# Patient Record
Sex: Male | Born: 1955 | Race: White | Hispanic: No | Marital: Single | Smoking: Current every day smoker
Health system: Southern US, Community
[De-identification: ages and names within clinical notes are randomized; demographics above are authoritative.]

## PROBLEM LIST (undated history)

## (undated) DIAGNOSIS — K219 Gastro-esophageal reflux disease without esophagitis: Secondary | ICD-10-CM

## (undated) DIAGNOSIS — R51 Headache: Secondary | ICD-10-CM

## (undated) DIAGNOSIS — Z860100 Personal history of colon polyps, unspecified: Secondary | ICD-10-CM

## (undated) DIAGNOSIS — R0601 Orthopnea: Secondary | ICD-10-CM

## (undated) DIAGNOSIS — H9190 Unspecified hearing loss, unspecified ear: Secondary | ICD-10-CM

## (undated) DIAGNOSIS — R1310 Dysphagia, unspecified: Secondary | ICD-10-CM

## (undated) DIAGNOSIS — J42 Unspecified chronic bronchitis: Secondary | ICD-10-CM

## (undated) DIAGNOSIS — Z8659 Personal history of other mental and behavioral disorders: Secondary | ICD-10-CM

## (undated) DIAGNOSIS — J439 Emphysema, unspecified: Secondary | ICD-10-CM

## (undated) DIAGNOSIS — F329 Major depressive disorder, single episode, unspecified: Secondary | ICD-10-CM

## (undated) DIAGNOSIS — R519 Headache, unspecified: Secondary | ICD-10-CM

## (undated) DIAGNOSIS — M1990 Unspecified osteoarthritis, unspecified site: Secondary | ICD-10-CM

## (undated) DIAGNOSIS — S02401A Maxillary fracture, unspecified, initial encounter for closed fracture: Secondary | ICD-10-CM

## (undated) DIAGNOSIS — R918 Other nonspecific abnormal finding of lung field: Secondary | ICD-10-CM

## (undated) DIAGNOSIS — K2270 Barrett's esophagus without dysplasia: Secondary | ICD-10-CM

## (undated) DIAGNOSIS — R42 Dizziness and giddiness: Secondary | ICD-10-CM

## (undated) DIAGNOSIS — I10 Essential (primary) hypertension: Secondary | ICD-10-CM

## (undated) DIAGNOSIS — F419 Anxiety disorder, unspecified: Secondary | ICD-10-CM

## (undated) DIAGNOSIS — IMO0001 Reserved for inherently not codable concepts without codable children: Secondary | ICD-10-CM

## (undated) DIAGNOSIS — C801 Malignant (primary) neoplasm, unspecified: Secondary | ICD-10-CM

## (undated) DIAGNOSIS — N19 Unspecified kidney failure: Secondary | ICD-10-CM

## (undated) DIAGNOSIS — I739 Peripheral vascular disease, unspecified: Secondary | ICD-10-CM

## (undated) DIAGNOSIS — I82409 Acute embolism and thrombosis of unspecified deep veins of unspecified lower extremity: Secondary | ICD-10-CM

## (undated) DIAGNOSIS — F32A Depression, unspecified: Secondary | ICD-10-CM

## (undated) DIAGNOSIS — I83813 Varicose veins of bilateral lower extremities with pain: Secondary | ICD-10-CM

## (undated) DIAGNOSIS — R569 Unspecified convulsions: Secondary | ICD-10-CM

## (undated) DIAGNOSIS — R059 Cough, unspecified: Secondary | ICD-10-CM

## (undated) DIAGNOSIS — J449 Chronic obstructive pulmonary disease, unspecified: Secondary | ICD-10-CM

## (undated) DIAGNOSIS — R05 Cough: Secondary | ICD-10-CM

## (undated) DIAGNOSIS — T7840XA Allergy, unspecified, initial encounter: Secondary | ICD-10-CM

## (undated) DIAGNOSIS — R0902 Hypoxemia: Secondary | ICD-10-CM

## (undated) DIAGNOSIS — J189 Pneumonia, unspecified organism: Secondary | ICD-10-CM

## (undated) DIAGNOSIS — Z8719 Personal history of other diseases of the digestive system: Secondary | ICD-10-CM

## (undated) DIAGNOSIS — R490 Dysphonia: Secondary | ICD-10-CM

## (undated) DIAGNOSIS — R062 Wheezing: Secondary | ICD-10-CM

## (undated) DIAGNOSIS — Z8709 Personal history of other diseases of the respiratory system: Secondary | ICD-10-CM

## (undated) HISTORY — DX: Peripheral vascular disease, unspecified: I73.9

## (undated) HISTORY — DX: Anxiety disorder, unspecified: F41.9

## (undated) HISTORY — PX: EYE SURGERY: SHX253

## (undated) HISTORY — DX: Varicose veins of bilateral lower extremities with pain: I83.813

## (undated) HISTORY — DX: Personal history of other mental and behavioral disorders: Z86.59

## (undated) HISTORY — DX: Allergy, unspecified, initial encounter: T78.40XA

## (undated) HISTORY — DX: Essential (primary) hypertension: I10

---

## 1978-08-30 HISTORY — PX: FACIAL RECONSTRUCTION SURGERY: SHX631

## 2005-03-22 ENCOUNTER — Emergency Department: Payer: Self-pay | Admitting: Emergency Medicine

## 2005-03-25 ENCOUNTER — Ambulatory Visit: Payer: Self-pay | Admitting: Emergency Medicine

## 2005-04-08 ENCOUNTER — Ambulatory Visit: Payer: Self-pay | Admitting: Internal Medicine

## 2005-04-14 ENCOUNTER — Ambulatory Visit: Payer: Self-pay | Admitting: Internal Medicine

## 2005-06-21 ENCOUNTER — Other Ambulatory Visit: Payer: Self-pay

## 2005-06-21 ENCOUNTER — Inpatient Hospital Stay: Payer: Self-pay | Admitting: Internal Medicine

## 2005-08-25 ENCOUNTER — Ambulatory Visit (HOSPITAL_COMMUNITY): Admission: RE | Admit: 2005-08-25 | Discharge: 2005-08-25 | Payer: Self-pay | Admitting: Family Medicine

## 2008-08-31 ENCOUNTER — Inpatient Hospital Stay: Payer: Self-pay | Admitting: Internal Medicine

## 2008-11-06 ENCOUNTER — Ambulatory Visit: Payer: Self-pay | Admitting: Nurse Practitioner

## 2008-12-20 ENCOUNTER — Ambulatory Visit: Payer: Self-pay | Admitting: Gastroenterology

## 2011-10-25 ENCOUNTER — Ambulatory Visit: Payer: Self-pay | Admitting: Internal Medicine

## 2011-12-16 ENCOUNTER — Ambulatory Visit: Payer: Self-pay | Admitting: Gastroenterology

## 2012-01-05 ENCOUNTER — Ambulatory Visit: Payer: Self-pay | Admitting: Gastroenterology

## 2013-01-31 ENCOUNTER — Emergency Department: Payer: Self-pay | Admitting: Emergency Medicine

## 2013-01-31 LAB — COMPREHENSIVE METABOLIC PANEL
Albumin: 3.4 g/dL (ref 3.4–5.0)
Anion Gap: 7 (ref 7–16)
Bilirubin,Total: 0.5 mg/dL (ref 0.2–1.0)
Calcium, Total: 9 mg/dL (ref 8.5–10.1)
EGFR (African American): >60
EGFR (Non-African Amer.): >60
Potassium: 2.6 mmol/L — ABNORMAL LOW (ref 3.5–5.1)
Sodium: 137 mmol/L (ref 136–145)

## 2013-01-31 LAB — CBC
HCT: 45.6 % (ref 40.0–52.0)
MCH: 31.6 pg (ref 26.0–34.0)
MCV: 92 fL (ref 80–100)
RBC: 4.94 10*6/uL (ref 4.40–5.90)
RDW: 15.2 % — ABNORMAL HIGH (ref 11.5–14.5)
WBC: 15.4 10*3/uL — ABNORMAL HIGH (ref 3.8–10.6)

## 2013-01-31 LAB — URINALYSIS, COMPLETE
Bacteria: NONE SEEN
Glucose,UR: NEGATIVE mg/dL (ref 0–75)
Ketone: NEGATIVE
Protein: NEGATIVE

## 2013-01-31 LAB — LIPASE, BLOOD: Lipase: 232 U/L (ref 73–393)

## 2013-10-05 ENCOUNTER — Other Ambulatory Visit: Payer: Self-pay | Admitting: Gastroenterology

## 2013-10-05 LAB — CLOSTRIDIUM DIFFICILE(ARMC)

## 2013-10-09 LAB — STOOL CULTURE

## 2013-10-30 ENCOUNTER — Ambulatory Visit: Payer: Self-pay | Admitting: Gastroenterology

## 2013-11-01 ENCOUNTER — Ambulatory Visit: Payer: Self-pay | Admitting: Internal Medicine

## 2013-11-01 LAB — PATHOLOGY REPORT

## 2014-01-31 ENCOUNTER — Inpatient Hospital Stay: Payer: Self-pay

## 2014-01-31 LAB — COMPREHENSIVE METABOLIC PANEL
ALBUMIN: 2.1 g/dL — AB (ref 3.4–5.0)
ALK PHOS: 79 U/L
ANION GAP: 5 — AB (ref 7–16)
BILIRUBIN TOTAL: 0.8 mg/dL (ref 0.2–1.0)
BUN: 22 mg/dL — AB (ref 7–18)
CALCIUM: 8.3 mg/dL — AB (ref 8.5–10.1)
Chloride: 89 mmol/L — ABNORMAL LOW (ref 98–107)
Co2: 40 mmol/L (ref 21–32)
Creatinine: 1.01 mg/dL (ref 0.60–1.30)
EGFR (African American): >60
EGFR (Non-African Amer.): >60
GLUCOSE: 147 mg/dL — AB (ref 65–99)
Osmolality: 274 (ref 275–301)
POTASSIUM: 2.3 mmol/L — AB (ref 3.5–5.1)
SGOT(AST): 22 U/L (ref 15–37)
SGPT (ALT): 12 U/L (ref 12–78)
SODIUM: 134 mmol/L — AB (ref 136–145)
TOTAL PROTEIN: 7.2 g/dL (ref 6.4–8.2)

## 2014-01-31 LAB — CBC
HCT: 40.7 % (ref 40.0–52.0)
HGB: 13.5 g/dL (ref 13.0–18.0)
MCH: 30.5 pg (ref 26.0–34.0)
MCHC: 33.2 g/dL (ref 32.0–36.0)
MCV: 92 fL (ref 80–100)
Platelet: 258 10*3/uL (ref 150–440)
RBC: 4.43 10*6/uL (ref 4.40–5.90)
RDW: 14.9 % — AB (ref 11.5–14.5)
WBC: 27.8 10*3/uL — AB (ref 3.8–10.6)

## 2014-01-31 LAB — PROTIME-INR
INR: 1.2
Prothrombin Time: 14.9 secs — ABNORMAL HIGH (ref 11.5–14.7)

## 2014-01-31 LAB — MAGNESIUM: Magnesium: 2.9 mg/dL — ABNORMAL HIGH

## 2014-01-31 LAB — TROPONIN I: Troponin-I: 0.04 ng/mL

## 2014-01-31 LAB — PRO B NATRIURETIC PEPTIDE: B-TYPE NATIURETIC PEPTID: 6953 pg/mL — AB (ref 0–125)

## 2014-02-01 LAB — CBC WITH DIFFERENTIAL/PLATELET
BASOS ABS: 0 10*3/uL (ref 0.0–0.1)
BASOS PCT: 0.1 %
EOS ABS: 0 10*3/uL (ref 0.0–0.7)
EOS PCT: 0.1 %
HCT: 36.7 % — AB (ref 40.0–52.0)
HGB: 11.7 g/dL — ABNORMAL LOW (ref 13.0–18.0)
LYMPHS ABS: 1.2 10*3/uL (ref 1.0–3.6)
LYMPHS PCT: 5.6 %
MCH: 29.7 pg (ref 26.0–34.0)
MCHC: 32 g/dL (ref 32.0–36.0)
MCV: 93 fL (ref 80–100)
MONO ABS: 0.8 x10 3/mm (ref 0.2–1.0)
MONOS PCT: 3.7 %
NEUTROS ABS: 20.1 10*3/uL — AB (ref 1.4–6.5)
NEUTROS PCT: 90.5 %
Platelet: 121 10*3/uL — ABNORMAL LOW (ref 150–440)
RBC: 3.95 10*6/uL — AB (ref 4.40–5.90)
RDW: 14.7 % — AB (ref 11.5–14.5)
WBC: 22.2 10*3/uL — AB (ref 3.8–10.6)

## 2014-02-01 LAB — BASIC METABOLIC PANEL
ANION GAP: 5 — AB (ref 7–16)
BUN: 20 mg/dL — AB (ref 7–18)
Calcium, Total: 7.8 mg/dL — ABNORMAL LOW (ref 8.5–10.1)
Chloride: 94 mmol/L — ABNORMAL LOW (ref 98–107)
Co2: 38 mmol/L — ABNORMAL HIGH (ref 21–32)
Creatinine: 1.05 mg/dL (ref 0.60–1.30)
Glucose: 151 mg/dL — ABNORMAL HIGH (ref 65–99)
OSMOLALITY: 279 (ref 275–301)
POTASSIUM: 3.1 mmol/L — AB (ref 3.5–5.1)
SODIUM: 137 mmol/L (ref 136–145)

## 2014-02-01 LAB — RAPID HIV-1/2 QL/CONFIRM: HIV-1/2, RAPID QL: NEGATIVE

## 2014-02-01 LAB — MAGNESIUM: Magnesium: 3.3 mg/dL — ABNORMAL HIGH

## 2014-02-02 LAB — LACTATE DEHYDROGENASE: LDH: 223 U/L (ref 85–241)

## 2014-02-02 LAB — BASIC METABOLIC PANEL
Anion Gap: 3 — ABNORMAL LOW (ref 7–16)
BUN: 20 mg/dL — AB (ref 7–18)
CALCIUM: 8.1 mg/dL — AB (ref 8.5–10.1)
CO2: 37 mmol/L — AB (ref 21–32)
CREATININE: 0.97 mg/dL (ref 0.60–1.30)
Chloride: 99 mmol/L (ref 98–107)
EGFR (African American): >60
GLUCOSE: 158 mg/dL — AB (ref 65–99)
OSMOLALITY: 283 (ref 275–301)
POTASSIUM: 3.2 mmol/L — AB (ref 3.5–5.1)
Sodium: 139 mmol/L (ref 136–145)

## 2014-02-02 LAB — CBC WITH DIFFERENTIAL/PLATELET
Bands: 7 %
HCT: 36.9 % — AB (ref 40.0–52.0)
HGB: 11.7 g/dL — AB (ref 13.0–18.0)
Lymphocytes: 10 %
MCH: 29.4 pg (ref 26.0–34.0)
MCHC: 31.6 g/dL — AB (ref 32.0–36.0)
MCV: 93 fL (ref 80–100)
MYELOCYTE: 2 %
Metamyelocyte: 1 %
NRBC/100 WBC: 3 /
PLATELETS: 90 10*3/uL — AB (ref 150–440)
RBC: 3.97 10*6/uL — AB (ref 4.40–5.90)
RDW: 14.9 % — AB (ref 11.5–14.5)
SEGMENTED NEUTROPHILS: 80 %
WBC: 20.3 10*3/uL — AB (ref 3.8–10.6)

## 2014-02-03 LAB — CBC WITH DIFFERENTIAL/PLATELET
BASOS ABS: 0 10*3/uL (ref 0.0–0.1)
BASOS PCT: 0.1 %
EOS ABS: 0 10*3/uL (ref 0.0–0.7)
EOS PCT: 0 %
HCT: 35.1 % — AB (ref 40.0–52.0)
HGB: 11.2 g/dL — ABNORMAL LOW (ref 13.0–18.0)
LYMPHS PCT: 3.9 %
Lymphocyte #: 0.5 10*3/uL — ABNORMAL LOW (ref 1.0–3.6)
MCH: 29.7 pg (ref 26.0–34.0)
MCHC: 32 g/dL (ref 32.0–36.0)
MCV: 93 fL (ref 80–100)
MONO ABS: 0.5 x10 3/mm (ref 0.2–1.0)
MONOS PCT: 3.4 %
Neutrophil #: 12.7 10*3/uL — ABNORMAL HIGH (ref 1.4–6.5)
Neutrophil %: 92.6 %
PLATELETS: 84 10*3/uL — AB (ref 150–440)
RBC: 3.78 10*6/uL — AB (ref 4.40–5.90)
RDW: 14.7 % — AB (ref 11.5–14.5)
WBC: 13.7 10*3/uL — ABNORMAL HIGH (ref 3.8–10.6)

## 2014-02-03 LAB — COMPREHENSIVE METABOLIC PANEL
AST: 15 U/L (ref 15–37)
Albumin: 1.9 g/dL — ABNORMAL LOW (ref 3.4–5.0)
Alkaline Phosphatase: 49 U/L
Anion Gap: 5 — ABNORMAL LOW (ref 7–16)
BILIRUBIN TOTAL: 0.3 mg/dL (ref 0.2–1.0)
BUN: 18 mg/dL (ref 7–18)
CO2: 36 mmol/L — AB (ref 21–32)
Calcium, Total: 7.9 mg/dL — ABNORMAL LOW (ref 8.5–10.1)
Chloride: 101 mmol/L (ref 98–107)
Creatinine: 0.69 mg/dL (ref 0.60–1.30)
EGFR (African American): >60
EGFR (Non-African Amer.): >60
GLUCOSE: 168 mg/dL — AB (ref 65–99)
Osmolality: 289 (ref 275–301)
Potassium: 3.5 mmol/L (ref 3.5–5.1)
SGPT (ALT): 16 U/L (ref 12–78)
SODIUM: 142 mmol/L (ref 136–145)
TOTAL PROTEIN: 5.5 g/dL — AB (ref 6.4–8.2)

## 2014-02-04 LAB — BASIC METABOLIC PANEL
ANION GAP: 5 — AB (ref 7–16)
BUN: 17 mg/dL (ref 7–18)
CO2: 36 mmol/L — AB (ref 21–32)
Calcium, Total: 7.9 mg/dL — ABNORMAL LOW (ref 8.5–10.1)
Chloride: 101 mmol/L (ref 98–107)
Creatinine: 0.87 mg/dL (ref 0.60–1.30)
EGFR (Non-African Amer.): >60
Glucose: 194 mg/dL — ABNORMAL HIGH (ref 65–99)
Osmolality: 290 (ref 275–301)
Potassium: 3.8 mmol/L (ref 3.5–5.1)
SODIUM: 142 mmol/L (ref 136–145)

## 2014-02-04 LAB — CBC WITH DIFFERENTIAL/PLATELET
BASOS ABS: 0 10*3/uL (ref 0.0–0.1)
BASOS PCT: 0.1 %
EOS PCT: 0 %
Eosinophil #: 0 10*3/uL (ref 0.0–0.7)
HCT: 35.5 % — AB (ref 40.0–52.0)
HGB: 11.4 g/dL — ABNORMAL LOW (ref 13.0–18.0)
Lymphocyte #: 0.5 10*3/uL — ABNORMAL LOW (ref 1.0–3.6)
Lymphocyte %: 3.5 %
MCH: 29.6 pg (ref 26.0–34.0)
MCHC: 32.1 g/dL (ref 32.0–36.0)
MCV: 92 fL (ref 80–100)
MONO ABS: 0.4 x10 3/mm (ref 0.2–1.0)
Monocyte %: 3.1 %
NEUTROS PCT: 93.3 %
Neutrophil #: 13.1 10*3/uL — ABNORMAL HIGH (ref 1.4–6.5)
PLATELETS: 99 10*3/uL — AB (ref 150–440)
RBC: 3.85 10*6/uL — AB (ref 4.40–5.90)
RDW: 14.7 % — AB (ref 11.5–14.5)
WBC: 14.1 10*3/uL — AB (ref 3.8–10.6)

## 2014-02-05 LAB — CULTURE, BLOOD (SINGLE)

## 2014-02-05 LAB — THEOPHYLLINE LEVEL: Theophylline: 4.5 ug/mL — ABNORMAL LOW (ref 10.0–20.0)

## 2014-02-06 LAB — EXPECTORATED SPUTUM ASSESSMENT W REFEX TO RESP CULTURE

## 2014-02-07 LAB — BASIC METABOLIC PANEL
ANION GAP: 6 — AB (ref 7–16)
BUN: 21 mg/dL — ABNORMAL HIGH (ref 7–18)
Calcium, Total: 8.3 mg/dL — ABNORMAL LOW (ref 8.5–10.1)
Chloride: 100 mmol/L (ref 98–107)
Co2: 32 mmol/L (ref 21–32)
Creatinine: 0.75 mg/dL (ref 0.60–1.30)
EGFR (African American): >60
EGFR (Non-African Amer.): >60
Glucose: 272 mg/dL — ABNORMAL HIGH (ref 65–99)
Osmolality: 288 (ref 275–301)
POTASSIUM: 4 mmol/L (ref 3.5–5.1)
Sodium: 138 mmol/L (ref 136–145)

## 2014-02-07 LAB — CBC WITH DIFFERENTIAL/PLATELET
BASOS PCT: 0.1 %
Basophil #: 0 10*3/uL (ref 0.0–0.1)
EOS ABS: 0 10*3/uL (ref 0.0–0.7)
Eosinophil %: 0 %
HCT: 39.5 % — AB (ref 40.0–52.0)
HGB: 12.5 g/dL — ABNORMAL LOW (ref 13.0–18.0)
Lymphocyte #: 0.5 10*3/uL — ABNORMAL LOW (ref 1.0–3.6)
Lymphocyte %: 2 %
MCH: 29.2 pg (ref 26.0–34.0)
MCHC: 31.6 g/dL — ABNORMAL LOW (ref 32.0–36.0)
MCV: 93 fL (ref 80–100)
MONO ABS: 0.9 x10 3/mm (ref 0.2–1.0)
MONOS PCT: 3.4 %
NEUTROS ABS: 25.6 10*3/uL — AB (ref 1.4–6.5)
Neutrophil %: 94.5 %
PLATELETS: 134 10*3/uL — AB (ref 150–440)
RBC: 4.27 10*6/uL — AB (ref 4.40–5.90)
RDW: 14.9 % — AB (ref 11.5–14.5)
WBC: 27.1 10*3/uL — AB (ref 3.8–10.6)

## 2014-02-08 LAB — URINALYSIS, COMPLETE
BACTERIA: NONE SEEN
BILIRUBIN, UR: NEGATIVE
Blood: NEGATIVE
Glucose,UR: 150 mg/dL (ref 0–75)
Ketone: NEGATIVE
Leukocyte Esterase: NEGATIVE
Nitrite: NEGATIVE
PH: 6 (ref 4.5–8.0)
PROTEIN: NEGATIVE
RBC,UR: <1 /HPF (ref 0–5)
SPECIFIC GRAVITY: 1.016 (ref 1.003–1.030)
SQUAMOUS EPITHELIAL: NONE SEEN

## 2014-02-09 LAB — CBC WITH DIFFERENTIAL/PLATELET
BASOS ABS: 0 10*3/uL (ref 0.0–0.1)
BASOS PCT: 0.1 %
EOS PCT: 0 %
Eosinophil #: 0 10*3/uL (ref 0.0–0.7)
HCT: 39.3 % — AB (ref 40.0–52.0)
HGB: 12.8 g/dL — ABNORMAL LOW (ref 13.0–18.0)
LYMPHS ABS: 0.7 10*3/uL — AB (ref 1.0–3.6)
LYMPHS PCT: 2.6 %
MCH: 30.1 pg (ref 26.0–34.0)
MCHC: 32.6 g/dL (ref 32.0–36.0)
MCV: 92 fL (ref 80–100)
MONO ABS: 1 x10 3/mm (ref 0.2–1.0)
Monocyte %: 3.5 %
NEUTROS ABS: 26.1 10*3/uL — AB (ref 1.4–6.5)
NEUTROS PCT: 93.8 %
PLATELETS: 148 10*3/uL — AB (ref 150–440)
RBC: 4.26 10*6/uL — ABNORMAL LOW (ref 4.40–5.90)
RDW: 14.9 % — AB (ref 11.5–14.5)
WBC: 27.8 10*3/uL — ABNORMAL HIGH (ref 3.8–10.6)

## 2014-02-09 LAB — URINE CULTURE

## 2014-02-10 LAB — CBC WITH DIFFERENTIAL/PLATELET
BASOS PCT: 0.1 %
Basophil #: 0 10*3/uL (ref 0.0–0.1)
Eosinophil #: 0 10*3/uL (ref 0.0–0.7)
Eosinophil %: 0 %
HCT: 39.1 % — ABNORMAL LOW (ref 40.0–52.0)
HGB: 12.5 g/dL — AB (ref 13.0–18.0)
LYMPHS ABS: 0.7 10*3/uL — AB (ref 1.0–3.6)
Lymphocyte %: 3.3 %
MCH: 29.4 pg (ref 26.0–34.0)
MCHC: 31.8 g/dL — AB (ref 32.0–36.0)
MCV: 92 fL (ref 80–100)
MONO ABS: 0.7 x10 3/mm (ref 0.2–1.0)
Monocyte %: 3.4 %
Neutrophil #: 20.5 10*3/uL — ABNORMAL HIGH (ref 1.4–6.5)
Neutrophil %: 93.2 %
Platelet: 151 10*3/uL (ref 150–440)
RBC: 4.24 10*6/uL — ABNORMAL LOW (ref 4.40–5.90)
RDW: 14.6 % — AB (ref 11.5–14.5)
WBC: 22 10*3/uL — ABNORMAL HIGH (ref 3.8–10.6)

## 2014-02-10 LAB — BASIC METABOLIC PANEL
ANION GAP: 7 (ref 7–16)
BUN: 21 mg/dL — AB (ref 7–18)
CALCIUM: 8.2 mg/dL — AB (ref 8.5–10.1)
CO2: 31 mmol/L (ref 21–32)
CREATININE: 0.61 mg/dL (ref 0.60–1.30)
Chloride: 98 mmol/L (ref 98–107)
EGFR (African American): >60
GLUCOSE: 181 mg/dL — AB (ref 65–99)
OSMOLALITY: 280 (ref 275–301)
Potassium: 4.6 mmol/L (ref 3.5–5.1)
SODIUM: 136 mmol/L (ref 136–145)

## 2014-02-21 DIAGNOSIS — J439 Emphysema, unspecified: Secondary | ICD-10-CM | POA: Insufficient documentation

## 2014-02-22 DIAGNOSIS — I10 Essential (primary) hypertension: Secondary | ICD-10-CM | POA: Insufficient documentation

## 2014-02-22 DIAGNOSIS — K219 Gastro-esophageal reflux disease without esophagitis: Secondary | ICD-10-CM | POA: Insufficient documentation

## 2014-06-05 DIAGNOSIS — I739 Peripheral vascular disease, unspecified: Secondary | ICD-10-CM

## 2014-06-05 DIAGNOSIS — Z8659 Personal history of other mental and behavioral disorders: Secondary | ICD-10-CM

## 2014-06-05 HISTORY — DX: Peripheral vascular disease, unspecified: I73.9

## 2014-06-05 HISTORY — DX: Personal history of other mental and behavioral disorders: Z86.59

## 2014-12-21 NOTE — Discharge Summary (Signed)
PATIENT NAME:  Steve Gregory, Steve Gregory MR#:  [id number] DATE OF BIRTH:  [birth year]  DATE OF ADMISSION:  01/31/2014 DATE OF DISCHARGE:  02/10/2014  PRIMARY CARE PROVIDER: Ocie Cornfield. Ouida Sills, MD  CONSULTANTS:  Wallene Huh, MD. pulmonary.   DISCHARGE DIAGNOSES: 1.  Acute respiratory failure.  2.  Chronic obstructive pulmonary disease exacerbation.  3.  Pneumonia.  4.  Dehydration.  5.  Tobacco dependence.   DISCHARGE MEDICATIONS: 1.  DuoNebs inhalation 1 puff q.6 hours as needed.  2.  ProAir 90 mcg inhaler 2 puffs q.i.d. as needed.  3.  Singulair 10 mg daily.  4.  Pantoprazole extended-release 40 mg daily.  5.  Carvedilol 6.25 mg b.i.d.  6.  Losartan 25 mg daily.  7.  Theophylline extended-release 200 mg twice daily.  8.  Fluticasone 1 puff twice daily.  9.  Tiotropium 18 mcg inhaler 1 capsule daily.  10.  Levaquin 500 mg daily.  11.  Prednisone 10 mg taper.   HISTORY OF PRESENT ILLNESS: This is a 59 year old male with a history of Pseudomonas pneumonia, chronic obstructive pulmonary disease, tobacco dependence, who presented with shortness of breath, cough and wheeze. He had been on antibiotics, however, had not been improving. Symptoms had been ongoing for about 1 week. Please see the dictated history and physical for further details.   HOSPITAL COURSE: The patient was admitted for acute respiratory failure, pneumonia, and chronic obstructive pulmonary disease exacerbation. Notable labs on admission included an elevated white cell count of 27.8, low potassium of 2.3, elevated BNP of 6953 and ABG showed a pH of 7.51, pCO2 53, PaO2 52 and FI O2 of 36%. Chest x-ray was notable for diffuse interstitial alveolar opacity in the lungs. He was initiated on albuterol, IV steroids, antibiotics and oxygen. As he was felt to be dehydrated and was mildly hyponatremic with a serum sodium of 134, he was initiated on IV fluids. He was counseled to quit smoking and nicotine patch was placed. He was admitted  to the medicine floor and initiated on IV Solu-Medrol as well as DuoNebs around the clock. Antibiotics chosen included Levaquin and Zosyn due to a history of Pseudomonas pneumonia. Serial chest x-rays over the course of his hospitalization showed severe chronic emphysematous lung changes with superimposed right middle lobe, right upper lobe and left upper lobe opacities consistent with multilobar pneumonia. Pulmonary was consulted on February 03, 2014. The patient was continued on his same regimen, however, Theo-Dur 200 mg q.12 hours was added. He was noted to have mild thrombocytopenia. Initially, Lovenox was used; however, this was stopped when his platelet count was low. Platelet count remained stable throughout his course. The patient was slow to clinically improve. On the day of discharge his oxygen requirement was down to 4 liters per day. With continued use of steroids, antibiotics and further supportive care, he had gradual clinical recovery. He was discharged in stable condition on June 14th with home oxygen.  DISCHARGE INSTRUCTIONS:  1.  The patient should follow up with primary care provider in 1 to 2 weeks.  2.  Patient should follow up with pulmonary in 1 to 2 weeks.  3.  The patient should complete course of antibiotics for a total of 14 days.  4.  The patient should complete prednisone taper and then resume his outpatient prior dosing of steroids, as he is prednisone-dependent.   ____________________________ A. Lavone Orn, MD ams:cs D: 02/10/2014 09:05:00 ET T: 02/10/2014 14:46:23 ET JOB#: [id number]  cc: Ocie Cornfield. Ouida Sills, MD A. Melissa  Solum, MD, <Dictator> Herbon E. Raul Del, MD    Gracy Bruins SOLUM MD ELECTRONICALLY SIGNED 02/11/2014 17:13

## 2014-12-21 NOTE — H&P (Signed)
PATIENT NAME:  Steve Gregory, SOLEM MR#:  [id number] DATE OF BIRTH:  1956/02/05  DATE OF ADMISSION:  01/31/2014  PRIMARY CARE PHYSICIAN:  Dr. Frazier Richards.   Referring PHYSICIAN:  Dr. Jasmine December.  CHIEF COMPLAINT: Cough, shortness of breath, wheezing for 1 week.   HISTORY OF PRESENT ILLNESS: A 59 year old Caucasian male with a history of COPD, Pseudomonas pneumonia comes to the ED due to shortness of breath, cough and wheezing for 1 week. The patient is alert, awake, mild confusion, in no acute distress. The patient said that he has had a productive cough and wheezing for the past 1 week. Symptoms have been worsening. He used a nebulizer without improvement, so he came to the ED for further evaluation. The patient's O2 sat was 74% in the ED.  He is put on oxygen by nasal cannula 4 liters. The patient was treated with Decadron, albuterol, Levaquin and magnesium. In addition, the patient's potassium is low at 2.3, given potassium IV now.   PAST MEDICAL HISTORY: COPD, previous Pseudomonas pneumonia, Barrett esophagus, fracture of left maxilla.   SOCIAL HISTORY: The patient smokes 1 pack a day for many years. He said he could not smoke due to chest pain while coughing. Denies alcohol drinking. But according to previous document, the patient has alcohol abuse. The patient denies any drug abuse.   PAST SURGICAL HISTORY:  The patient denies.  FAMILY HISTORY: Mother has hypertension, high cholesterol and PUD. Father had hypertension, diabetes, CVA.   ALLERGIES: None.  HOME MEDICATIONS:  Spiriva 18 mcg 1 cap once a day, Singulair 10 mg p.o. daily, prednisone 5 mg p.o. daily, Nexium 40 mg p.o. daily, Mucinex 600 mg p.o. b.i.d. for 5 days,  K-Tab 20 mEq p.o. tablets once a day, DuoNeb 1 puff inhaled every 6 hours, Augmentin 875 mg p.o. b.i.d., albuterol CFC free 90 mcg inhalation 2 puffs every 4 hours p.r.n., Advair Diskus 250 mcg/50 mcg 1 puff once a day.  REVIEW OF SYSTEMS:   CONSTITUTIONAL: The patient  denies any fever or chills. No headache or dizziness, but has weakness.  EYES: No double vision or blurry vision.  ENT: No postnasal drip, slurred speech or dysphagia.  CARDIOVASCULAR: Positive for chest pain, little coughing. No palpitations, orthopnea, nocturnal dyspnea. No leg edema.  PULMONARY: Positive for cough, sputum, shortness of breath, wheezing. No hemoptysis.  GASTROINTESTINAL: No abdominal pain, nausea, vomiting, diarrhea. No melena.  No bloody stool.  GENITOURINARY: No dysuria, hematuria or incontinence.  SKIN: No rash or jaundice.  NEUROLOGIC: No syncope, loss of consciousness or seizure.  HEMATOLOGY: No easy bruising or bleeding.  ENDOCRINE: No polyuria, polydipsia, heat or cold intolerance.   PHYSICAL EXAMINATION: VITAL SIGNS: Temperature 98.1, blood pressure 134/73, pulse 90, O2 saturation 80% on room air, and now 4 liters by nasal cannula.  GENERAL:  The patient is alert, awake AO x 3 in no acute distress with mild use of accessory muscle to breathe. HEENT: Pupils round, equal and reactive to light and accommodation. NECK:  Supple. No JVD or carotid bruit. No lymphadenopathy. No thyromegaly.  Moist oral mucosa. Clear oropharynx.  CARDIOVASCULAR: S1, S2 regular rate, rhythm. No murmurs, rubs, gallops.  PULMONARY: Bilateral air entry with expiratory wheezing, crackles, mild use of accessory muscle to breathe.  ABDOMEN: Soft. No distention or tenderness. No organomegaly. Bowel sounds present.  EXTREMITIES: No edema, clubbing or cyanosis. No calf tenderness. Bilateral pedal pulses present.  SKIN: No rash or jaundice.  NEUROLOGY: AO x 3, but a little bit confused. Follow  commands. No focal deficit. Power 5/5. Sensation intact.   DIAGNOSTIC DATA:  Chest x-ray showed diffuse interstitial alveolar opacity in the lungs,  differential consideration of pulmonary edema or multifocal pneumonia. ABG showed pH of 7.51, pCO2 of 53 with pO2 of 52, FiO2 of 36%. INR 1.2. BNP 6953. WBC 27.8,  hemoglobin 13.5, platelets 258, glucose 147, BUN 22, creatinine 1.01. Sodium 134, potassium 2.3, chloride 89, bicarb 40, calcium 8.3. Troponin 0.04, magnesium 2.9.   IMPRESSIONS: 1.  Acute respiratory failure, hypoxia.  2.  Chronic obstructive pulmonary disease exacerbation.  3.  Pneumonia.  4.  Leukocytosis.  5.  Hypokalemia.  6.  Dehydration.  7.  Hyponatremia.  8.  Tobacco abuse.   PLAN OF TREATMENT:  1.  The patient will be admitted to medical floor. We will start Solu-Medrol, give DuoNeb around the clock. Start Levaquin and Zosyn due to history of Pseudomonas pneumonia. We will continue Advair, Spiriva and cough medication, Mucinex. 2.  Follow up blood culture, CBC, sputum culture.  3.  For mild dehydration and hyponatremia, we will give normal saline with potassium and follow up BNP.  4.  Smoking cessation. The patient was counseled for 3 to 4 minutes. Nicotine patch will be given.   Discussed the patient's condition and plan of treatment with the patient. The patient wants FULL CODE.   TIME SPENT: About 56 minutes.    ____________________________ Demetrios Loll, MD qc:ce D: 01/31/2014 14:54:20 ET T: 01/31/2014 16:20:54 ET JOB#: [id number]  cc: Demetrios Loll, MD, <Dictator> Demetrios Loll MD ELECTRONICALLY SIGNED 02/02/2014 16:06

## 2015-10-21 ENCOUNTER — Encounter: Payer: Self-pay | Admitting: *Deleted

## 2015-10-24 MED ORDER — ACETAMINOPHEN 60 MG HALF SUPP: 10.0000 mg/kg | Freq: Once | RECTAL | Status: DC

## 2015-10-24 MED ORDER — ACETAMINOPHEN 160 MG/5ML PO SOLN: 10.0000 mg/kg | Freq: Once | ORAL | Status: DC

## 2015-10-26 NOTE — H&P (Signed)
  See scanned notes

## 2015-10-27 ENCOUNTER — Ambulatory Visit
Admission: RE | Admit: 2015-10-27 | Discharge: 2015-10-27 | Disposition: A | Discharge status: Home / Self Care | Payer: Medicare Other | Source: Ambulatory Visit | Attending: Ophthalmology | Admitting: Ophthalmology

## 2015-10-27 ENCOUNTER — Ambulatory Visit: Payer: Medicare Other | Admitting: Anesthesiology

## 2015-10-27 ENCOUNTER — Encounter
Admission: RE | Disposition: A | Discharge status: Home / Self Care | Payer: Self-pay | Source: Ambulatory Visit | Attending: Ophthalmology

## 2015-10-27 ENCOUNTER — Encounter: Payer: Self-pay | Admitting: *Deleted

## 2015-10-27 DIAGNOSIS — K219 Gastro-esophageal reflux disease without esophagitis: Secondary | ICD-10-CM | POA: Diagnosis not present

## 2015-10-27 DIAGNOSIS — J449 Chronic obstructive pulmonary disease, unspecified: Secondary | ICD-10-CM | POA: Insufficient documentation

## 2015-10-27 DIAGNOSIS — H2511 Age-related nuclear cataract, right eye: Secondary | ICD-10-CM | POA: Diagnosis not present

## 2015-10-27 DIAGNOSIS — H9190 Unspecified hearing loss, unspecified ear: Secondary | ICD-10-CM | POA: Diagnosis not present

## 2015-10-27 DIAGNOSIS — I10 Essential (primary) hypertension: Secondary | ICD-10-CM | POA: Diagnosis not present

## 2015-10-27 DIAGNOSIS — M1990 Unspecified osteoarthritis, unspecified site: Secondary | ICD-10-CM | POA: Diagnosis not present

## 2015-10-27 DIAGNOSIS — F329 Major depressive disorder, single episode, unspecified: Secondary | ICD-10-CM | POA: Insufficient documentation

## 2015-10-27 DIAGNOSIS — H268 Other specified cataract: Secondary | ICD-10-CM | POA: Diagnosis not present

## 2015-10-27 HISTORY — DX: Gastro-esophageal reflux disease without esophagitis: K21.9

## 2015-10-27 HISTORY — DX: Essential (primary) hypertension: I10

## 2015-10-27 HISTORY — DX: Depression, unspecified: F32.A

## 2015-10-27 HISTORY — DX: Unspecified osteoarthritis, unspecified site: M19.90

## 2015-10-27 HISTORY — DX: Hypoxemia: R09.02

## 2015-10-27 HISTORY — DX: Major depressive disorder, single episode, unspecified: F32.9

## 2015-10-27 HISTORY — PX: CATARACT EXTRACTION W/PHACO: SHX586

## 2015-10-27 HISTORY — DX: Reserved for inherently not codable concepts without codable children: IMO0001

## 2015-10-27 HISTORY — DX: Wheezing: R06.2

## 2015-10-27 HISTORY — DX: Orthopnea: R06.01

## 2015-10-27 HISTORY — DX: Headache: R51

## 2015-10-27 HISTORY — DX: Personal history of other diseases of the digestive system: Z87.19

## 2015-10-27 HISTORY — DX: Headache, unspecified: R51.9

## 2015-10-27 HISTORY — DX: Unspecified convulsions: R56.9

## 2015-10-27 HISTORY — DX: Dizziness and giddiness: R42

## 2015-10-27 HISTORY — DX: Unspecified hearing loss, unspecified ear: H91.90

## 2015-10-27 HISTORY — DX: Chronic obstructive pulmonary disease, unspecified: J44.9

## 2015-10-27 HISTORY — DX: Cough, unspecified: R05.9

## 2015-10-27 HISTORY — DX: Cough: R05

## 2015-10-27 SURGERY — PHACOEMULSIFICATION, CATARACT, WITH IOL INSERTION
Anesthesia: Monitor Anesthesia Care | Site: Eye | Laterality: Right | Wound class: Clean

## 2015-10-27 MED ORDER — NA CHONDROIT SULF-NA HYALURON 40-17 MG/ML IO SOLN
INTRAOCULAR | Status: DC | PRN
  Administered 2015-10-27: 1 mL via INTRAOCULAR

## 2015-10-27 MED ORDER — CYCLOPENTOLATE HCL 2 % OP SOLN
OPHTHALMIC | Status: AC
  Administered 2015-10-27: 1 [drp] via OPHTHALMIC
  Filled 2015-10-27: qty 2

## 2015-10-27 MED ORDER — CARBACHOL 0.01 % IO SOLN
INTRAOCULAR | Status: DC | PRN
  Administered 2015-10-27: .5 mL via INTRAOCULAR

## 2015-10-27 MED ORDER — LIDOCAINE HCL (PF) 4 % IJ SOLN
INTRAMUSCULAR | Status: AC
  Filled 2015-10-27: qty 5

## 2015-10-27 MED ORDER — CYCLOPENTOLATE HCL 2 % OP SOLN
1.0000 [drp] | OPHTHALMIC | Status: AC | PRN
  Administered 2015-10-27 (×4): 1 [drp] via OPHTHALMIC

## 2015-10-27 MED ORDER — EPINEPHRINE HCL 1 MG/ML IJ SOLN
INTRAMUSCULAR | Status: AC
  Filled 2015-10-27: qty 2

## 2015-10-27 MED ORDER — BSS IO SOLN
INTRAOCULAR | Status: DC | PRN
  Administered 2015-10-27: .1 mL via OPHTHALMIC

## 2015-10-27 MED ORDER — LIDOCAINE HCL (PF) 4 % IJ SOLN
INTRAMUSCULAR | Status: DC | PRN
  Administered 2015-10-27: 4 mL via OPHTHALMIC

## 2015-10-27 MED ORDER — PHENYLEPHRINE HCL 10 % OP SOLN
1.0000 [drp] | OPHTHALMIC | Status: AC | PRN
  Administered 2015-10-27 (×4): 1 [drp] via OPHTHALMIC

## 2015-10-27 MED ORDER — MOXIFLOXACIN HCL 0.5 % OP SOLN
OPHTHALMIC | Status: AC
  Administered 2015-10-27: 1 [drp] via OPHTHALMIC
  Filled 2015-10-27: qty 3

## 2015-10-27 MED ORDER — CEFUROXIME OPHTHALMIC INJECTION 1 MG/0.1 ML
INJECTION | OPHTHALMIC | Status: AC
  Filled 2015-10-27: qty 0.1

## 2015-10-27 MED ORDER — MOXIFLOXACIN HCL 0.5 % OP SOLN
1.0000 [drp] | OPHTHALMIC | Status: AC | PRN
  Administered 2015-10-27 (×3): 1 [drp] via OPHTHALMIC

## 2015-10-27 MED ORDER — NA CHONDROIT SULF-NA HYALURON 40-17 MG/ML IO SOLN
INTRAOCULAR | Status: AC
  Filled 2015-10-27: qty 1

## 2015-10-27 MED ORDER — HYALURONIDASE HUMAN 150 UNIT/ML IJ SOLN
INTRAMUSCULAR | Status: AC
  Filled 2015-10-27: qty 1

## 2015-10-27 MED ORDER — EPINEPHRINE HCL 1 MG/ML IJ SOLN
INTRAOCULAR | Status: DC | PRN
  Administered 2015-10-27: 1 mL via OPHTHALMIC

## 2015-10-27 MED ORDER — CEFUROXIME OPHTHALMIC INJECTION 1 MG/0.1 ML
INJECTION | OPHTHALMIC | Status: DC | PRN
  Administered 2015-10-27: .1 mL via INTRACAMERAL

## 2015-10-27 MED ORDER — TETRACAINE HCL 0.5 % OP SOLN
OPHTHALMIC | Status: DC | PRN
  Administered 2015-10-27: 1 [drp] via OPHTHALMIC

## 2015-10-27 MED ORDER — BUPIVACAINE HCL (PF) 0.75 % IJ SOLN
INTRAMUSCULAR | Status: AC
  Filled 2015-10-27: qty 10

## 2015-10-27 MED ORDER — ALFENTANIL 500 MCG/ML IJ INJ
INJECTION | INTRAMUSCULAR | Status: DC | PRN
  Administered 2015-10-27: 500 ug via INTRAVENOUS

## 2015-10-27 MED ORDER — MIDAZOLAM HCL 2 MG/2ML IJ SOLN
INTRAMUSCULAR | Status: DC | PRN
  Administered 2015-10-27: 1 mg via INTRAVENOUS

## 2015-10-27 MED ORDER — PHENYLEPHRINE HCL 10 % OP SOLN
OPHTHALMIC | Status: AC
  Administered 2015-10-27: 1 [drp] via OPHTHALMIC
  Filled 2015-10-27: qty 5

## 2015-10-27 MED ORDER — TETRACAINE HCL 0.5 % OP SOLN
OPHTHALMIC | Status: AC
  Filled 2015-10-27: qty 2

## 2015-10-27 MED ORDER — SODIUM CHLORIDE 0.9 % IV SOLN
INTRAVENOUS | Status: DC
  Administered 2015-10-27: 50 mL/h via INTRAVENOUS

## 2015-10-27 MED ORDER — MOXIFLOXACIN HCL 0.5 % OP SOLN
OPHTHALMIC | Status: DC | PRN
  Administered 2015-10-27: 1 [drp] via OPHTHALMIC

## 2015-10-27 SURGICAL SUPPLY — 31 items
CANNULA ANT/CHMB 27G (MISCELLANEOUS) ×1 IMPLANT
CANNULA ANT/CHMB 27GA (MISCELLANEOUS) ×3 IMPLANT
CORD BIP STRL DISP 12FT (MISCELLANEOUS) ×3 IMPLANT
CUP MEDICINE 2OZ PLAST GRAD ST (MISCELLANEOUS) ×3 IMPLANT
DRAPE XRAY CASSETTE 23X24 (DRAPES) ×3 IMPLANT
ERASER HMR WETFIELD 18G (MISCELLANEOUS) ×3 IMPLANT
GLOVE BIO SURGEON STRL SZ8 (GLOVE) ×3 IMPLANT
GLOVE SURG LX 6.5 MICRO (GLOVE) ×2
GLOVE SURG LX 8.0 MICRO (GLOVE) ×2
GLOVE SURG LX STRL 6.5 MICRO (GLOVE) ×1 IMPLANT
GLOVE SURG LX STRL 8.0 MICRO (GLOVE) ×1 IMPLANT
GOWN STRL REUS W/ TWL LRG LVL3 (GOWN DISPOSABLE) ×1 IMPLANT
GOWN STRL REUS W/ TWL XL LVL3 (GOWN DISPOSABLE) ×1 IMPLANT
GOWN STRL REUS W/TWL LRG LVL3 (GOWN DISPOSABLE) ×3
GOWN STRL REUS W/TWL XL LVL3 (GOWN DISPOSABLE) ×3
LENS IOL ACRYSOF IQ 20.5 (Intraocular Lens) ×2 IMPLANT
PACK CATARACT (MISCELLANEOUS) ×3 IMPLANT
PACK CATARACT DINGLEDEIN LX (MISCELLANEOUS) ×3 IMPLANT
PACK EYE AFTER SURG (MISCELLANEOUS) ×3 IMPLANT
SHLD EYE VISITEC  UNIV (MISCELLANEOUS) ×3 IMPLANT
SOL BSS BAG (MISCELLANEOUS) ×3
SOL PREP PVP 2OZ (MISCELLANEOUS) ×3
SOLUTION BSS BAG (MISCELLANEOUS) ×1 IMPLANT
SOLUTION PREP PVP 2OZ (MISCELLANEOUS) ×1 IMPLANT
SUT ETHILON 10 0 CS140 6 (SUTURE) ×2 IMPLANT
SUT SILK 5-0 (SUTURE) ×3 IMPLANT
SYR 3ML LL SCALE MARK (SYRINGE) ×3 IMPLANT
SYR 5ML LL (SYRINGE) ×3 IMPLANT
SYR TB 1ML 27GX1/2 LL (SYRINGE) ×3 IMPLANT
WATER STERILE IRR 1000ML POUR (IV SOLUTION) ×3 IMPLANT
WIPE NON LINTING 3.25X3.25 (MISCELLANEOUS) ×3 IMPLANT

## 2015-10-27 NOTE — Discharge Instructions (Signed)
AMBULATORY SURGERY  DISCHARGE INSTRUCTIONS   1) The drugs that you were given will stay in your system until tomorrow so for the next 24 hours you should not:  A) Drive an automobile B) Make any legal decisions C) Drink any alcoholic beverage   2) You may resume regular meals tomorrow.  Today it is better to start with liquids and gradually work up to solid foods.  You may eat anything you prefer, but it is better to start with liquids, then soup and crackers, and gradually work up to solid foods.   3) Please notify your doctor immediately if you have any unusual bleeding, trouble breathing, redness and pain at the surgery site, drainage, fever, or pain not relieved by medication.    4) Additional Instructions:   Eye Surgery Discharge Instructions  Expect mild scratchy sensation or mild soreness. DO NOT RUB YOUR EYE!  The day of surgery:  Minimal physical activity, but bed rest is not required  No reading, computer work, or close hand work  No bending, lifting, or straining.  May watch TV  For 24 hours:  No driving, legal decisions, or alcoholic beverages  Safety precautions  Eat anything you prefer: It is better to start with liquids, then soup then solid foods.  _____ Eye patch should be worn until postoperative exam tomorrow.  ____ Solar shield eyeglasses should be worn for comfort in the sunlight/patch while sleeping  Resume all regular medications including aspirin or Coumadin if these were discontinued prior to surgery. You may shower, bathe, shave, or wash your hair. Tylenol may be taken for mild discomfort.  Call your doctor if you experience significant pain, nausea, or vomiting, fever > 101 or other signs of infection. [phone] or [phone] Specific instructions:  Follow-up Information    Follow up with Miraya Cudney, MD In 1 day.   Specialty:  Ophthalmology   Why:  february 28 at 9:45am   Contact information:   [address]   [place] [address] [phone]          Please contact your physician with any problems or Same Day Surgery at [phone], Monday through Friday 6 am to 4 pm, or [place] at [facility] number at [phone].

## 2015-10-27 NOTE — Transfer of Care (Signed)
Immediate Anesthesia Transfer of Care Note  Patient: Steve Gregory  Procedure(s) Performed: Procedure(s) with comments: CATARACT EXTRACTION PHACO AND INTRAOCULAR LENS PLACEMENT ([place]) suture placed in right eye at end of procedure (Right) - [place] 01:02 AP% 24.4 CDE 28.12 fluid pack lot # [id number]  Patient Location: PACU  Anesthesia Type:MAC  Level of Consciousness: awake, alert , oriented and patient cooperative  Airway & Oxygen Therapy: Patient Spontanous Breathing  Post-op Assessment: Report given to RN and Post -op Vital signs reviewed and stable  Post vital signs: Reviewed and stable  Last Vitals:  Filed Vitals:   10/27/15 0736 10/27/15 0951  BP: 142/73 121/61  Pulse: 83 84  Temp: 36.6 C 36.6 C  Resp: 16 20    Complications: No apparent anesthesia complications

## 2015-10-27 NOTE — Op Note (Signed)
Date of Surgery: 10/27/2015 Date of Dictation: 10/27/2015 9:48 AM Pre-operative Diagnosis:  Nuclear Sclerotic Cataract and Posterior Subcapsular Cataract right Eye Post-operative Diagnosis: same Procedure performed: Extra-capsular Cataract Extraction (ECCE) with placement of a posterior chamber intraocular lens (IOL) right Eye IOL:  Implant Name Type Inv. Item Serial No. Manufacturer Lot No. LRB No. Used  LENS IOL ACRYSOF IQ 20.5 - [id number] Intraocular Lens LENS IOL ACRYSOF IQ 20.5 [id number] ALCON   Right 1   Anesthesia: 2% Lidocaine and 4% Marcaine in a 50/50 mixture with 10 unites/ml of Hylenex given as a peribulbar Anesthesiologist: Anesthesiologist: Martha Clan, MD CRNA: Bernardo Heater, CRNA Complications: none Estimated Blood Loss: less than 1 ml  Description of procedure:  The patient was given anesthesia and sedation via intravenous access. The patient was then prepped and draped in the usual fashion. A 25-gauge needle was bent for initiating the capsulorhexis. A 5-0 silk suture was placed through the conjunctiva superior and inferiorly to serve as bridle sutures. Hemostasis was obtained at the superior limbus using an eraser cautery. A partial thickness groove was made at the anterior surgical limbus with a 64 Beaver blade and this was dissected anteriorly with an Avaya. The anterior chamber was entered at 10 o'clock with a 1.0 mm paracentesis knife and through the lamellar dissection with a 2.6 mm Alcon keratome. Epi-Shugarcaine 0.5 CC [9 cc BSS Plus (Alcon), 3 cc 4% preservative-free lidocaine (Hospira) and 4 cc 1:1000 preservative-free, bisulfite-free epinephrine] was injected into the anterior chamber via the paracentesis tract. Epi-Shugarcaine 0.5 CC [9 cc BSS Plus (Alcon), 3 cc 4% preservative-free lidocaine (Hospira) and 4 cc 1:1000 preservative-free, bisulfite-free epinephrine] was injected into the anterior chamber via the paracentesis tract. DiscoVisc was  injected to replace the aqueous and and break the inferior synechia. A continuous tear curvilinear capsulorhexis was performed using a bent 25-gauge needle.  Balance salt on a syringe was used to perform hydro-dissection and phacoemulsification was carried out using a divide and conquer technique. Procedure(s) with comments: CATARACT EXTRACTION PHACO AND INTRAOCULAR LENS PLACEMENT ([facility]) suture placed in right eye at end of procedure (Right) - [place] 01:02 AP% 24.4 CDE 28.12 fluid pack lot # [id number]. Irrigation/aspiration was used to remove the residual cortex and the capsular bag was inflated with DiscoVisc. The intraocular lens was inserted into the capsular bag using a pre-loaded UltraSert Delivery System. Irrigation/aspiration was used to remove the residual DiscoVisc. The wound was inflated with balanced salt and checked for leaks. None were found. Miostat was injected via the paracentesis track and 0.1 ml of cefuroxime containing 1 mg of drug  was injected via the paracentesis track. The wound was checked for leaks again and none were found.   The bridal sutures were removed and two drops of Vigamox were placed on the eye. An eye shield was placed to protect the eye and the patient was discharged to the recovery area in good condition.   Alexanderjames Berg MD

## 2015-10-27 NOTE — Anesthesia Preprocedure Evaluation (Signed)
Anesthesia Evaluation  Patient identified by MRN, date of birth, ID band Patient awake    Reviewed: Allergy & Precautions, H&P , NPO status , Patient's Chart, lab work & pertinent test results, reviewed documented beta blocker date and time   History of Anesthesia Complications Negative for: history of anesthetic complications  Airway Mallampati: II  TM Distance: >3 FB Neck ROM: full    Dental no notable dental hx. (+) Upper Dentures, Missing, Poor Dentition   Pulmonary shortness of breath, with exertion and at rest, neg sleep apnea, COPD,  COPD inhaler, neg recent URI, Current Smoker,    Pulmonary exam normal breath sounds clear to auscultation       Cardiovascular Exercise Tolerance: Good hypertension, On Medications (-) angina(-) CAD, (-) Past MI, (-) Cardiac Stents and (-) CABG Normal cardiovascular exam(-) dysrhythmias (-) Valvular Problems/Murmurs Rhythm:regular Rate:Normal     Neuro/Psych Seizures -,  PSYCHIATRIC DISORDERS (Depression)    GI/Hepatic Neg liver ROS, hiatal hernia, GERD  Medicated,  Endo/Other  negative endocrine ROS  Renal/GU negative Renal ROS  negative genitourinary   Musculoskeletal   Abdominal   Peds  Hematology negative hematology ROS (+)   Anesthesia Other Findings Past Medical History:   COPD (chronic obstructive pulmonary disease) (*              Oxygen decrease                                                Comment:H/O HOME USE, NOT NOW   Hypertension                                                 GERD (gastroesophageal reflux disease)                       History of hiatal hernia                                     Arthritis                                                    Depression                                                   Cough                                                          Comment:CHRONIC   Wheezing  Headache                                                     HOH (hard of hearing)                                        Dizziness                                                    Shortness of breath dyspnea                                    Comment:uses many different inhalers   Orthopnea                                                    Seizures (HCC)                                                 Comment:sounds like it could be vagal at times,               coughing can cause him to fade out and see               white lights. had last sz 3 months ago when not              taking meds   Reproductive/Obstetrics negative OB ROS                             Anesthesia Physical Anesthesia Plan  ASA: III  Anesthesia Plan: MAC   Post-op Pain Management:    Induction:   Airway Management Planned:   Additional Equipment:   Intra-op Plan:   Post-operative Plan:   Informed Consent: I have reviewed the patients History and Physical, chart, labs and discussed the procedure including the risks, benefits and alternatives for the proposed anesthesia with the patient or authorized representative who has indicated his/her understanding and acceptance.   Dental Advisory Given  Plan Discussed with: Anesthesiologist, CRNA and Surgeon  Anesthesia Plan Comments:         Anesthesia Quick Evaluation

## 2015-10-27 NOTE — Interval H&P Note (Signed)
History and Physical Interval Note:  10/27/2015 9:01 AM  Steve Gregory  has presented today for surgery, with the diagnosis of cataract  The various methods of treatment have been discussed with the patient and family. After consideration of risks, benefits and other options for treatment, the patient has consented to  Procedure(s): CATARACT EXTRACTION PHACO AND INTRAOCULAR LENS PLACEMENT ([place]) (Right) as a surgical intervention .  The patient's history has been reviewed, patient examined, no change in status, stable for surgery.  I have reviewed the patient's chart and labs.  Questions were answered to the patient's satisfaction.     Elliyah Liszewski

## 2015-10-28 NOTE — Anesthesia Postprocedure Evaluation (Signed)
Anesthesia Post Note  Patient: TYRICE HEWITT  Procedure(s) Performed: Procedure(s) (LRB): CATARACT EXTRACTION PHACO AND INTRAOCULAR LENS PLACEMENT ([facility]) suture placed in right eye at end of procedure (Right)  Patient location during evaluation: PACU Anesthesia Type: General Level of consciousness: awake and alert Pain management: pain level controlled Vital Signs Assessment: post-procedure vital signs reviewed and stable Respiratory status: spontaneous breathing, nonlabored ventilation, respiratory function stable and patient connected to nasal cannula oxygen Cardiovascular status: blood pressure returned to baseline and stable Postop Assessment: no signs of nausea or vomiting Anesthetic complications: no    Last Vitals:  Filed Vitals:   10/27/15 0951 10/27/15 0954  BP: 121/61 121/61  Pulse: 84 83  Temp: 36.6 C 36.6 C  Resp: 20 20    Last Pain: There were no vitals filed for this visit.               Martha Clan

## 2015-11-14 ENCOUNTER — Encounter: Payer: Self-pay | Admitting: *Deleted

## 2015-11-16 NOTE — H&P (Signed)
See scanned note.

## 2015-11-17 ENCOUNTER — Encounter: Payer: Self-pay | Admitting: *Deleted

## 2015-11-17 ENCOUNTER — Ambulatory Visit
Admission: RE | Admit: 2015-11-17 | Discharge: 2015-11-17 | Disposition: A | Discharge status: Home / Self Care | Payer: Medicare Other | Source: Ambulatory Visit | Attending: Ophthalmology | Admitting: Ophthalmology

## 2015-11-17 ENCOUNTER — Ambulatory Visit: Payer: Medicare Other | Admitting: Anesthesiology

## 2015-11-17 ENCOUNTER — Encounter
Admission: RE | Disposition: A | Discharge status: Home / Self Care | Payer: Self-pay | Source: Ambulatory Visit | Attending: Ophthalmology

## 2015-11-17 DIAGNOSIS — H2512 Age-related nuclear cataract, left eye: Secondary | ICD-10-CM | POA: Diagnosis not present

## 2015-11-17 DIAGNOSIS — H268 Other specified cataract: Secondary | ICD-10-CM | POA: Diagnosis not present

## 2015-11-17 DIAGNOSIS — M1612 Unilateral primary osteoarthritis, left hip: Secondary | ICD-10-CM | POA: Insufficient documentation

## 2015-11-17 DIAGNOSIS — F329 Major depressive disorder, single episode, unspecified: Secondary | ICD-10-CM | POA: Diagnosis not present

## 2015-11-17 DIAGNOSIS — Z888 Allergy status to other drugs, medicaments and biological substances status: Secondary | ICD-10-CM | POA: Diagnosis not present

## 2015-11-17 DIAGNOSIS — K219 Gastro-esophageal reflux disease without esophagitis: Secondary | ICD-10-CM | POA: Insufficient documentation

## 2015-11-17 DIAGNOSIS — J449 Chronic obstructive pulmonary disease, unspecified: Secondary | ICD-10-CM | POA: Diagnosis not present

## 2015-11-17 DIAGNOSIS — M1990 Unspecified osteoarthritis, unspecified site: Secondary | ICD-10-CM | POA: Insufficient documentation

## 2015-11-17 DIAGNOSIS — I10 Essential (primary) hypertension: Secondary | ICD-10-CM | POA: Insufficient documentation

## 2015-11-17 DIAGNOSIS — F17200 Nicotine dependence, unspecified, uncomplicated: Secondary | ICD-10-CM | POA: Insufficient documentation

## 2015-11-17 DIAGNOSIS — H9192 Unspecified hearing loss, left ear: Secondary | ICD-10-CM | POA: Diagnosis not present

## 2015-11-17 HISTORY — DX: Personal history of other diseases of the respiratory system: Z87.09

## 2015-11-17 HISTORY — DX: Pneumonia, unspecified organism: J18.9

## 2015-11-17 HISTORY — PX: CATARACT EXTRACTION W/PHACO: SHX586

## 2015-11-17 HISTORY — DX: Emphysema, unspecified: J43.9

## 2015-11-17 HISTORY — DX: Unspecified chronic bronchitis: J42

## 2015-11-17 SURGERY — PHACOEMULSIFICATION, CATARACT, WITH IOL INSERTION
Anesthesia: Monitor Anesthesia Care | Site: Eye | Laterality: Left | Wound class: Clean

## 2015-11-17 MED ORDER — PHENYLEPHRINE HCL 10 % OP SOLN
OPHTHALMIC | Status: AC
  Administered 2015-11-17: 1 [drp] via OPHTHALMIC
  Filled 2015-11-17: qty 5

## 2015-11-17 MED ORDER — PHENYLEPHRINE HCL 10 % OP SOLN
1.0000 [drp] | Freq: Once | OPHTHALMIC | Status: AC
  Administered 2015-11-17: 1 [drp] via OPHTHALMIC

## 2015-11-17 MED ORDER — LIDOCAINE HCL (PF) 4 % IJ SOLN
INTRAMUSCULAR | Status: DC | PRN
  Administered 2015-11-17: 4 mL via OPHTHALMIC

## 2015-11-17 MED ORDER — CEFUROXIME OPHTHALMIC INJECTION 1 MG/0.1 ML
INJECTION | OPHTHALMIC | Status: DC | PRN
  Administered 2015-11-17: .1 mL via INTRACAMERAL

## 2015-11-17 MED ORDER — ALFENTANIL 500 MCG/ML IJ INJ
INJECTION | INTRAMUSCULAR | Status: DC | PRN
  Administered 2015-11-17: 500 ug via INTRAVENOUS

## 2015-11-17 MED ORDER — BUPIVACAINE HCL (PF) 0.75 % IJ SOLN
INTRAMUSCULAR | Status: AC
  Filled 2015-11-17: qty 10

## 2015-11-17 MED ORDER — MOXIFLOXACIN HCL 0.5 % OP SOLN
OPHTHALMIC | Status: AC
  Administered 2015-11-17: 1 [drp] via OPHTHALMIC
  Filled 2015-11-17: qty 3

## 2015-11-17 MED ORDER — MIDAZOLAM HCL 2 MG/2ML IJ SOLN
INTRAMUSCULAR | Status: DC | PRN
  Administered 2015-11-17: 1 mg via INTRAVENOUS

## 2015-11-17 MED ORDER — NA CHONDROIT SULF-NA HYALURON 40-17 MG/ML IO SOLN
INTRAOCULAR | Status: DC | PRN
  Administered 2015-11-17: 1 mL via INTRAOCULAR

## 2015-11-17 MED ORDER — ONDANSETRON HCL 4 MG/2ML IJ SOLN
INTRAMUSCULAR | Status: DC | PRN
  Administered 2015-11-17: 4 mg via INTRAVENOUS

## 2015-11-17 MED ORDER — SODIUM CHLORIDE 0.9 % IV SOLN
INTRAVENOUS | Status: DC
  Administered 2015-11-17: 08:00:00 via INTRAVENOUS

## 2015-11-17 MED ORDER — LIDOCAINE HCL (PF) 4 % IJ SOLN
INTRAMUSCULAR | Status: AC
  Filled 2015-11-17: qty 5

## 2015-11-17 MED ORDER — POVIDONE-IODINE 5 % OP SOLN
OPHTHALMIC | Status: AC
  Filled 2015-11-17: qty 30

## 2015-11-17 MED ORDER — CARBACHOL 0.01 % IO SOLN
INTRAOCULAR | Status: DC | PRN
  Administered 2015-11-17: .5 mL via INTRAOCULAR

## 2015-11-17 MED ORDER — MOXIFLOXACIN HCL 0.5 % OP SOLN
OPHTHALMIC | Status: DC | PRN
  Administered 2015-11-17: 1 [drp] via OPHTHALMIC

## 2015-11-17 MED ORDER — HYALURONIDASE HUMAN 150 UNIT/ML IJ SOLN
INTRAMUSCULAR | Status: AC
Start: 2015-11-17 — End: 2015-11-17
  Filled 2015-11-17: qty 1

## 2015-11-17 MED ORDER — BSS IO SOLN
INTRAOCULAR | Status: DC | PRN
  Administered 2015-11-17: .5 mL via OPHTHALMIC

## 2015-11-17 MED ORDER — EPINEPHRINE HCL 1 MG/ML IJ SOLN
INTRAMUSCULAR | Status: AC
  Filled 2015-11-17: qty 2

## 2015-11-17 MED ORDER — EPINEPHRINE HCL 1 MG/ML IJ SOLN
INTRAOCULAR | Status: DC | PRN
  Administered 2015-11-17: 1 mL via OPHTHALMIC

## 2015-11-17 MED ORDER — NA CHONDROIT SULF-NA HYALURON 40-17 MG/ML IO SOLN
INTRAOCULAR | Status: AC
  Filled 2015-11-17: qty 1

## 2015-11-17 MED ORDER — CYCLOPENTOLATE HCL 2 % OP SOLN
OPHTHALMIC | Status: AC
  Administered 2015-11-17: 1 [drp] via OPHTHALMIC
  Filled 2015-11-17: qty 2

## 2015-11-17 MED ORDER — CYCLOPENTOLATE HCL 2 % OP SOLN
1.0000 [drp] | Freq: Once | OPHTHALMIC | Status: AC
  Administered 2015-11-17: 1 [drp] via OPHTHALMIC

## 2015-11-17 MED ORDER — CEFUROXIME OPHTHALMIC INJECTION 1 MG/0.1 ML
INJECTION | OPHTHALMIC | Status: AC
  Filled 2015-11-17: qty 0.1

## 2015-11-17 MED ORDER — TETRACAINE HCL 0.5 % OP SOLN
OPHTHALMIC | Status: DC | PRN
  Administered 2015-11-17: 1 [drp] via OPHTHALMIC

## 2015-11-17 MED ORDER — MOXIFLOXACIN HCL 0.5 % OP SOLN
1.0000 [drp] | Freq: Once | OPHTHALMIC | Status: AC
  Administered 2015-11-17: 1 [drp] via OPHTHALMIC

## 2015-11-17 MED ORDER — POVIDONE-IODINE 5 % OP SOLN
OPHTHALMIC | Status: DC | PRN
  Administered 2015-11-17: 1 via OPHTHALMIC

## 2015-11-17 MED ORDER — TETRACAINE HCL 0.5 % OP SOLN
OPHTHALMIC | Status: AC
Start: 2015-11-17 — End: 2015-11-17
  Filled 2015-11-17: qty 2

## 2015-11-17 SURGICAL SUPPLY — 30 items
CANNULA ANT/CHMB 27G (MISCELLANEOUS) ×1 IMPLANT
CANNULA ANT/CHMB 27GA (MISCELLANEOUS) ×3 IMPLANT
CORD BIP STRL DISP 12FT (MISCELLANEOUS) ×3 IMPLANT
CUP MEDICINE 2OZ PLAST GRAD ST (MISCELLANEOUS) ×3 IMPLANT
DRAPE XRAY CASSETTE 23X24 (DRAPES) ×3 IMPLANT
ERASER HMR WETFIELD 18G (MISCELLANEOUS) ×3 IMPLANT
GLOVE BIO SURGEON STRL SZ8 (GLOVE) ×3 IMPLANT
GLOVE SURG LX 6.5 MICRO (GLOVE) ×2
GLOVE SURG LX 8.0 MICRO (GLOVE) ×2
GLOVE SURG LX STRL 6.5 MICRO (GLOVE) ×1 IMPLANT
GLOVE SURG LX STRL 8.0 MICRO (GLOVE) ×1 IMPLANT
GOWN STRL REUS W/ TWL LRG LVL3 (GOWN DISPOSABLE) ×1 IMPLANT
GOWN STRL REUS W/ TWL XL LVL3 (GOWN DISPOSABLE) ×1 IMPLANT
GOWN STRL REUS W/TWL LRG LVL3 (GOWN DISPOSABLE) ×3
GOWN STRL REUS W/TWL XL LVL3 (GOWN DISPOSABLE) ×3
LENS IOL ACRYSOF IQ 21.0 (Intraocular Lens) ×2 IMPLANT
PACK CATARACT (MISCELLANEOUS) ×3 IMPLANT
PACK CATARACT DINGLEDEIN LX (MISCELLANEOUS) ×3 IMPLANT
PACK EYE AFTER SURG (MISCELLANEOUS) ×3 IMPLANT
SHLD EYE VISITEC  UNIV (MISCELLANEOUS) ×3 IMPLANT
SOL BSS BAG (MISCELLANEOUS) ×3
SOL PREP PVP 2OZ (MISCELLANEOUS) ×3
SOLUTION BSS BAG (MISCELLANEOUS) ×1 IMPLANT
SOLUTION PREP PVP 2OZ (MISCELLANEOUS) ×1 IMPLANT
SUT SILK 5-0 (SUTURE) ×3 IMPLANT
SYR 3ML LL SCALE MARK (SYRINGE) ×3 IMPLANT
SYR 5ML LL (SYRINGE) ×3 IMPLANT
SYR TB 1ML 27GX1/2 LL (SYRINGE) ×3 IMPLANT
WATER STERILE IRR 1000ML POUR (IV SOLUTION) ×3 IMPLANT
WIPE NON LINTING 3.25X3.25 (MISCELLANEOUS) ×3 IMPLANT

## 2015-11-17 NOTE — Op Note (Signed)
Date of Surgery: 11/17/2015 Date of Dictation: 11/17/2015 10:33 AM Pre-operative Diagnosis:  Nuclear Sclerotic Cataract and Posterior Subcapsular Cataract left Eye Post-operative Diagnosis: same Procedure performed: Extra-capsular Cataract Extraction (ECCE) with placement of a posterior chamber intraocular lens (IOL) left Eye IOL:  Implant Name Type Inv. Item Serial No. Manufacturer Lot No. LRB No. Used  LENS IOL ACRYSOF IQ 21.0 - [id number] Intraocular Lens LENS IOL ACRYSOF IQ 21.0 [id number] ALCON   Left 1   Anesthesia: 2% Lidocaine and 4% Marcaine in a 50/50 mixture with 10 unites/ml of Hylenex given as a peribulbar Anesthesiologist: Anesthesiologist: Lyn Hollingshead, MD CRNA: Courtney Paris, CRNA Complications: none Estimated Blood Loss: less than 1 ml  Description of procedure:  The patient was given anesthesia and sedation via intravenous access. The patient was then prepped and draped in the usual fashion. A 25-gauge needle was bent for initiating the capsulorhexis. A 5-0 silk suture was placed through the conjunctiva superior and inferiorly to serve as bridle sutures. Hemostasis was obtained at the superior limbus using an eraser cautery. A partial thickness groove was made at the anterior surgical limbus with a 64 Beaver blade and this was dissected anteriorly with an Avaya. The anterior chamber was entered at 10 o'clock with a 1.0 mm paracentesis knife and through the lamellar dissection with a 2.6 mm Alcon keratome. Epi-Shugarcaine 0.5 CC [9 cc BSS Plus (Alcon), 3 cc 4% preservative-free lidocaine (Hospira) and 4 cc 1:1000 preservative-free, bisulfite-free epinephrine] was injected into the anterior chamber via the paracentesis tract. Epi-Shugarcaine 0.5 CC [9 cc BSS Plus (Alcon), 3 cc 4% preservative-free lidocaine (Hospira) and 4 cc 1:1000 preservative-free, bisulfite-free epinephrine] was injected into the anterior chamber via the paracentesis tract. DiscoVisc was  injected to replace the aqueous. DiscoVisc and a Kugland hook was used to break iris attachments to the lens. The pupil enlarged sufficiently, A continuous tear curvilinear capsulorhexis was performed using a bent 25-gauge needle.  Balance salt on a syringe was used to perform hydro-dissection and phacoemulsification was carried out using a divide and conquer technique. Procedure(s) with comments: CATARACT EXTRACTION PHACO AND INTRAOCULAR LENS PLACEMENT ([facility]) (Left) - [place] 01:29 AP% 24.3 CDE 40.05 fluid pack lot # [id number]. Irrigation/aspiration was used to remove the residual cortex and the capsular bag was inflated with DiscoVisc. The intraocular lens was inserted into the capsular bag using a pre-loaded UltraSert Delivery System. Irrigation/aspiration was used to remove the residual DiscoVisc. The wound was inflated with balanced salt and checked for leaks. None were found. Miostat was injected via the paracentesis track and 0.1 ml of cefuroxime containing 1 mg of drug  was injected via the paracentesis track. The wound was checked for leaks again and none were found.   The bridal sutures were removed and two drops of Vigamox were placed on the eye. An eye shield was placed to protect the eye and the patient was discharged to the recovery area in good condition.   Stefanie Hodgens MD

## 2015-11-17 NOTE — Anesthesia Postprocedure Evaluation (Signed)
Anesthesia Post Note  Patient: Steve Gregory  Procedure(s) Performed: Procedure(s) (LRB): CATARACT EXTRACTION PHACO AND INTRAOCULAR LENS PLACEMENT (IOC) (Left)  Patient location during evaluation: PACU Anesthesia Type: MAC Level of consciousness: awake Pain management: pain level controlled Vital Signs Assessment: post-procedure vital signs reviewed and stable Respiratory status: spontaneous breathing Cardiovascular status: stable Postop Assessment: no signs of nausea or vomiting and adequate PO intake Anesthetic complications: no    Last Vitals:  Filed Vitals:   11/17/15 0812 11/17/15 1035  BP: 143/75 115/59  Pulse: 105 85  Temp: 36.7 C 36.3 C  Resp: 18 20    Last Pain: There were no vitals filed for this visit.               [place]

## 2015-11-17 NOTE — Transfer of Care (Signed)
Immediate Anesthesia Transfer of Care Note  Patient: Steve Gregory  Procedure(s) Performed: Procedure(s) with comments: CATARACT EXTRACTION PHACO AND INTRAOCULAR LENS PLACEMENT (IOC) (Left) - [place] 01:29 AP% 24.3 CDE 40.05 fluid pack lot # [id number]  Patient Location: PACU and Short Stay  Anesthesia Type:MAC  Level of Consciousness: awake, oriented and patient cooperative  Airway & Oxygen Therapy: Patient Spontanous Breathing  Post-op Assessment: Report given to RN and Post -op Vital signs reviewed and stable  Post vital signs: Reviewed and stable  Last Vitals:  Filed Vitals:   11/17/15 0812  BP: 143/75  Pulse: 105  Temp: 36.7 C  Resp: 18    Complications: No apparent anesthesia complications

## 2015-11-17 NOTE — Anesthesia Preprocedure Evaluation (Signed)
Anesthesia Evaluation  Patient identified by MRN, date of birth, ID band Patient awake    Reviewed: Allergy & Precautions, H&P , NPO status , Patient's Chart, lab work & pertinent test results  History of Anesthesia Complications Negative for: history of anesthetic complications  Airway Mallampati: II  TM Distance: >3 FB Neck ROM: full    Dental no notable dental hx. (+) Upper Dentures, Missing, Poor Dentition   Pulmonary shortness of breath, with exertion and at rest, neg sleep apnea, COPD,  COPD inhaler, neg recent URI, Current Smoker,    Pulmonary exam normal breath sounds clear to auscultation       Cardiovascular Exercise Tolerance: Good hypertension, On Medications (-) angina(-) CAD, (-) Past MI, (-) Cardiac Stents and (-) CABG Normal cardiovascular exam(-) dysrhythmias (-) Valvular Problems/Murmurs Rhythm:regular Rate:Normal     Neuro/Psych Seizures -,  PSYCHIATRIC DISORDERS (Depression)    GI/Hepatic Neg liver ROS, hiatal hernia, GERD  Medicated,  Endo/Other  negative endocrine ROS  Renal/GU negative Renal ROS  negative genitourinary   Musculoskeletal   Abdominal   Peds  Hematology negative hematology ROS (+)   Anesthesia Other Findings Past Medical History:   COPD (chronic obstructive pulmonary disease) (*              Oxygen decrease                                                Comment:H/O HOME USE, NOT NOW   Hypertension                                                 GERD (gastroesophageal reflux disease)                       History of hiatal hernia                                     Arthritis                                                    Depression                                                   Cough                                                          Comment:CHRONIC   Wheezing  Headache                                                      HOH (hard of hearing)                                        Dizziness                                                    Shortness of breath dyspnea                                    Comment:uses many different inhalers   Orthopnea                                                    Seizures (HCC)                                                 Comment:sounds like it could be vagal at times,               coughing can cause him to fade out and see               white lights. had last sz 3 months ago when not              taking meds   Reproductive/Obstetrics negative OB ROS                             Anesthesia Physical  Anesthesia Plan  ASA: III  Anesthesia Plan: MAC   Post-op Pain Management:    Induction:   Airway Management Planned:   Additional Equipment:   Intra-op Plan:   Post-operative Plan:   Informed Consent: I have reviewed the patients History and Physical, chart, labs and discussed the procedure including the risks, benefits and alternatives for the proposed anesthesia with the patient or authorized representative who has indicated his/her understanding and acceptance.     Plan Discussed with: CRNA and Surgeon  Anesthesia Plan Comments:         Anesthesia Quick Evaluation

## 2015-11-17 NOTE — Interval H&P Note (Signed)
History and Physical Interval Note:  11/17/2015 9:52 AM  Steve Gregory  has presented today for surgery, with the diagnosis of CATARACT  The various methods of treatment have been discussed with the patient and family. After consideration of risks, benefits and other options for treatment, the patient has consented to  Procedure(s): CATARACT EXTRACTION PHACO AND INTRAOCULAR LENS PLACEMENT ([place]) (Left) as a surgical intervention .  The patient's history has been reviewed, patient examined, no change in status, stable for surgery.  I have reviewed the patient's chart and labs.  Questions were answered to the patient's satisfaction.     [name]

## 2015-11-17 NOTE — Discharge Instructions (Signed)
Eye Surgery Discharge Instructions  Expect mild scratchy sensation or mild soreness. DO NOT RUB YOUR EYE!  The day of surgery:  Minimal physical activity, but bed rest is not required  No reading, computer work, or close hand work  No bending, lifting, or straining.  May watch TV  For 24 hours:  No driving, legal decisions, or alcoholic beverages  Safety precautions  Eat anything you prefer: It is better to start with liquids, then soup then solid foods.  _____ Eye patch should be worn until postoperative exam tomorrow.  ____ Solar shield eyeglasses should be worn for comfort in the sunlight/patch while sleeping  Resume all regular medications including aspirin or Coumadin if these were discontinued prior to surgery. You may shower, bathe, shave, or wash your hair. Tylenol may be taken for mild discomfort.  Call your doctor if you experience significant pain, nausea, or vomiting, fever > 101 or other signs of infection. [phone] or [phone] Specific instructions:  Follow-up Information    Follow up with Estill Cotta, MD.   Specialty:  Ophthalmology   Why:  follow up 3/21 at [place] information:   [place] [address] [phone]     AMBULATORY SURGERY  DISCHARGE INSTRUCTIONS   1) The drugs that you were given will stay in your system until tomorrow so for the next 24 hours you should not:  A) Drive an automobile B) Make any legal decisions C) Drink any alcoholic beverage   2) You may resume regular meals tomorrow.  Today it is better to start with liquids and gradually work up to solid foods.  You may eat anything you prefer, but it is better to start with liquids, then soup and crackers, and gradually work up to solid foods.   3) Please notify your doctor immediately if you have any unusual bleeding, trouble breathing, redness and pain at the surgery site, drainage, fever, or pain not relieved by  medication.    4) Additional Instructions:        Please contact your physician with any problems or Same Day Surgery at [phone], Monday through Friday 6 am to 4 pm, or [place] at [facility] number at [phone].

## 2016-09-17 ENCOUNTER — Other Ambulatory Visit: Payer: Self-pay | Admitting: Specialist

## 2016-09-17 DIAGNOSIS — R059 Cough, unspecified: Secondary | ICD-10-CM

## 2016-09-17 DIAGNOSIS — J42 Unspecified chronic bronchitis: Secondary | ICD-10-CM

## 2016-09-17 DIAGNOSIS — R05 Cough: Secondary | ICD-10-CM

## 2016-09-28 ENCOUNTER — Ambulatory Visit
Admission: RE | Admit: 2016-09-28 | Discharge: 2016-09-28 | Disposition: A | Discharge status: Home / Self Care | Payer: Medicare Other | Source: Ambulatory Visit | Attending: Specialist | Admitting: Specialist

## 2016-09-28 DIAGNOSIS — J42 Unspecified chronic bronchitis: Secondary | ICD-10-CM | POA: Diagnosis not present

## 2016-09-28 DIAGNOSIS — K7689 Other specified diseases of liver: Secondary | ICD-10-CM | POA: Insufficient documentation

## 2016-09-28 DIAGNOSIS — I2510 Atherosclerotic heart disease of native coronary artery without angina pectoris: Secondary | ICD-10-CM | POA: Diagnosis not present

## 2016-09-28 DIAGNOSIS — R918 Other nonspecific abnormal finding of lung field: Secondary | ICD-10-CM | POA: Diagnosis not present

## 2016-09-28 DIAGNOSIS — R059 Cough, unspecified: Secondary | ICD-10-CM

## 2016-09-28 DIAGNOSIS — J439 Emphysema, unspecified: Secondary | ICD-10-CM | POA: Insufficient documentation

## 2016-09-28 DIAGNOSIS — R05 Cough: Secondary | ICD-10-CM | POA: Diagnosis present

## 2016-10-11 ENCOUNTER — Encounter (INDEPENDENT_AMBULATORY_CARE_PROVIDER_SITE_OTHER): Payer: Medicare Other | Admitting: Vascular Surgery

## 2016-11-04 DIAGNOSIS — Z0000 Encounter for general adult medical examination without abnormal findings: Secondary | ICD-10-CM | POA: Insufficient documentation

## 2016-12-01 ENCOUNTER — Other Ambulatory Visit: Payer: Self-pay | Admitting: Specialist

## 2016-12-01 DIAGNOSIS — R911 Solitary pulmonary nodule: Secondary | ICD-10-CM

## 2017-03-07 DIAGNOSIS — R739 Hyperglycemia, unspecified: Secondary | ICD-10-CM | POA: Insufficient documentation

## 2017-03-07 DIAGNOSIS — R7303 Prediabetes: Secondary | ICD-10-CM | POA: Insufficient documentation

## 2017-03-29 ENCOUNTER — Ambulatory Visit: Payer: Medicare Other | Attending: Specialist

## 2017-04-03 ENCOUNTER — Emergency Department
Admission: EM | Admit: 2017-04-03 | Discharge: 2017-04-03 | Disposition: A | Discharge status: Home / Self Care | Payer: Medicare Other | Attending: Emergency Medicine | Admitting: Emergency Medicine

## 2017-04-03 ENCOUNTER — Emergency Department: Payer: Medicare Other

## 2017-04-03 ENCOUNTER — Encounter: Payer: Self-pay | Admitting: Emergency Medicine

## 2017-04-03 DIAGNOSIS — F17210 Nicotine dependence, cigarettes, uncomplicated: Secondary | ICD-10-CM | POA: Insufficient documentation

## 2017-04-03 DIAGNOSIS — M5442 Lumbago with sciatica, left side: Secondary | ICD-10-CM | POA: Diagnosis not present

## 2017-04-03 DIAGNOSIS — J449 Chronic obstructive pulmonary disease, unspecified: Secondary | ICD-10-CM | POA: Diagnosis not present

## 2017-04-03 DIAGNOSIS — G8929 Other chronic pain: Secondary | ICD-10-CM

## 2017-04-03 DIAGNOSIS — M545 Low back pain: Secondary | ICD-10-CM

## 2017-04-03 DIAGNOSIS — M479 Spondylosis, unspecified: Secondary | ICD-10-CM | POA: Insufficient documentation

## 2017-04-03 DIAGNOSIS — Z79899 Other long term (current) drug therapy: Secondary | ICD-10-CM | POA: Insufficient documentation

## 2017-04-03 DIAGNOSIS — I10 Essential (primary) hypertension: Secondary | ICD-10-CM | POA: Insufficient documentation

## 2017-04-03 DIAGNOSIS — M8580 Other specified disorders of bone density and structure, unspecified site: Secondary | ICD-10-CM | POA: Insufficient documentation

## 2017-04-03 DIAGNOSIS — M8588 Other specified disorders of bone density and structure, other site: Secondary | ICD-10-CM

## 2017-04-03 DIAGNOSIS — M47816 Spondylosis without myelopathy or radiculopathy, lumbar region: Secondary | ICD-10-CM

## 2017-04-03 LAB — CBC
HCT: 44.1 % (ref 40.0–52.0)
HEMOGLOBIN: 14.7 g/dL (ref 13.0–18.0)
MCH: 29.8 pg (ref 26.0–34.0)
MCHC: 33.4 g/dL (ref 32.0–36.0)
MCV: 89.4 fL (ref 80.0–100.0)
PLATELETS: 261 10*3/uL (ref 150–440)
RBC: 4.93 MIL/uL (ref 4.40–5.90)
RDW: 14.3 % (ref 11.5–14.5)
WBC: 12 10*3/uL — ABNORMAL HIGH (ref 3.8–10.6)

## 2017-04-03 LAB — COMPREHENSIVE METABOLIC PANEL
ALK PHOS: 77 U/L (ref 38–126)
ALT: 8 U/L — AB (ref 17–63)
ANION GAP: 8 (ref 5–15)
AST: 15 U/L (ref 15–41)
Albumin: 3.8 g/dL (ref 3.5–5.0)
BUN: 12 mg/dL (ref 6–20)
CALCIUM: 9.3 mg/dL (ref 8.9–10.3)
CO2: 26 mmol/L (ref 22–32)
CREATININE: 0.89 mg/dL (ref 0.61–1.24)
Chloride: 106 mmol/L (ref 101–111)
Glucose, Bld: 109 mg/dL — ABNORMAL HIGH (ref 65–99)
Potassium: 4 mmol/L (ref 3.5–5.1)
SODIUM: 140 mmol/L (ref 135–145)
TOTAL PROTEIN: 7 g/dL (ref 6.5–8.1)
Total Bilirubin: 1 mg/dL (ref 0.3–1.2)

## 2017-04-03 LAB — URINALYSIS, COMPLETE (UACMP) WITH MICROSCOPIC
Bacteria, UA: NONE SEEN
Bilirubin Urine: NEGATIVE
GLUCOSE, UA: NEGATIVE mg/dL
Hgb urine dipstick: NEGATIVE
KETONES UR: NEGATIVE mg/dL
Leukocytes, UA: NEGATIVE
NITRITE: NEGATIVE
PH: 5 (ref 5.0–8.0)
Protein, ur: NEGATIVE mg/dL
Specific Gravity, Urine: 1.025 (ref 1.005–1.030)
Squamous Epithelial / LPF: NONE SEEN

## 2017-04-03 MED ORDER — LIDOCAINE 5 % EX PTCH
1.0000 | MEDICATED_PATCH | CUTANEOUS | Status: DC
  Administered 2017-04-03: 1 via TRANSDERMAL
  Filled 2017-04-03: qty 1

## 2017-04-03 MED ORDER — LIDOCAINE 5 % EX PTCH: 1.0000 | MEDICATED_PATCH | CUTANEOUS | 0 refills | Status: DC

## 2017-04-03 MED ORDER — CYCLOBENZAPRINE HCL 5 MG PO TABS: 5.0000 mg | ORAL_TABLET | Freq: Every day | ORAL | 0 refills | Status: AC

## 2017-04-03 NOTE — ED Provider Notes (Signed)
[facility] Emergency Department Provider Note  ____________________________________________  Time seen: Approximately 4:29 PM  I have reviewed the triage vital signs and the nursing notes.   HISTORY  Chief Complaint Back Pain    HPI Steve Gregory is a 61 y.o. male that presents to the emergency department with low left back pain for one month. Pain is throbbing in character and does not radiate. When patient is sitting his back starts to hurt. He gets up and move around which helps, but if he moves around too much it gets worse. He denies any trauma or lifting of heavy items but members in the room states that he lifts heavy itemsfrequently. They would like an x-ray completed. No bowel or bladder dysfunction or saddle paresthesias. He was given meloxicam but his doctor but does not like taking it. He denies shortness breath, chest pain, nausea, vomiting, abdominal pain, numbness, tingling.   Past Medical History:  Diagnosis Date  . Arthritis   . Bronchitis, chronic ([place])   . COPD (chronic obstructive pulmonary disease) ([place])   . Cough    CHRONIC  . Depression   . Dizziness   . GERD (gastroesophageal reflux disease)   . H/O emphysema   . Headache   . History of hiatal hernia   . HOH (hard of hearing)   . Hypertension   . Orthopnea   . Oxygen decrease    H/O HOME USE, NOT NOW  . Pneumonia    in the past  . Seizures ([place])    sounds like it could be vagal at times, coughing can cause him to fade out and see white lights. had last sz 3 months ago when not taking meds  . Shortness of breath dyspnea    uses many different inhalers  . Wheezing     There are no active problems to display for this patient.   Past Surgical History:  Procedure Laterality Date  . CATARACT EXTRACTION W/PHACO Right 10/27/2015   Procedure: CATARACT EXTRACTION PHACO AND INTRAOCULAR LENS PLACEMENT ([place]) suture placed in right eye at end of procedure;  Surgeon: Estill Cotta, MD;  Location: [facility] ORS;  Service: Ophthalmology;  Laterality: Right;  [place] 01:02 AP% 24.4 CDE 28.12 fluid pack lot # [id number]  . CATARACT EXTRACTION W/PHACO Left 11/17/2015   Procedure: CATARACT EXTRACTION PHACO AND INTRAOCULAR LENS PLACEMENT ([place]);  Surgeon: Estill Cotta, MD;  Location: [facility] ORS;  Service: Ophthalmology;  Laterality: Left;  [place] 01:29 AP% 24.3 CDE 40.05 fluid pack lot # [id number]  . EYE SURGERY    . [place]   LEFT CHEEK BONE INJURY 1980    Prior to Admission medications   Medication Sig Start Date End Date Taking? Authorizing Provider  albuterol (PROVENTIL HFA;VENTOLIN HFA) 108 (90 Base) MCG/ACT inhaler Inhale 2 puffs into the lungs every 6 (six) hours as needed for wheezing or shortness of breath.    [provider]  albuterol (PROVENTIL) (2.5 MG/3ML) 0.083% nebulizer solution Take 2.5 mg by nebulization every 6 (six) hours as needed for wheezing or shortness of breath.    [provider]  cyclobenzaprine (FLEXERIL) 5 MG tablet Take 1 tablet (5 mg total) by mouth at bedtime. 04/03/17 04/10/17  Laban Emperor, PA-C  fluticasone (FLONASE) 50 MCG/ACT nasal spray Place 2 sprays into both nostrils daily.    [provider]  Fluticasone-Salmeterol (ADVAIR) 250-50 MCG/DOSE AEPB Inhale 1 puff into the lungs 2 (two) times daily.    [provider]  ipratropium (ATROVENT HFA) 17 MCG/ACT inhaler Inhale into the lungs 3 (three) times daily.    [provider]  lidocaine (LIDODERM) 5 % Place 1 patch onto the skin daily. Remove & Discard patch within 12 hours or as directed by MD 04/03/17   Laban Emperor, PA-C  losartan (COZAAR) 25 MG tablet Take 25 mg by mouth daily.    [provider]  montelukast (SINGULAIR) 10 MG tablet Take 10 mg by mouth every morning.    [provider]  pantoprazole (PROTONIX) 40 MG tablet Take 40 mg by mouth daily.    [provider]  potassium chloride  (K-DUR) 10 MEQ tablet Take 10 mEq by mouth daily.    [provider]  predniSONE (DELTASONE) 10 MG tablet Take 15 mg by mouth daily with breakfast.    [provider]  theophylline (UNIPHYL) 400 MG 24 hr tablet Take 400 mg by mouth daily.    [provider]    Allergies Chantix [varenicline] and Lipitor [atorvastatin]  No family history on file.  Social History Social History  Substance Use Topics  . Smoking status: Current Every Day Smoker    Packs/day: 0.50    Years: 43.00  . Smokeless tobacco: Not on file  . Alcohol use No     Review of Systems  Constitutional: No fever/chills Cardiovascular: No chest pain. Respiratory:  No SOB. Gastrointestinal: No abdominal pain.  No nausea, no vomiting.  Musculoskeletal: Positive for back pain. Skin: Negative for rash, abrasions, lacerations, ecchymosis. Neurological: Negative for headaches, numbness or tingling   ____________________________________________   PHYSICAL EXAM:  VITAL SIGNS: ED Triage Vitals  Enc Vitals Group     BP 04/03/17 1302 136/74     Pulse Rate 04/03/17 1302 88     Resp 04/03/17 1302 (!) 26     Temp 04/03/17 1302 98.1 F (36.7 C)     Temp Source 04/03/17 1302 Oral     SpO2 04/03/17 1302 94 %     Weight 04/03/17 1303 170 lb (77.1 kg)     Height 04/03/17 1303 5\' 9"  (1.753 m)     Head Circumference --      Peak Flow --      Pain Score 04/03/17 1306 10     Pain Loc --      Pain Edu? --      Excl. in [place]? --      Constitutional: Alert and oriented. Well appearing and in no acute distress. Eyes: Conjunctivae are normal. PERRL. EOMI. Head: Atraumatic. ENT:      Ears:      Nose: No congestion/rhinnorhea.      Mouth/Throat: Mucous membranes are moist.  Neck: No stridor. Cardiovascular: Normal rate, regular rhythm.  Good peripheral circulation. Respiratory: Normal respiratory effort without tachypnea or retractions. Lungs CTAB. Good air entry to the bases with no decreased or  absent breath sounds. Gastrointestinal: Bowel sounds 4 quadrants. Soft and nontender to palpation. No guarding or rigidity. No palpable masses. No distention. No CVA tenderness. Musculoskeletal: Full range of motion to all extremities. No gross deformities appreciated. Pain in lower left back with all movements. Tenderness to palpation over left SI joint. Strength 5/5 in lower extremities. Neurologic:  Normal speech and language. No gross focal neurologic deficits are appreciated.  Skin:  Skin is warm, dry and intact. No rash noted.   ____________________________________________   LABS (all labs ordered are listed, but only abnormal results are displayed)  Labs Reviewed  URINALYSIS, COMPLETE (UACMP) WITH  MICROSCOPIC - Abnormal; Notable for the following:       Result Value   Color, Urine YELLOW (*)    APPearance TURBID (*)    All other components within normal limits  CBC - Abnormal; Notable for the following:    WBC 12.0 (*)    All other components within normal limits  COMPREHENSIVE METABOLIC PANEL - Abnormal; Notable for the following:    Glucose, Bld 109 (*)    ALT 8 (*)    All other components within normal limits   ____________________________________________  EKG   ____________________________________________  RADIOLOGY Robinette Haines, personally viewed and evaluated these images (plain radiographs) as part of my medical decision making, as well as reviewing the written report by the radiologist  Dg Lumbar Spine Complete  Result Date: 04/03/2017 CLINICAL DATA:  Low back pain for several weeks. EXAM: LUMBAR SPINE - COMPLETE 4+ VIEW COMPARISON:  None. FINDINGS: There is no evidence of lumbar spine fracture. Alignment is normal. Mild degenerative disc disease at L1-2. Generalized osteopenia. No other bone lesions identified. Aortic atherosclerosis. IMPRESSION: No acute findings. Electronically Signed   By: Earle Gell M.D.   On: 04/03/2017 15:34     ____________________________________________    PROCEDURES  Procedure(s) performed:    Procedures    Medications - No data to display   ____________________________________________   INITIAL IMPRESSION / ASSESSMENT AND PLAN / ED COURSE  Pertinent labs & imaging results that were available during my care of the patient were reviewed by me and considered in my medical decision making (see chart for details).  Review of the [place] CSRS was performed in accordance of the [place] prior to dispensing any controlled drugs.     Patient presented to emergency for low back pain for one month. Vital signs, exam, and labwork are reassuring. Lumbar xray indicates osteoarthritis and osteopenia. He recalls being told that he has had osteoarthritis in the past. No infection on urinalysis. Patient will be discharged home with prescriptions for Lidoderm and Flexeril. Patient is to follow up with PCP as directed. Patient is given ED precautions to return to the ED for any worsening or new symptoms.     ____________________________________________  FINAL CLINICAL IMPRESSION(S) / ED DIAGNOSES  Final diagnoses:  Chronic left-sided low back pain, with sciatica presence unspecified  Osteopenia of lumbar spine  Osteoarthritis of lumbar spine, unspecified spinal osteoarthritis complication status      NEW MEDICATIONS STARTED DURING THIS VISIT:  Discharge Medication List as of 04/03/2017  4:07 PM    START taking these medications   Details  cyclobenzaprine (FLEXERIL) 5 MG tablet Take 1 tablet (5 mg total) by mouth at bedtime., Starting Sun 04/03/2017, Until Sun 04/10/2017, Print    lidocaine (LIDODERM) 5 % Place 1 patch onto the skin daily. Remove & Discard patch within 12 hours or as directed by MD, Starting Sun 04/03/2017, Print            This chart was dictated using voice recognition software/Dragon. Despite best efforts to proofread, errors can occur which can change the meaning. Any  change was purely unintentional.    Laban Emperor, PA-C 04/04/17 1520    Schuyler Amor, MD 04/04/17 [phone]

## 2017-04-03 NOTE — ED Triage Notes (Signed)
Pt states that he has been having pain in the lower left back for several weeks. Pt denies radiation of pain.

## 2017-04-07 ENCOUNTER — Other Ambulatory Visit: Payer: Self-pay | Admitting: Specialist

## 2017-04-07 DIAGNOSIS — R911 Solitary pulmonary nodule: Secondary | ICD-10-CM

## 2017-04-21 ENCOUNTER — Ambulatory Visit
Admission: RE | Admit: 2017-04-21 | Discharge: 2017-04-21 | Disposition: A | Discharge status: Home / Self Care | Payer: Medicare Other | Source: Ambulatory Visit | Attending: Specialist | Admitting: Specialist

## 2017-04-21 DIAGNOSIS — J439 Emphysema, unspecified: Secondary | ICD-10-CM | POA: Diagnosis not present

## 2017-04-21 DIAGNOSIS — R911 Solitary pulmonary nodule: Secondary | ICD-10-CM | POA: Diagnosis not present

## 2017-04-21 DIAGNOSIS — R918 Other nonspecific abnormal finding of lung field: Secondary | ICD-10-CM | POA: Diagnosis present

## 2017-04-21 DIAGNOSIS — I700 Atherosclerosis of aorta: Secondary | ICD-10-CM | POA: Insufficient documentation

## 2017-04-25 ENCOUNTER — Other Ambulatory Visit: Payer: Self-pay | Admitting: Specialist

## 2017-04-25 DIAGNOSIS — R911 Solitary pulmonary nodule: Secondary | ICD-10-CM

## 2017-05-03 ENCOUNTER — Ambulatory Visit
Admission: RE | Admit: 2017-05-03 | Discharge: 2017-05-03 | Disposition: A | Discharge status: Home / Self Care | Payer: Medicare Other | Source: Ambulatory Visit | Attending: Specialist | Admitting: Specialist

## 2017-05-03 DIAGNOSIS — I2510 Atherosclerotic heart disease of native coronary artery without angina pectoris: Secondary | ICD-10-CM | POA: Insufficient documentation

## 2017-05-03 DIAGNOSIS — J439 Emphysema, unspecified: Secondary | ICD-10-CM | POA: Insufficient documentation

## 2017-05-03 DIAGNOSIS — I358 Other nonrheumatic aortic valve disorders: Secondary | ICD-10-CM | POA: Insufficient documentation

## 2017-05-03 DIAGNOSIS — R911 Solitary pulmonary nodule: Secondary | ICD-10-CM | POA: Insufficient documentation

## 2017-05-03 DIAGNOSIS — I700 Atherosclerosis of aorta: Secondary | ICD-10-CM | POA: Diagnosis not present

## 2017-05-03 LAB — GLUCOSE, CAPILLARY: Glucose-Capillary: 99 mg/dL (ref 65–99)

## 2017-05-03 MED ORDER — FLUDEOXYGLUCOSE F - 18 (FDG) INJECTION
12.8600 | Freq: Once | INTRAVENOUS | Status: AC | PRN
  Administered 2017-05-03: 12.86 via INTRAVENOUS

## 2017-06-03 ENCOUNTER — Encounter: Payer: Self-pay | Admitting: *Deleted

## 2017-06-06 ENCOUNTER — Ambulatory Visit: Payer: Medicare Other | Admitting: Anesthesiology

## 2017-06-06 ENCOUNTER — Encounter: Payer: Self-pay | Admitting: *Deleted

## 2017-06-06 ENCOUNTER — Encounter
Admission: RE | Disposition: A | Discharge status: Home / Self Care | Payer: Self-pay | Source: Ambulatory Visit | Attending: Gastroenterology

## 2017-06-06 ENCOUNTER — Ambulatory Visit
Admission: RE | Admit: 2017-06-06 | Discharge: 2017-06-06 | Disposition: A | Discharge status: Home / Self Care | Payer: Medicare Other | Source: Ambulatory Visit | Attending: Gastroenterology | Admitting: Gastroenterology

## 2017-06-06 DIAGNOSIS — K317 Polyp of stomach and duodenum: Secondary | ICD-10-CM | POA: Insufficient documentation

## 2017-06-06 DIAGNOSIS — I10 Essential (primary) hypertension: Secondary | ICD-10-CM | POA: Diagnosis not present

## 2017-06-06 DIAGNOSIS — B3781 Candidal esophagitis: Secondary | ICD-10-CM | POA: Diagnosis not present

## 2017-06-06 DIAGNOSIS — M1990 Unspecified osteoarthritis, unspecified site: Secondary | ICD-10-CM | POA: Diagnosis not present

## 2017-06-06 DIAGNOSIS — R569 Unspecified convulsions: Secondary | ICD-10-CM | POA: Diagnosis not present

## 2017-06-06 DIAGNOSIS — Z888 Allergy status to other drugs, medicaments and biological substances status: Secondary | ICD-10-CM | POA: Insufficient documentation

## 2017-06-06 DIAGNOSIS — F329 Major depressive disorder, single episode, unspecified: Secondary | ICD-10-CM | POA: Insufficient documentation

## 2017-06-06 DIAGNOSIS — K2950 Unspecified chronic gastritis without bleeding: Secondary | ICD-10-CM | POA: Insufficient documentation

## 2017-06-06 DIAGNOSIS — K2270 Barrett's esophagus without dysplasia: Secondary | ICD-10-CM | POA: Diagnosis not present

## 2017-06-06 DIAGNOSIS — J449 Chronic obstructive pulmonary disease, unspecified: Secondary | ICD-10-CM | POA: Insufficient documentation

## 2017-06-06 DIAGNOSIS — K219 Gastro-esophageal reflux disease without esophagitis: Secondary | ICD-10-CM | POA: Diagnosis present

## 2017-06-06 DIAGNOSIS — K210 Gastro-esophageal reflux disease with esophagitis: Secondary | ICD-10-CM | POA: Diagnosis not present

## 2017-06-06 DIAGNOSIS — Z79899 Other long term (current) drug therapy: Secondary | ICD-10-CM | POA: Insufficient documentation

## 2017-06-06 DIAGNOSIS — K2980 Duodenitis without bleeding: Secondary | ICD-10-CM | POA: Insufficient documentation

## 2017-06-06 HISTORY — PX: ESOPHAGOGASTRODUODENOSCOPY (EGD) WITH PROPOFOL: SHX5813

## 2017-06-06 SURGERY — ESOPHAGOGASTRODUODENOSCOPY (EGD) WITH PROPOFOL
Anesthesia: General

## 2017-06-06 MED ORDER — PROPOFOL 500 MG/50ML IV EMUL
INTRAVENOUS | Status: DC | PRN
  Administered 2017-06-06: 75 ug/kg/min via INTRAVENOUS

## 2017-06-06 MED ORDER — IPRATROPIUM-ALBUTEROL 0.5-2.5 (3) MG/3ML IN SOLN: 3.0000 mL | Freq: Four times a day (QID) | RESPIRATORY_TRACT | Status: DC

## 2017-06-06 MED ORDER — SODIUM CHLORIDE 0.9 % IV SOLN
INTRAVENOUS | Status: DC
  Administered 2017-06-06: 11:00:00 via INTRAVENOUS

## 2017-06-06 MED ORDER — GLYCOPYRROLATE 0.2 MG/ML IJ SOLN
INTRAMUSCULAR | Status: DC | PRN
  Administered 2017-06-06: 0.2 mg via INTRAVENOUS

## 2017-06-06 MED ORDER — PROPOFOL 10 MG/ML IV BOLUS
INTRAVENOUS | Status: DC | PRN
  Administered 2017-06-06: 10 mg via INTRAVENOUS
  Administered 2017-06-06: 20 mg via INTRAVENOUS
  Administered 2017-06-06: 30 mg via INTRAVENOUS
  Administered 2017-06-06: 20 mg via INTRAVENOUS

## 2017-06-06 MED ORDER — SODIUM CHLORIDE 0.9 % IV SOLN
INTRAVENOUS | Status: DC
  Administered 2017-06-06: 1000 mL via INTRAVENOUS

## 2017-06-06 MED ORDER — DEXMEDETOMIDINE HCL 200 MCG/2ML IV SOLN
INTRAVENOUS | Status: DC | PRN
  Administered 2017-06-06: 4 ug via INTRAVENOUS

## 2017-06-06 MED ORDER — PROPOFOL 500 MG/50ML IV EMUL
INTRAVENOUS | Status: AC
  Filled 2017-06-06: qty 50

## 2017-06-06 MED ORDER — IPRATROPIUM-ALBUTEROL 0.5-2.5 (3) MG/3ML IN SOLN
RESPIRATORY_TRACT | Status: AC
  Administered 2017-06-06: 3 mL
  Filled 2017-06-06: qty 3

## 2017-06-06 NOTE — Transfer of Care (Signed)
Immediate Anesthesia Transfer of Care Note  Patient: FARAH BENISH  Procedure(s) Performed: ESOPHAGOGASTRODUODENOSCOPY (EGD) WITH PROPOFOL (N/A )  Patient Location: PACU and Endoscopy Unit  Anesthesia Type:General  Level of Consciousness: awake, alert  and oriented  Airway & Oxygen Therapy: Patient Spontanous Breathing and Patient connected to nasal cannula oxygen  Post-op Assessment: Report given to RN and Post -op Vital signs reviewed and stable  Post vital signs: Reviewed and stable  Last Vitals:  Vitals:   06/06/17 1057 06/06/17 1150  BP: 112/79 95/68  Pulse: 94 94  Resp: 16 (!) 24  Temp: 36.6 C (!) 35.8 C  SpO2: 98% 93%    Last Pain:  Vitals:   06/06/17 1150  TempSrc: Tympanic  PainSc:          Complications: No apparent anesthesia complications

## 2017-06-06 NOTE — Anesthesia Postprocedure Evaluation (Signed)
Anesthesia Post Note  Patient: GERHARD RAPPAPORT  Procedure(s) Performed: ESOPHAGOGASTRODUODENOSCOPY (EGD) WITH PROPOFOL (N/A )  Patient location during evaluation: Endoscopy Anesthesia Type: General Level of consciousness: awake and alert Pain management: pain level controlled Vital Signs Assessment: post-procedure vital signs reviewed and stable Respiratory status: spontaneous breathing, nonlabored ventilation, respiratory function stable and patient connected to nasal cannula oxygen Cardiovascular status: blood pressure returned to baseline and stable Postop Assessment: no apparent nausea or vomiting Anesthetic complications: no     Last Vitals:  Vitals:   06/06/17 1200 06/06/17 1220  BP: 110/72 (!) 131/58  Pulse: 87 80  Resp: (!) 25 (!) 22  Temp:    SpO2: 94% 99%    Last Pain:  Vitals:   06/06/17 1150  TempSrc: Tympanic  PainSc:                  Glennys Schorsch S

## 2017-06-06 NOTE — Anesthesia Post-op Follow-up Note (Signed)
Anesthesia QCDR form completed.        

## 2017-06-06 NOTE — Op Note (Signed)
[facility] Patient Name: Steve Gregory Procedure Date: 06/06/2017 11:16 AM MRN: [id number] Account #: [id number] Date of Birth: 30-Dec-1955 Admit Type: Outpatient Age: 61 Room: [facility] ENDO ROOM 1 Gender: Male Note Status: Finalized Procedure:            Upper GI endoscopy Indications:          Gastro-esophageal reflux disease, Follow-up of                        Barrett's esophagus Providers:            [name], MD Referring MD:         Ocie Cornfield. Ouida Sills MD, MD (Referring MD) Medicines:            Monitored Anesthesia Care Complications:        No immediate complications. Procedure:            Pre-Anesthesia Assessment:                       - ASA Grade Assessment: III - A patient with severe                        systemic disease.                       After obtaining informed consent, the endoscope was                        passed under direct vision. Throughout the procedure,                        the patient's blood pressure, pulse, and oxygen                        saturations were monitored continuously. The Endoscope                        was introduced through the mouth, and advanced to the                        third part of duodenum. The upper GI endoscopy was                        accomplished without difficulty. The patient tolerated                        the procedure well. Findings:      Patchy candidiasis was found in the middle third of the esophagus.      There were esophageal mucosal changes consistent with short-segment       Barrett's esophagus present in the distal esophagus. The maximum       longitudinal extent of these mucosal changes was 1 cm in length. Mucosa       was biopsied with a cold forceps for histology in 4 quadrants. One       specimen bottle was sent to pathology.      patulous [place]      The exam of the esophagus was otherwise normal.      Minimal inflammation characterized by  congestion (edema) and erythema       was found in the gastric body.  Biopsies were taken with a cold forceps       for histology.      Patchy mild inflammation characterized by congestion (edema) and       granularity was found in the duodenal bulb.      Two 2 to 3 mm sessile polyps were found in the duodenal bulb. The polyp       was removed with a cold biopsy forceps. Resection and retrieval were       complete.      The exam of the duodenum was otherwise normal. Impression:           - Monilial esophagitis.                       - Esophageal mucosal changes consistent with                        short-segment Barrett's esophagus. Biopsied.                       - Gastritis. Biopsied.                       - Duodenitis.                       - Two duodenal polyps. Resected and retrieved. Recommendation:       - Await pathology results.                       - Mycelex (clotrimazole) 10 mg lozenge 5x/day for 5                        days.                       - Return to GI clinic in 1 month. Procedure Code(s):    --- Professional ---                       [phone], Esophagogastroduodenoscopy, flexible, transoral;                        with biopsy, single or multiple Diagnosis Code(s):    --- Professional ---                       B37.81, Candidal esophagitis                       K22.70, Barrett's esophagus without dysplasia                       K29.70, Gastritis, unspecified, without bleeding                       K29.80, Duodenitis without bleeding                       K31.7, Polyp of stomach and duodenum                       K21.9, Gastro-esophageal reflux disease without                        esophagitis CPT copyright 2016 American Medical  Association. All rights reserved. The codes documented in this report are preliminary and upon coder review may  be revised to meet current compliance requirements. [name], MD 06/06/2017 11:56:16 AM This report has been signed  electronically. Number of Addenda: 0 Note Initiated On: 06/06/2017 11:16 AM      [facility]

## 2017-06-06 NOTE — Anesthesia Preprocedure Evaluation (Addendum)
Anesthesia Evaluation  Patient identified by MRN, date of birth, ID band Patient awake    Reviewed: Allergy & Precautions, NPO status , Patient's Chart, lab work & pertinent test results, reviewed documented beta blocker date and time   Airway Mallampati: II  TM Distance: >3 FB     Dental  (+) Chipped, Dental Advisory Given, Poor Dentition, Partial Lower, Partial Upper   Pulmonary shortness of breath, pneumonia, resolved, COPD, Current Smoker,           Cardiovascular hypertension, Pt. on medications + Orthopnea       Neuro/Psych  Headaches, Seizures -,  PSYCHIATRIC DISORDERS Depression    GI/Hepatic hiatal hernia, GERD  ,  Endo/Other    Renal/GU      Musculoskeletal  (+) Arthritis ,   Abdominal   Peds  Hematology   Anesthesia Other Findings Pt denies seizures. Will give duoneb nebulizer.  Reproductive/Obstetrics                            Anesthesia Physical Anesthesia Plan  ASA: III  Anesthesia Plan: General   Post-op Pain Management:    Induction: Intravenous  PONV Risk Score and Plan:   Airway Management Planned:   Additional Equipment:   Intra-op Plan:   Post-operative Plan:   Informed Consent: I have reviewed the patients History and Physical, chart, labs and discussed the procedure including the risks, benefits and alternatives for the proposed anesthesia with the patient or authorized representative who has indicated his/her understanding and acceptance.     Plan Discussed with: CRNA  Anesthesia Plan Comments:         Anesthesia Quick Evaluation

## 2017-06-06 NOTE — H&P (Signed)
Outpatient short stay form Pre-procedure 06/06/2017 11:02 AM [name] MD  Primary Physician: Dr. Frazier Richards  Reason for visit:  EGD  History of present illness:  Patient is a 61 year old male presenting today as above. He is presenting today with complaint of increased symptomatic GERD and epigastric pain. This may awaken him at night at times. He does take meloxicam regularly. He takes no other NSAID, aspirin or blood thinning agents. It is of note that he has a poor respiratory status with COPD and was seen by pulmonology prior to this procedure.    Current Facility-Administered Medications:  .  0.9 %  sodium chloride infusion, , Intravenous, Continuous, [name], MD .  0.9 %  sodium chloride infusion, , Intravenous, Continuous, [name], MD .  ipratropium-albuterol (DUONEB) 0.5-2.5 (3) MG/3ML nebulizer solution 3 mL, 3 mL, Nebulization, Q6H, [name], MD  Prescriptions Prior to Admission  Medication Sig Dispense Refill Last Dose  . albuterol (PROVENTIL HFA;VENTOLIN HFA) 108 (90 Base) MCG/ACT inhaler Inhale 2 puffs into the lungs every 6 (six) hours as needed for wheezing or shortness of breath.   06/05/2017 at Unknown time  . ipratropium-albuterol (DUONEB) 0.5-2.5 (3) MG/3ML SOLN Take 3 mLs by nebulization.     Marland Kitchen losartan (COZAAR) 25 MG tablet Take 25 mg by mouth daily.   06/05/2017 at Unknown time  . montelukast (SINGULAIR) 10 MG tablet Take 10 mg by mouth every morning.   06/05/2017 at Unknown time  . pantoprazole (PROTONIX) 40 MG tablet Take 40 mg by mouth daily.   06/05/2017 at Unknown time  . predniSONE (DELTASONE) 10 MG tablet Take 15 mg by mouth daily with breakfast.   06/05/2017 at Unknown time  . theophylline (UNIPHYL) 400 MG 24 hr tablet Take 400 mg by mouth daily.   06/05/2017 at Unknown time  . [DISCONTINUED] celecoxib (CELEBREX) 100 MG capsule Take 100 mg by mouth 2 (two) times daily.     . [DISCONTINUED] cholecalciferol (VITAMIN D)  400 units TABS tablet Take 2,000 Units by mouth.     . [DISCONTINUED] ranitidine (ZANTAC) 300 MG tablet Take 300 mg by mouth at bedtime.     Marland Kitchen albuterol (PROVENTIL) (2.5 MG/3ML) 0.083% nebulizer solution Take 2.5 mg by nebulization every 6 (six) hours as needed for wheezing or shortness of breath.   11/16/2015 at Unknown time  . fluticasone (FLONASE) 50 MCG/ACT nasal spray Place 2 sprays into both nostrils daily.   11/17/2015 at Unknown time  . Fluticasone-Salmeterol (ADVAIR) 250-50 MCG/DOSE AEPB Inhale 1 puff into the lungs 2 (two) times daily.   11/17/2015 at Unknown time  . ipratropium (ATROVENT HFA) 17 MCG/ACT inhaler Inhale into the lungs 3 (three) times daily.   11/17/2015 at Unknown time  . [DISCONTINUED] lidocaine (LIDODERM) 5 % Place 1 patch onto the skin daily. Remove & Discard patch within 12 hours or as directed by MD 30 patch 0   . [DISCONTINUED] potassium chloride (K-DUR) 10 MEQ tablet Take 10 mEq by mouth daily.   11/16/2015 at Unknown time     Allergies  Allergen Reactions  . Chantix [Varenicline] Swelling    Caused tongue swelling.   . Lipitor [Atorvastatin] Swelling    Swelling of tongue  . Mobic [Meloxicam]   . Statins      Past Medical History:  Diagnosis Date  . Arthritis   . Bronchitis, chronic ([place])   . COPD (chronic obstructive pulmonary disease) ([place])   . Cough    CHRONIC  .  Depression   . Dizziness   . GERD (gastroesophageal reflux disease)   . H/O emphysema   . Headache   . History of hiatal hernia   . HOH (hard of hearing)   . Hypertension   . Orthopnea   . Oxygen decrease    H/O HOME USE, NOT NOW  . Pneumonia    in the past  . Seizures ([place])    sounds like it could be vagal at times, coughing can cause him to fade out and see white lights. had last sz 3 months ago when not taking meds  . Shortness of breath dyspnea    uses many different inhalers  . Wheezing     Review of systems:      Physical Exam    Heart and lungs: Regular rate and  rhythm without rub or gallop, lungs are bilaterally wheezy with some occasional coarse rhonchi    HEENT: Normocephalic atraumatic eyes are anicteric    Other:     Pertinant exam for procedure: Soft nontender nondistended bowel sounds positive normoactive    Planned proceedures: EGD and indicated procedures. I have discussed the risks benefits and complications of procedures to include not limited to bleeding, infection, perforation and the risk of sedation and the patient wishes to proceed.    [name], MD Gastroenterology 06/06/2017  11:02 AM

## 2017-06-07 ENCOUNTER — Encounter: Payer: Self-pay | Admitting: Gastroenterology

## 2017-06-07 LAB — SURGICAL PATHOLOGY

## 2017-06-28 ENCOUNTER — Encounter (INDEPENDENT_AMBULATORY_CARE_PROVIDER_SITE_OTHER): Payer: Self-pay | Admitting: Vascular Surgery

## 2017-06-28 ENCOUNTER — Ambulatory Visit (INDEPENDENT_AMBULATORY_CARE_PROVIDER_SITE_OTHER): Payer: Medicare Other | Admitting: Vascular Surgery

## 2017-06-28 VITALS — BP 135/74 | HR 90 | Resp 16 | Ht 69.5 in | Wt 164.0 lb

## 2017-06-28 DIAGNOSIS — I83813 Varicose veins of bilateral lower extremities with pain: Secondary | ICD-10-CM | POA: Diagnosis not present

## 2017-06-28 DIAGNOSIS — I10 Essential (primary) hypertension: Secondary | ICD-10-CM

## 2017-06-28 DIAGNOSIS — M79604 Pain in right leg: Secondary | ICD-10-CM | POA: Diagnosis not present

## 2017-06-28 DIAGNOSIS — M79605 Pain in left leg: Secondary | ICD-10-CM

## 2017-06-28 DIAGNOSIS — F17200 Nicotine dependence, unspecified, uncomplicated: Secondary | ICD-10-CM | POA: Insufficient documentation

## 2017-06-28 HISTORY — DX: Varicose veins of bilateral lower extremities with pain: I83.813

## 2017-06-28 HISTORY — DX: Essential (primary) hypertension: I10

## 2017-06-28 NOTE — Progress Notes (Signed)
Subjective:    Patient ID: Steve Gregory, male    DOB: 1956-08-20, 61 y.o.   MRN: [id number] Chief Complaint  Patient presents with  . New Patient (Initial Visit)    ref Ouida Sills left ankle broken vessel   Presents as a new patient referred by Dr. Ouida Sills for "broken blood vessels on the left ankle". The patient endorses a history of broken "blood vessels" located on his left ankle which "burst" approximately one month ago. After the vessels "bust" a "bruise formed". This was not associated with any discomfort. The patient denies any bilateral lower extremity edema. At this time, the patient does not engage in conservative therapy. Patient does note he experiences bilateral foot cramping with activity. This cramping is lifestyle limiting. The patient states she is unable to mow his lawn without stopping multiple times due to the discomfort in severity of the cramping. Patient states his left lower extremity is more affected when compared to his right. The patient denies any rest pain or ulceration to the lower extremity. Patient denies any fever, nausea or vomiting.   Review of Systems  Constitutional: Negative.   HENT: Negative.   Eyes: Negative.   Respiratory: Negative.   Cardiovascular:       Bilateral lower extremity pain  Gastrointestinal: Negative.   Endocrine: Negative.   Genitourinary: Negative.   Musculoskeletal: Negative.   Skin: Negative.   Allergic/Immunologic: Negative.   Neurological: Negative.   Hematological: Negative.   Psychiatric/Behavioral: Negative.       Objective:   Physical Exam  Constitutional: He is oriented to person, place, and time. He appears well-developed and well-nourished. No distress.  HENT:  Head: Normocephalic and atraumatic.  Eyes: Pupils are equal, round, and reactive to light. Conjunctivae are normal.  Neck: Normal range of motion.  Cardiovascular: Normal rate, regular rhythm, normal heart sounds and intact distal pulses.   Pulses:  Radial pulses are 2+ on the right side, and 2+ on the left side.       Dorsalis pedis pulses are 1+ on the right side, and 1+ on the left side.       Posterior tibial pulses are 1+ on the right side, and 1+ on the left side.  Pulmonary/Chest: Effort normal.  Musculoskeletal: Normal range of motion. He exhibits no edema.  Neurological: He is alert and oriented to person, place, and time.  Skin: Skin is warm and dry. He is not diaphoretic.  Patient with less than 1cm scattered varicosities to the bilateral lower extremity. There is no stasis dermatitis. There is no skin changes. There is no cellulitis.  Psychiatric: He has a normal mood and affect. His behavior is normal. Judgment and thought content normal.   BP 135/74 (BP Location: Right Arm)   Pulse 90   Resp 16   Ht 5' 9.5" (1.765 m)   Wt 164 lb (74.4 kg)   BMI 23.87 kg/m   Past Medical History:  Diagnosis Date  . Arthritis   . Bronchitis, chronic ([place])   . COPD (chronic obstructive pulmonary disease) ([place])   . Cough    CHRONIC  . Depression   . Dizziness   . GERD (gastroesophageal reflux disease)   . H/O emphysema   . Headache   . History of hiatal hernia   . HOH (hard of hearing)   . Hypertension   . Orthopnea   . Oxygen decrease    H/O HOME USE, NOT NOW  . Pneumonia    in the past  .  Seizures ([facility])    sounds like it could be vagal at times, coughing can cause him to fade out and see white lights. had last sz 3 months ago when not taking meds  . Shortness of breath dyspnea    uses many different inhalers  . Wheezing    Social History   Social History  . Marital status: Single    Spouse name: N/A  . Number of children: N/A  . Years of education: N/A   Occupational History  . Not on file.   Social History Main Topics  . Smoking status: Current Every Day Smoker    Packs/day: 1.00    Years: 43.00  . Smokeless tobacco: Never Used  . Alcohol use No  . Drug use: No  . Sexual activity: Not on file   Other  Topics Concern  . Not on file   Social History Narrative  . No narrative on file   Past Surgical History:  Procedure Laterality Date  . CATARACT EXTRACTION W/PHACO Right 10/27/2015   Procedure: CATARACT EXTRACTION PHACO AND INTRAOCULAR LENS PLACEMENT ([place]) suture placed in right eye at end of procedure;  Surgeon: Estill Cotta, MD;  Location: [facility] ORS;  Service: Ophthalmology;  Laterality: Right;  [place] 01:02 AP% 24.4 CDE 28.12 fluid pack lot # [id number]  . CATARACT EXTRACTION W/PHACO Left 11/17/2015   Procedure: CATARACT EXTRACTION PHACO AND INTRAOCULAR LENS PLACEMENT ([place]);  Surgeon: Estill Cotta, MD;  Location: [facility] ORS;  Service: Ophthalmology;  Laterality: Left;  [place] 01:29 AP% 24.3 CDE 40.05 fluid pack lot # [id number]  . ESOPHAGOGASTRODUODENOSCOPY (EGD) WITH PROPOFOL N/A 06/06/2017   Procedure: ESOPHAGOGASTRODUODENOSCOPY (EGD) WITH PROPOFOL;  Surgeon: [name], MD;  Location: [facility] END[facility]COPY;  Service: Endoscopy;  Laterality: N/A;  . EYE SURGERY    . FACIAL RECONSTRUCTION SURGERY  1980   LEFT CHEEK BONE INJURY 1980   Family History  Problem Relation Age of Onset  . Hyperlipidemia Mother   . Stroke Father   . Aneurysm Father   . Heart disease Brother   . Hypertension Brother    Allergies  Allergen Reactions  . Chantix [Varenicline] Swelling    Caused tongue swelling.   . Lipitor [Atorvastatin] Swelling    Swelling of tongue  . Mobic [Meloxicam]   . Statins       Assessment & Plan:  Presents as a new patient referred by Dr. Ouida Sills for "broken blood vessels on the left ankle". The patient endorses a history of broken "blood vessels" located on his left ankle which "burst" approximately one month ago. After the vessels "bust" a "bruise formed". This was not associated with any discomfort. The patient denies any bilateral lower extremity edema. At this time, the patient does not engage in conservative therapy. Patient does note he experiences bilateral foot  cramping with activity. This cramping is lifestyle limiting. The patient states she is unable to mow his lawn without stopping multiple times due to the discomfort in severity of the cramping. Patient states his left lower extremity is more affected when compared to his right. The patient denies any rest pain or ulceration to the lower extremity. Patient denies any fever, nausea or vomiting.  1. Lower extremity pain, bilateral - New Patient  with what sounds like claudication-like symptoms to the bilateral lower extremity. This discomfort is lifestyle limiting Faint pedal pulses on exam The patient has multiple risk factors for peripheral artery disease I will order an ABI to assess for any contributing peripheral artery disease  -  VAS [place] ABI WITH/WO TBI; Future  2. Varicose veins of both lower extremities with pain - New The patient was encouraged to wear graduated compression stockings (20-30 mmHg) on a daily basis. The patient was instructed to begin wearing the stockings first thing in the morning and removing them in the evening. The patient was instructed specifically not to sleep in the stockings. Prescription given In addition, behavioral modification including elevation during the day will be initiated. Anti-inflammatories for pain.  - VAS [place] LOWER EXTREMITY VENOUS REFLUX; Future  3. Essential hypertension - Stable Encouraged good control as its slows the progression of atherosclerotic disease  4. Tobacco dependence - Stable We had a discussion for approximately 10 minutes regarding the absolute need for smoking cessation due to the deleterious nature of tobacco on the vascular system. We discussed the tobacco use would diminish patency of any intervention, and likely significantly worsen progressio of disease. We discussed multiple agents for quitting including replacement therapy or medications to reduce cravings such as Chantix. The patient voices their understanding of the  importance of smoking cessation.  Current Outpatient Prescriptions on File Prior to Visit  Medication Sig Dispense Refill  . albuterol (PROVENTIL HFA;VENTOLIN HFA) 108 (90 Base) MCG/ACT inhaler Inhale 2 puffs into the lungs every 6 (six) hours as needed for wheezing or shortness of breath.    Marland Kitchen albuterol (PROVENTIL) (2.5 MG/3ML) 0.083% nebulizer solution Take 2.5 mg by nebulization every 6 (six) hours as needed for wheezing or shortness of breath.    . fluticasone (FLONASE) 50 MCG/ACT nasal spray Place 2 sprays into both nostrils daily.    . Fluticasone-Salmeterol (ADVAIR) 250-50 MCG/DOSE AEPB Inhale 1 puff into the lungs 2 (two) times daily.    Marland Kitchen ipratropium (ATROVENT HFA) 17 MCG/ACT inhaler Inhale into the lungs 3 (three) times daily.    Marland Kitchen ipratropium-albuterol (DUONEB) 0.5-2.5 (3) MG/3ML SOLN Take 3 mLs by nebulization.    Marland Kitchen losartan (COZAAR) 25 MG tablet Take 25 mg by mouth daily.    . montelukast (SINGULAIR) 10 MG tablet Take 10 mg by mouth every morning.    . pantoprazole (PROTONIX) 40 MG tablet Take 40 mg by mouth daily.    . predniSONE (DELTASONE) 10 MG tablet Take 15 mg by mouth daily with breakfast.    . theophylline (UNIPHYL) 400 MG 24 hr tablet Take 400 mg by mouth daily.     No current facility-administered medications on file prior to visit.    There are no Patient Instructions on file for this visit. No Follow-up on file.  Ashlynd Michna A Malahki Gasaway, PA-C

## 2017-07-11 ENCOUNTER — Ambulatory Visit: Payer: Self-pay | Admitting: Cardiothoracic Surgery

## 2017-07-11 DIAGNOSIS — C3402 Malignant neoplasm of left main bronchus: Secondary | ICD-10-CM | POA: Insufficient documentation

## 2017-07-11 DIAGNOSIS — R918 Other nonspecific abnormal finding of lung field: Secondary | ICD-10-CM | POA: Insufficient documentation

## 2017-07-15 ENCOUNTER — Ambulatory Visit (INDEPENDENT_AMBULATORY_CARE_PROVIDER_SITE_OTHER): Payer: Medicare Other | Admitting: Cardiothoracic Surgery

## 2017-07-15 ENCOUNTER — Encounter: Payer: Self-pay | Admitting: Cardiothoracic Surgery

## 2017-07-15 VITALS — BP 136/75 | HR 104 | Temp 97.4°F | Resp 14 | Ht 69.5 in | Wt 167.0 lb

## 2017-07-15 DIAGNOSIS — R918 Other nonspecific abnormal finding of lung field: Secondary | ICD-10-CM

## 2017-07-15 NOTE — Patient Instructions (Addendum)
We have sent in a referral to Dr. Donella Stade office for you to speak with them about possible Radiation Therapy.   We have also sent over a referral for you to see Dr. Gustavo Lah office for a colonoscopy as well.   If you do not hear anything from either office with in the next 48-72 business hours please give our office a call.  If you have any questions or concerns please give our office a call.

## 2017-07-15 NOTE — Progress Notes (Signed)
Patient ID: Steve Gregory, male   DOB: [birth year], 61 y.o.   MRN: [id number]  Chief Complaint  Patient presents with  . New Patient (Initial Visit)     new patient lung nodule referred by Dr. Raul Del    Referred By Dr. Raul Del Reason for Referral right upper lobe mass  HPI Location, Quality, Duration, Severity, Timing, Context, Modifying Factors, Associated Signs and Symptoms.  Steve Gregory is a 61 y.o. male.  This patient had a CT scan done back in January of this year for follow-up of his chronic COPD and bronchitis.  The CT scan showed a 5 mm right upper lobe nodule and a repeat CT scan performed later this year showed that it had increased in size to approximately 9-10 mm.  He then had a PET scan done which showed that the lesion was hypermetabolic consistent with a bronchogenic carcinoma.  The patient is a lifelong smoker.  He is currently on disability.  He was told that he needed oxygen therapy but could not afford that so he has stopped.  He does smoke about a pack cigarettes a day.  He had some pulmonary function studies done several months ago which revealed an FEV1 of approximately 60-65% of predicted.  He states that he gets short of breath with minimal activities.  He is unable to hold a job because of his shortness of breath.  He cannot walk up a flight of stairs.  Overall his appearance is such that he appears more short of breath and his pulmonary functions would suggest.  He comes in today to discuss treatment options for his enlarging right upper lobe nodule.   Past Medical History:  Diagnosis Date  . Arthritis   . Bronchitis, chronic ([place])   . COPD (chronic obstructive pulmonary disease) ([place])   . Cough    CHRONIC  . Depression   . Dizziness   . Essential hypertension 06/28/2017  . GERD (gastroesophageal reflux disease)   . H/O emphysema   . Headache   . History of depression 06/05/2014   Last Assessment & Plan:  Mood is doing well on medications.   . History of  hiatal hernia   . HOH (hard of hearing)   . Hypertension   . Orthopnea   . Oxygen decrease    H/O HOME USE, NOT NOW  . Pneumonia    in the past  . PVD (peripheral vascular disease) ([place]) 06/05/2014   Overview:  Smoker with poor pulses  Last Assessment & Plan:  No leg pain is noted of late.   . Seizures (HCC)    sounds like it could be vagal at times, coughing can cause him to fade out and see white lights. had last sz 3 months ago when not taking meds  . Shortness of breath dyspnea    uses many different inhalers  . Varicose veins of both lower extremities with pain 06/28/2017  . Wheezing     Past Surgical History:  Procedure Laterality Date  . CATARACT EXTRACTION PHACO AND INTRAOCULAR LENS PLACEMENT ([place]) Left 11/17/2015   Performed by Estill Cotta, MD at [facility] ORS  . CATARACT EXTRACTION PHACO AND INTRAOCULAR LENS PLACEMENT ([place]) suture placed in right eye at end of procedure Right 10/27/2015   Performed by Estill Cotta, MD at [facility] ORS  . ESOPHAGOGASTRODUODENOSCOPY (EGD) WITH PROPOFOL N/A 06/06/2017   Performed by [name], MD at [place]  . EYE SURGERY    . [place]  LEFT CHEEK BONE INJURY 1980    Family History  Problem Relation Age of Onset  . Hyperlipidemia Mother   . Stroke Father   . Aneurysm Father   . Heart disease Brother   . Hypertension Brother     Social History Social History   Tobacco Use  . Smoking status: Current Every Day Smoker    Packs/day: 1.00    Years: 43.00    Pack years: 43.00  . Smokeless tobacco: Never Used  Substance Use Topics  . Alcohol use: No  . Drug use: No    Allergies  Allergen Reactions  . Chantix [Varenicline] Swelling    Caused tongue swelling.   . Lipitor [Atorvastatin] Swelling    Swelling of tongue  . Mobic [Meloxicam]   . Statins     Current Outpatient Medications  Medication Sig Dispense Refill  . albuterol (PROVENTIL) (2.5 MG/3ML) 0.083% nebulizer solution  Take 2.5 mg by nebulization every 6 (six) hours as needed for wheezing or shortness of breath.    . celecoxib (CELEBREX) 100 MG capsule TAKE 1 CAPSULE BY MOUTH 2 TIMES DAILY AS NEEDED FOR PAIN.    Marland Kitchen Cholecalciferol (VITAMIN D) 2000 units tablet Take by mouth.    . clotrimazole (MYCELEX) 10 MG troche TAKE 1 TABLET BY MOUTH 5 TIMES DAILY FOR 10 DAYS. DISSOLVE THE TABLET SLOWLY AND COMPLETELY IN MOUTH  0  . fluticasone (FLONASE) 50 MCG/ACT nasal spray Place 2 sprays into both nostrils daily.    . Fluticasone-Salmeterol (ADVAIR) 250-50 MCG/DOSE AEPB Inhale 1 puff into the lungs 2 (two) times daily.    Marland Kitchen ipratropium (ATROVENT HFA) 17 MCG/ACT inhaler Inhale into the lungs 3 (three) times daily.    Marland Kitchen ipratropium-albuterol (DUONEB) 0.5-2.5 (3) MG/3ML SOLN Take 3 mLs by nebulization.    Marland Kitchen losartan (COZAAR) 25 MG tablet Take 25 mg by mouth daily.    . meloxicam (MOBIC) 7.5 MG tablet Take by mouth.    . montelukast (SINGULAIR) 10 MG tablet Take 10 mg by mouth every morning.    . pantoprazole (PROTONIX) 40 MG tablet Take 40 mg by mouth daily.    . predniSONE (DELTASONE) 10 MG tablet Take 15 mg by mouth daily with breakfast.    . theophylline (UNIPHYL) 400 MG 24 hr tablet Take 400 mg by mouth daily.     No current facility-administered medications for this visit.       Review of Systems A complete review of systems was asked and was negative except for the following positive findings chest pain, cough, shortness of breath, wheezing, heartburn, joint pain, skin spots, depression, excessive thirst, easy bruising  Blood pressure 136/75, pulse (!) 104, temperature (!) 97.4 F (36.3 C), temperature source Oral, resp. rate 14, height 5' 9.5" (1.765 m), weight 167 lb (75.8 kg), SpO2 94 %.  Physical Exam CONSTITUTIONAL:  Pleasant, well-developed, well-nourished, and in no minimal to moderate respiratory distress.  He does appear older than his stated age. EYES: Pupils equal and reactive to light, Sclera  non-icteric EARS, NOSE, MOUTH AND THROAT:  The oropharynx was clear.  Dentition is in poor repair.  Oral mucosa pink and moist. LYMPH NODES:  Lymph nodes in the neck and axillae were normal RESPIRATORY:  Lungs demonstrated bilateral inspiratory and expiratory wheezes.  Normal respiratory effort without pathologic use of accessory muscles of respiration CARDIOVASCULAR: Heart was regular without murmurs.  There were no carotid bruits. GI: The abdomen was soft, nontender, and nondistended. There were no palpable masses. There was no  hepatosplenomegaly. There were normal bowel sounds in all quadrants. GU:  Rectal deferred.   MUSCULOSKELETAL:  Normal muscle strength and tone.  No clubbing or cyanosis.   SKIN:  There were no pathologic skin lesions.  There were no nodules on palpation. NEUROLOGIC:  Sensation is normal.  Cranial nerves are grossly intact. PSYCH:  Oriented to person, place and time.  Mood and affect are normal.  Data Reviewed CT scans and PET scan  I have personally reviewed the patient's imaging, laboratory findings and medical records.    Assessment    I have independently reviewed the patient's CT scan and PET scan.  There has been slight enlargement in the right upper lobe nodule consistent with bronchogenic carcinoma.    Plan    I explained to the patient in detail the indications and risks of thoracoscopy possible thoracotomy with lung resection.  Given his advanced age of COPD he is certainly at high risk for surgery.  He continues to smoke which also has a negative predictor for successful outcome.  I did review with him the option of radiation therapy.  I told the patient that he would need to meet with Dr. Donella Stade to discuss whether or not he would be a candidate for radiation therapy.  He also scheduled to see Dr. Gustavo Lah for his PET positive anal lesion.  We will facilitate these referrals.  He will come back to see me once he makes up his mind as to how he would like to  proceed for management of his right upper lobe nodule.  All of his questions were answered today.       Nestor Lewandowsky, MD 07/15/2017, 11:42 AM

## 2017-07-27 ENCOUNTER — Encounter: Payer: Self-pay | Admitting: *Deleted

## 2017-07-28 ENCOUNTER — Ambulatory Visit: Payer: Medicare Other | Admitting: Anesthesiology

## 2017-07-28 ENCOUNTER — Encounter
Admission: RE | Disposition: A | Discharge status: Home / Self Care | Payer: Self-pay | Source: Ambulatory Visit | Attending: Gastroenterology

## 2017-07-28 ENCOUNTER — Ambulatory Visit
Admission: RE | Admit: 2017-07-28 | Discharge: 2017-07-28 | Disposition: A | Discharge status: Home / Self Care | Payer: Medicare Other | Source: Ambulatory Visit | Attending: Gastroenterology | Admitting: Gastroenterology

## 2017-07-28 ENCOUNTER — Encounter: Payer: Self-pay | Admitting: *Deleted

## 2017-07-28 DIAGNOSIS — F329 Major depressive disorder, single episode, unspecified: Secondary | ICD-10-CM | POA: Diagnosis not present

## 2017-07-28 DIAGNOSIS — F17200 Nicotine dependence, unspecified, uncomplicated: Secondary | ICD-10-CM | POA: Insufficient documentation

## 2017-07-28 DIAGNOSIS — Z888 Allergy status to other drugs, medicaments and biological substances status: Secondary | ICD-10-CM | POA: Insufficient documentation

## 2017-07-28 DIAGNOSIS — I739 Peripheral vascular disease, unspecified: Secondary | ICD-10-CM | POA: Insufficient documentation

## 2017-07-28 DIAGNOSIS — M1990 Unspecified osteoarthritis, unspecified site: Secondary | ICD-10-CM | POA: Insufficient documentation

## 2017-07-28 DIAGNOSIS — Z79899 Other long term (current) drug therapy: Secondary | ICD-10-CM | POA: Diagnosis not present

## 2017-07-28 DIAGNOSIS — K621 Rectal polyp: Secondary | ICD-10-CM | POA: Insufficient documentation

## 2017-07-28 DIAGNOSIS — K635 Polyp of colon: Secondary | ICD-10-CM | POA: Insufficient documentation

## 2017-07-28 DIAGNOSIS — K219 Gastro-esophageal reflux disease without esophagitis: Secondary | ICD-10-CM | POA: Diagnosis not present

## 2017-07-28 DIAGNOSIS — I10 Essential (primary) hypertension: Secondary | ICD-10-CM | POA: Diagnosis not present

## 2017-07-28 DIAGNOSIS — Z8719 Personal history of other diseases of the digestive system: Secondary | ICD-10-CM | POA: Insufficient documentation

## 2017-07-28 DIAGNOSIS — J449 Chronic obstructive pulmonary disease, unspecified: Secondary | ICD-10-CM | POA: Diagnosis not present

## 2017-07-28 DIAGNOSIS — R933 Abnormal findings on diagnostic imaging of other parts of digestive tract: Secondary | ICD-10-CM | POA: Diagnosis present

## 2017-07-28 DIAGNOSIS — K5730 Diverticulosis of large intestine without perforation or abscess without bleeding: Secondary | ICD-10-CM | POA: Insufficient documentation

## 2017-07-28 DIAGNOSIS — D125 Benign neoplasm of sigmoid colon: Secondary | ICD-10-CM | POA: Diagnosis not present

## 2017-07-28 HISTORY — DX: Other nonspecific abnormal finding of lung field: R91.8

## 2017-07-28 HISTORY — PX: COLONOSCOPY WITH PROPOFOL: SHX5780

## 2017-07-28 SURGERY — COLONOSCOPY WITH PROPOFOL
Anesthesia: General

## 2017-07-28 MED ORDER — PROPOFOL 500 MG/50ML IV EMUL
INTRAVENOUS | Status: DC | PRN
  Administered 2017-07-28: 160 ug/kg/min via INTRAVENOUS

## 2017-07-28 MED ORDER — SODIUM CHLORIDE 0.9 % IV SOLN
INTRAVENOUS | Status: DC
  Administered 2017-07-28: 1000 mL via INTRAVENOUS

## 2017-07-28 MED ORDER — FENTANYL CITRATE (PF) 100 MCG/2ML IJ SOLN
INTRAMUSCULAR | Status: DC | PRN
  Administered 2017-07-28: 50 ug via INTRAVENOUS

## 2017-07-28 MED ORDER — PROPOFOL 10 MG/ML IV BOLUS
INTRAVENOUS | Status: DC | PRN
  Administered 2017-07-28: 40 mg via INTRAVENOUS

## 2017-07-28 MED ORDER — EPHEDRINE SULFATE 50 MG/ML IJ SOLN
INTRAMUSCULAR | Status: DC | PRN
  Administered 2017-07-28 (×2): 10 mg via INTRAVENOUS

## 2017-07-28 MED ORDER — EPHEDRINE SULFATE 50 MG/ML IJ SOLN
INTRAMUSCULAR | Status: AC
  Filled 2017-07-28: qty 1

## 2017-07-28 MED ORDER — MIDAZOLAM HCL 2 MG/2ML IJ SOLN
INTRAMUSCULAR | Status: AC
  Filled 2017-07-28: qty 2

## 2017-07-28 MED ORDER — MIDAZOLAM HCL 2 MG/2ML IJ SOLN
INTRAMUSCULAR | Status: DC | PRN
  Administered 2017-07-28: 2 mg via INTRAVENOUS

## 2017-07-28 MED ORDER — LACTATED RINGERS IV SOLN
INTRAVENOUS | Status: DC | PRN
  Administered 2017-07-28: 15:00:00 via INTRAVENOUS

## 2017-07-28 MED ORDER — FENTANYL CITRATE (PF) 100 MCG/2ML IJ SOLN
INTRAMUSCULAR | Status: AC
  Filled 2017-07-28: qty 2

## 2017-07-28 MED ORDER — PROPOFOL 10 MG/ML IV BOLUS
INTRAVENOUS | Status: AC
  Filled 2017-07-28: qty 20

## 2017-07-28 MED ORDER — SODIUM CHLORIDE 0.9 % IV SOLN: INTRAVENOUS | Status: DC

## 2017-07-28 NOTE — H&P (Signed)
Outpatient short stay form Pre-procedure 07/28/2017 2:47 PM Steve Sails MD  Primary Physician: Dr. Frazier Richards, Dr. Graciela Husbands  Reason for visit:   Colonoscopy  History of present illness:   Patient is a 61 year old male presenting today as above. Has a history of being followed for abnormal/suspicious lung nodule and recently underwent a PET scan. There is a finding of an abnormal hypermetabolic area in the rectal/anal area. He does have a personal history of adenomatous colon polyps with last procedure being done about 3 years ago. He is presenting today for further evaluation. He tolerated his prep well. He takes 81 mg aspirin but denies any other aspirin use or blood thinning agent.    Current Facility-Administered Medications:  .  0.9 %  sodium chloride infusion, , Intravenous, Continuous, Steve Sails, MD, Last Rate: 20 mL/hr at 07/28/17 1327, 1,000 mL at 07/28/17 1327 .  0.9 %  sodium chloride infusion, , Intravenous, Continuous, Steve Sails, MD  Medications Prior to Admission  Medication Sig Dispense Refill Last Dose  . albuterol (PROVENTIL) (2.5 MG/3ML) 0.083% nebulizer solution Take 2.5 mg by nebulization every 6 (six) hours as needed for wheezing or shortness of breath.   07/27/2017 at Unknown time  . Cholecalciferol (VITAMIN D) 2000 units tablet Take by mouth.   Past Week at Unknown time  . fluticasone (FLONASE) 50 MCG/ACT nasal spray Place 2 sprays into both nostrils daily.   07/27/2017 at Unknown time  . Fluticasone-Salmeterol (ADVAIR) 250-50 MCG/DOSE AEPB Inhale 1 puff into the lungs 2 (two) times daily.   07/27/2017 at Unknown time  . losartan (COZAAR) 25 MG tablet Take 25 mg by mouth daily.   07/27/2017 at Unknown time  . montelukast (SINGULAIR) 10 MG tablet Take 10 mg by mouth every morning.   07/27/2017 at Unknown time  . pantoprazole (PROTONIX) 40 MG tablet Take 40 mg by mouth daily.   07/27/2017 at Unknown time  . predniSONE (DELTASONE) 10 MG  tablet Take 15 mg by mouth daily with breakfast.   07/27/2017 at Unknown time  . theophylline (UNIPHYL) 400 MG 24 hr tablet Take 400 mg by mouth daily.   07/27/2017 at Unknown time  . celecoxib (CELEBREX) 100 MG capsule TAKE 1 CAPSULE BY MOUTH 2 TIMES DAILY AS NEEDED FOR PAIN.   Not Taking at Unknown time  . clotrimazole (MYCELEX) 10 MG troche TAKE 1 TABLET BY MOUTH 5 TIMES DAILY FOR 10 DAYS. DISSOLVE THE TABLET SLOWLY AND COMPLETELY IN MOUTH  0 Not Taking at Unknown time  . ipratropium (ATROVENT HFA) 17 MCG/ACT inhaler Inhale into the lungs 3 (three) times daily.   Not Taking at Unknown time  . ipratropium-albuterol (DUONEB) 0.5-2.5 (3) MG/3ML SOLN Take 3 mLs by nebulization.   Taking  . meloxicam (MOBIC) 7.5 MG tablet Take by mouth.   Not Taking at Unknown time     Allergies  Allergen Reactions  . Chantix [Varenicline] Swelling    Caused tongue swelling.   . Lipitor [Atorvastatin] Swelling    Swelling of tongue  . Mobic [Meloxicam]   . Statins      Past Medical History:  Diagnosis Date  . Arthritis   . Bronchitis, chronic ([place])   . COPD (chronic obstructive pulmonary disease) ([place])   . Cough    CHRONIC  . Depression   . Dizziness   . Essential hypertension 06/28/2017  . GERD (gastroesophageal reflux disease)   . H/O emphysema   . Headache   . History of depression  06/05/2014   Last Assessment & Plan:  Mood is doing well on medications.   . History of hiatal hernia   . HOH (hard of hearing)   . Hypertension   . Lung mass   . Orthopnea   . Oxygen decrease    H/O HOME USE, NOT NOW  . Pneumonia    in the past  . PVD (peripheral vascular disease) ([place]) 06/05/2014   Overview:  Smoker with poor pulses  Last Assessment & Plan:  No leg pain is noted of late.   . Seizures (HCC)    sounds like it could be vagal at times, coughing can cause him to fade out and see white lights. had last sz 3 months ago when not taking meds  . Shortness of breath dyspnea    uses many different  inhalers  . Varicose veins of both lower extremities with pain 06/28/2017  . Wheezing     Review of systems:      Physical Exam    Heart and lungs:  regular rate and rhythm without rub or gallop, lungs are bilaterally clear.    HEENT:  normocephalic atraumatic eyes are anicteric    Other:     Pertinant exam for procedure:  soft, mild discomfort to palpation in the left abdomen particularly left lower quadrant. There are no masses or rebound. Bowel sounds positive normoactive.    Planned proceedures:  colonoscopy with indicated procedures. I have discussed the risks benefits and complications of procedures to include not limited to bleeding, infection, perforation and the risk of sedation and the patient wishes to proceed.    Steve Sails, MD Gastroenterology 07/28/2017  2:47 PM

## 2017-07-28 NOTE — Anesthesia Procedure Notes (Signed)
Date/Time: 07/28/2017 3:00 PM Performed by: Allean Found, CRNA Pre-anesthesia Checklist: Patient identified, Emergency Drugs available, Suction available, Patient being monitored and Timeout performed Patient Re-evaluated:Patient Re-evaluated prior to induction Oxygen Delivery Method: Nasal cannula Placement Confirmation: positive ETCO2

## 2017-07-28 NOTE — Transfer of Care (Signed)
Immediate Anesthesia Transfer of Care Note  Patient: Steve Gregory  Procedure(s) Performed: COLONOSCOPY WITH PROPOFOL (N/A )  Patient Location: PACU  Anesthesia Type:MAC  Level of Consciousness: awake  Airway & Oxygen Therapy: Patient Spontanous Breathing and Patient connected to nasal cannula oxygen  Post-op Assessment: Report given to RN and Post -op Vital signs reviewed and stable  Post vital signs: Reviewed and stable  Last Vitals:  Vitals:   07/28/17 1306  BP: 137/83  Pulse: (!) 118  Resp: (!) 22  Temp: (!) 36.3 C  SpO2: 95%    Last Pain:  Vitals:   07/28/17 1306  TempSrc: Tympanic         Complications: No apparent anesthesia complications

## 2017-07-28 NOTE — Op Note (Signed)
[facility] Gastroenterology Patient Name: Steve Gregory Procedure Date: 07/28/2017 2:46 PM MRN: [id number] Account #: [id number] Date of Birth: 03-Mar-1956 Admit Type: Outpatient Age: 61 Room: [facility] ENDO ROOM 4 Gender: Male Note Status: Finalized Procedure:            Colonoscopy Indications:          Personal history of colonic polyps, Abnormal PET scan                        of the GI tract Providers:            [name], MD Referring MD:         Ocie Cornfield. Ouida Sills MD, MD (Referring MD) Medicines:            Monitored Anesthesia Care Complications:        No immediate complications. Procedure:            Pre-Anesthesia Assessment:                       - ASA Grade Assessment: III - A patient with severe                        systemic disease.                       After obtaining informed consent, the colonoscope was                        passed under direct vision. Throughout the procedure,                        the patient's blood pressure, pulse, and oxygen                        saturations were monitored continuously. The Olympus                        PCF-H180AL colonoscope ( S#: [id number] ) was introduced                        through the anus and advanced to the the cecum,                        identified by appendiceal orifice and ileocecal valve.                        The colonoscopy was performed without difficulty. The                        colonoscopy was performed with moderate difficulty. The                        patient tolerated the procedure well. The quality of                        the bowel preparation was good. Findings:      Four sessile polyps were found in the rectum. The polyps were 1 mm in       size. These polyps were removed with a cold biopsy forceps. Resection       and  retrieval were complete.      A 4 mm polyp was found in the distal sigmoid colon. The polyp was       sessile. The polyp was removed with a cold  snare. Resection and       retrieval were complete.      A 4 mm polyp was found in the mid sigmoid colon. The polyp was sessile.       The polyp was removed with a cold snare. Resection and retrieval were       complete.      Two sessile polyps were found in the distal descending colon. The polyps       were 2 to 3 mm in size. These polyps were removed with a cold biopsy       forceps. Resection and retrieval were complete.      A 4 mm polyp was found in the proximal descending colon. The polyp was       sessile. The polyp was removed with a cold snare. Resection and       retrieval were complete.      A 2 mm polyp was found in the cecum. The polyp was sessile. The polyp       was removed with a cold biopsy forceps. Resection and retrieval were       complete.      A 2 mm polyp was found in the transverse colon. The polyp was sessile.       The polyp was removed with a cold biopsy forceps. Resection and       retrieval were complete.      A 1 mm polyp was found in the sigmoid colon. The polyp was sessile. The       polyp was removed with a cold biopsy forceps. Resection and retrieval       were complete.      A few small-mouthed diverticula were found in the sigmoid colon and       distal descending colon.      The retroflexed view of the distal rectum and anal verge was normal and       showed no anal or rectal abnormalities. Impression:           - Four 1 mm polyps in the rectum, removed with a cold                        biopsy forceps. Resected and retrieved.                       - One 4 mm polyp in the distal sigmoid colon, removed                        with a cold snare. Resected and retrieved.                       - One 4 mm polyp in the mid sigmoid colon, removed with                        a cold snare. Resected and retrieved.                       - Two 2 to 3 mm polyps in the distal descending colon,  removed with a cold biopsy forceps. Resected and                         retrieved.                       - One 4 mm polyp in the proximal descending colon,                        removed with a cold snare. Resected and retrieved.                       - One 2 mm polyp in the cecum, removed with a cold                        biopsy forceps. Resected and retrieved.                       - One 2 mm polyp in the transverse colon, removed with                        a cold biopsy forceps. Resected and retrieved.                       - One 1 mm polyp in the sigmoid colon, removed with a                        cold biopsy forceps. Resected and retrieved.                       - Diverticulosis in the sigmoid colon and in the distal                        descending colon. Recommendation:       - Discharge patient to home.                       - Await pathology results.                       - Telephone GI clinic for pathology results in 1 week. Procedure Code(s):    --- Professional ---                       [phone], Colonoscopy, flexible; with removal of tumor(s),                        polyp(s), or other lesion(s) by snare technique                       45380, 64, Colonoscopy, flexible; with biopsy, single                        or multiple Diagnosis Code(s):    --- Professional ---                       K62.1, Rectal polyp                       D12.4, Benign neoplasm of descending colon  D12.0, Benign neoplasm of cecum                       D12.3, Benign neoplasm of transverse colon (hepatic                        flexure or splenic flexure)                       D12.5, Benign neoplasm of sigmoid colon                       Z86.010, Personal history of colonic polyps                       K57.30, Diverticulosis of large intestine without                        perforation or abscess without bleeding                       R93.3, Abnormal findings on diagnostic imaging of other                        parts of digestive  tract CPT copyright 2016 American [employer]. All rights reserved. The codes documented in this report are preliminary and upon coder review may  be revised to meet current compliance requirements. [name], MD 07/28/2017 3:43:32 PM This report has been signed electronically. Number of Addenda: 0 Note Initiated On: 07/28/2017 2:46 PM Scope Withdrawal Time: 0 hours 17 minutes 50 seconds  Total Procedure Duration: 0 hours 33 minutes 1 second       [facility]

## 2017-07-28 NOTE — Anesthesia Post-op Follow-up Note (Signed)
Anesthesia QCDR form completed.        

## 2017-07-28 NOTE — Anesthesia Postprocedure Evaluation (Signed)
Anesthesia Post Note  Patient: ZAYDENN BALAGUER  Procedure(s) Performed: COLONOSCOPY WITH PROPOFOL (N/A )  Patient location during evaluation: Endoscopy Anesthesia Type: General Level of consciousness: awake and alert Pain management: pain level controlled Vital Signs Assessment: post-procedure vital signs reviewed and stable Respiratory status: spontaneous breathing, nonlabored ventilation, respiratory function stable and patient connected to nasal cannula oxygen Cardiovascular status: blood pressure returned to baseline and stable Postop Assessment: no apparent nausea or vomiting Anesthetic complications: no     Last Vitals:  Vitals:   07/28/17 1306 07/28/17 1544  BP: 137/83 104/70  Pulse: (!) 118   Resp: (!) 22   Temp: (!) 36.3 C (!) 36 C  SpO2: 95%     Last Pain:  Vitals:   07/28/17 1544  TempSrc: Tympanic                 Martha Clan

## 2017-07-28 NOTE — Anesthesia Preprocedure Evaluation (Signed)
Anesthesia Evaluation  Patient identified by MRN, date of birth, ID band Patient awake    Reviewed: Allergy & Precautions, H&P , NPO status , Patient's Chart, lab work & pertinent test results  History of Anesthesia Complications Negative for: history of anesthetic complications  Airway Mallampati: III  TM Distance: >3 FB Neck ROM: full    Dental  (+) Chipped, Poor Dentition, Missing, Edentulous Upper, Upper Dentures   Pulmonary shortness of breath and with exertion, pneumonia, COPD, Current Smoker,           Cardiovascular hypertension, + Peripheral Vascular Disease and + Orthopnea  (-) Past MI      Neuro/Psych  Headaches, Seizures -, Well Controlled,  PSYCHIATRIC DISORDERS Depression    GI/Hepatic Neg liver ROS, hiatal hernia, GERD  Medicated and Controlled,  Endo/Other  negative endocrine ROS  Renal/GU negative Renal ROS  negative genitourinary   Musculoskeletal  (+) Arthritis ,   Abdominal   Peds  Hematology negative hematology ROS (+)   Anesthesia Other Findings Past Medical History: No date: Arthritis No date: Bronchitis, chronic (HCC) No date: COPD (chronic obstructive pulmonary disease) (HCC) No date: Cough     Comment:  CHRONIC No date: Depression No date: Dizziness 06/28/2017: Essential hypertension No date: GERD (gastroesophageal reflux disease) No date: H/O emphysema No date: Headache 06/05/2014: History of depression     Comment:  Last Assessment & Plan:  Mood is doing well on               medications.  No date: History of hiatal hernia No date: HOH (hard of hearing) No date: Hypertension No date: Lung mass No date: Orthopnea No date: Oxygen decrease     Comment:  H/O HOME USE, NOT NOW No date: Pneumonia     Comment:  in the past 06/05/2014: PVD (peripheral vascular disease) ([place])     Comment:  Overview:  Smoker with poor pulses  Last Assessment &               Plan:  No leg pain is noted  of late.  No date: Seizures (HCC)     Comment:  sounds like it could be vagal at times, coughing can               cause him to fade out and see white lights. had last sz 3              months ago when not taking meds No date: Shortness of breath dyspnea     Comment:  uses many different inhalers 06/28/2017: Varicose veins of both lower extremities with pain No date: Wheezing  Past Surgical History: 10/27/2015: CATARACT EXTRACTION W/PHACO; Right     Comment:  Procedure: CATARACT EXTRACTION PHACO AND INTRAOCULAR               LENS PLACEMENT ([place]) suture placed in right eye at end of              procedure;  Surgeon: Estill Cotta, MD;  Location:               [facility] ORS;  Service: Ophthalmology;  Laterality: Right;                [place] 01:02 AP% 24.4 CDE 28.12 fluid pack lot # [id number] 11/17/2015: CATARACT EXTRACTION W/PHACO; Left     Comment:  Procedure: CATARACT EXTRACTION PHACO AND INTRAOCULAR               LENS PLACEMENT (  [place]);  Surgeon: Estill Cotta, MD;                Location: [facility] ORS;  Service: Ophthalmology;  Laterality:              Left;  [place] 01:29 AP% 24.3 CDE 40.05 fluid pack lot #               [id number] 06/06/2017: ESOPHAGOGASTRODUODENOSCOPY (EGD) WITH PROPOFOL; N/A     Comment:  Procedure: ESOPHAGOGASTRODUODENOSCOPY (EGD) WITH               PROPOFOL;  Surgeon: [name], MD;  Location:               [facility] ENDOSCOPY;  Service: Endoscopy;  Laterality: N/A; No date: EYE SURGERY 1980: FACIAL RECONSTRUCTION SURGERY     Comment:  LEFT CHEEK BONE INJURY 1980  BMI    Body Mass Index:  24.16 kg/m      Reproductive/Obstetrics negative OB ROS                             Anesthesia Physical Anesthesia Plan  ASA: III  Anesthesia Plan: General   Post-op Pain Management:    Induction: Intravenous  PONV Risk Score and Plan: Propofol infusion  Airway Management Planned: Natural Airway and Nasal Cannula  Additional Equipment:    Intra-op Plan:   Post-operative Plan:   Informed Consent: I have reviewed the patients History and Physical, chart, labs and discussed the procedure including the risks, benefits and alternatives for the proposed anesthesia with the patient or authorized representative who has indicated his/her understanding and acceptance.   Dental Advisory Given  Plan Discussed with: Anesthesiologist, CRNA and Surgeon  Anesthesia Plan Comments: (Patient consented for risks of anesthesia including but not limited to:  - adverse reactions to medications - risk of intubation if required - damage to teeth, lips or other oral mucosa - sore throat or hoarseness - Damage to heart, brain, lungs or loss of life  Patient voiced understanding.)        Anesthesia Quick Evaluation

## 2017-08-01 ENCOUNTER — Institutional Professional Consult (permissible substitution): Payer: Medicare Other | Admitting: Radiation Oncology

## 2017-08-02 ENCOUNTER — Encounter: Payer: Self-pay | Admitting: Radiation Oncology

## 2017-08-02 ENCOUNTER — Other Ambulatory Visit: Payer: Self-pay | Admitting: *Deleted

## 2017-08-02 ENCOUNTER — Ambulatory Visit
Admission: RE | Admit: 2017-08-02 | Discharge: 2017-08-02 | Disposition: A | Discharge status: Home / Self Care | Payer: Medicare Other | Source: Ambulatory Visit | Attending: Radiation Oncology | Admitting: Radiation Oncology

## 2017-08-02 ENCOUNTER — Other Ambulatory Visit: Payer: Self-pay

## 2017-08-02 VITALS — BP 131/74 | HR 115 | Temp 100.2°F | Resp 20 | Wt 164.4 lb

## 2017-08-02 DIAGNOSIS — B3781 Candidal esophagitis: Secondary | ICD-10-CM | POA: Diagnosis not present

## 2017-08-02 DIAGNOSIS — Z510 Encounter for antineoplastic radiation therapy: Secondary | ICD-10-CM | POA: Diagnosis present

## 2017-08-02 DIAGNOSIS — Z8701 Personal history of pneumonia (recurrent): Secondary | ICD-10-CM | POA: Insufficient documentation

## 2017-08-02 DIAGNOSIS — R1310 Dysphagia, unspecified: Secondary | ICD-10-CM | POA: Diagnosis not present

## 2017-08-02 DIAGNOSIS — Z8669 Personal history of other diseases of the nervous system and sense organs: Secondary | ICD-10-CM | POA: Insufficient documentation

## 2017-08-02 DIAGNOSIS — Z79899 Other long term (current) drug therapy: Secondary | ICD-10-CM | POA: Diagnosis not present

## 2017-08-02 DIAGNOSIS — J449 Chronic obstructive pulmonary disease, unspecified: Secondary | ICD-10-CM | POA: Insufficient documentation

## 2017-08-02 DIAGNOSIS — I10 Essential (primary) hypertension: Secondary | ICD-10-CM | POA: Diagnosis not present

## 2017-08-02 DIAGNOSIS — I739 Peripheral vascular disease, unspecified: Secondary | ICD-10-CM | POA: Diagnosis not present

## 2017-08-02 DIAGNOSIS — R0602 Shortness of breath: Secondary | ICD-10-CM | POA: Diagnosis not present

## 2017-08-02 DIAGNOSIS — R918 Other nonspecific abnormal finding of lung field: Secondary | ICD-10-CM | POA: Diagnosis not present

## 2017-08-02 DIAGNOSIS — F17210 Nicotine dependence, cigarettes, uncomplicated: Secondary | ICD-10-CM | POA: Insufficient documentation

## 2017-08-02 DIAGNOSIS — F329 Major depressive disorder, single episode, unspecified: Secondary | ICD-10-CM | POA: Diagnosis not present

## 2017-08-02 DIAGNOSIS — M129 Arthropathy, unspecified: Secondary | ICD-10-CM | POA: Diagnosis not present

## 2017-08-02 DIAGNOSIS — K219 Gastro-esophageal reflux disease without esophagitis: Secondary | ICD-10-CM | POA: Insufficient documentation

## 2017-08-02 LAB — SURGICAL PATHOLOGY

## 2017-08-02 MED ORDER — FLUCONAZOLE 100 MG PO TABS: 100.0000 mg | ORAL_TABLET | Freq: Every day | ORAL | 0 refills | Status: DC

## 2017-08-02 NOTE — Consult Note (Signed)
NEW PATIENT EVALUATION  Name: Steve Gregory  MRN: [id number]  Date:   08/02/2017     DOB: Oct 26, 1955   This 61 y.o. male patient presents to the clinic for initial evaluation of right upper lobe probable non-small cell lung cancer.  REFERRING PHYSICIAN: Kirk Ruths, MD  CHIEF COMPLAINT:  Chief Complaint  Patient presents with  . Lung Cancer    Pt is here for initial consultation of lung mass    DIAGNOSIS: The encounter diagnosis was Mass of upper lobe of right lung.   PREVIOUS INVESTIGATIONS:  CT scans and PET/CT scan reviewed Clinical notes reviewed  HPI: Patient is a 61 year old male with history of chronic COPD and bronchitis. He had a CT scan performed in January 2018 showing a small right upper lobe pulmonary nodule. On repeat CT scan in August 2018 is had increased in size to 1 cm suspicious for bronchogenic carcinoma. PET CT scan 05/03/2017 showed hypermetabolic activity again concerning for primary bronchogenic carcinoma. No other evidence of disease outside the chest or mediastinal adenopathy was seen. He did have intense hypermetabolic activity in the region of the anus has undergone colonoscopy with multiple polyps identified pathology pending. He also did have an upper endoscopy showing candidiasis of the esophagus and esophageal mucosal changes consistent with short segment Barrett's esophagus. He also had gastritis. He states he's been having some difficulty swallowing is not really on any antifungal medication at this time. Patient has chronic COPD is currently on disability for his pulmonary functions recently had an FEV1 of 60-65% of predicted. He cannot walk a flight of stairs. He's been seen by thoracic oncology and options for resection were discussed with the patient. He is now referred to radiation oncology for that opinion. Patient does not want surgical intervention.  PLANNED TREATMENT REGIMEN: SB RT  PAST MEDICAL HISTORY:  has a past medical history of  Arthritis, Bronchitis, chronic ([place]), COPD (chronic obstructive pulmonary disease) ([place]), Cough, Depression, Dizziness, Essential hypertension (06/28/2017), GERD (gastroesophageal reflux disease), H/O emphysema, Headache, History of depression (06/05/2014), History of hiatal hernia, HOH (hard of hearing), Hypertension, Lung mass, Orthopnea, Oxygen decrease, Pneumonia, PVD (peripheral vascular disease) ([place]) (06/05/2014), Seizures ([place]), Shortness of breath dyspnea, Varicose veins of both lower extremities with pain (06/28/2017), and Wheezing.    PAST SURGICAL HISTORY:  Past Surgical History:  Procedure Laterality Date  . CATARACT EXTRACTION W/PHACO Right 10/27/2015   Procedure: CATARACT EXTRACTION PHACO AND INTRAOCULAR LENS PLACEMENT ([place]) suture placed in right eye at end of procedure;  Surgeon: Estill Cotta, MD;  Location: [facility] ORS;  Service: Ophthalmology;  Laterality: Right;  [place] 01:02 AP% 24.4 CDE 28.12 fluid pack lot # [id number]  . CATARACT EXTRACTION W/PHACO Left 11/17/2015   Procedure: CATARACT EXTRACTION PHACO AND INTRAOCULAR LENS PLACEMENT ([place]);  Surgeon: Estill Cotta, MD;  Location: [facility] ORS;  Service: Ophthalmology;  Laterality: Left;  [place] 01:29 AP% 24.3 CDE 40.05 fluid pack lot # [id number]  . COLONOSCOPY WITH PROPOFOL N/A 07/28/2017   Procedure: COLONOSCOPY WITH PROPOFOL;  Surgeon: [name], MD;  Location: [facility] ENDOSCOPY;  Service: Endoscopy;  Laterality: N/A;  . ESOPHAGOGASTRODUODENOSCOPY (EGD) WITH PROPOFOL N/A 06/06/2017   Procedure: ESOPHAGOGASTRODUODENOSCOPY (EGD) WITH PROPOFOL;  Surgeon: [name], MD;  Location: [facility];  Service: Endoscopy;  Laterality: N/A;  . EYE SURGERY    . FACIAL RECONSTRUCTION SURGERY  1980   LEFT CHEEK BONE INJURY 1980    FAMILY HISTORY: family history includes Aneurysm in his father; Heart disease in his brother; Hyperlipidemia  in his mother; Hypertension in his brother; Stroke in his father.  SOCIAL HISTORY:  reports  that he has been smoking.  He has a 43.00 pack-year smoking history. he has never used smokeless tobacco. He reports that he does not drink alcohol or use drugs.  ALLERGIES: Chantix [varenicline]; Lipitor [atorvastatin]; Mobic [meloxicam]; and Statins  MEDICATIONS:  Current Outpatient Medications  Medication Sig Dispense Refill  . albuterol (PROVENTIL) (2.5 MG/3ML) 0.083% nebulizer solution Take 2.5 mg by nebulization every 6 (six) hours as needed for wheezing or shortness of breath.    . Cholecalciferol (VITAMIN D) 2000 units tablet Take by mouth.    . clotrimazole (MYCELEX) 10 MG troche TAKE 1 TABLET BY MOUTH 5 TIMES DAILY FOR 10 DAYS. DISSOLVE THE TABLET SLOWLY AND COMPLETELY IN MOUTH  0  . fluticasone (FLONASE) 50 MCG/ACT nasal spray Place 2 sprays into both nostrils daily.    . Fluticasone-Salmeterol (ADVAIR) 250-50 MCG/DOSE AEPB Inhale 1 puff into the lungs 2 (two) times daily.    Marland Kitchen ipratropium (ATROVENT HFA) 17 MCG/ACT inhaler Inhale into the lungs 3 (three) times daily.    Marland Kitchen ipratropium-albuterol (DUONEB) 0.5-2.5 (3) MG/3ML SOLN Take 3 mLs by nebulization.    Marland Kitchen KLOR-CON 10 10 MEQ tablet TAKE 1 TABLET (10 MEQ TOTAL) BY MOUTH ONCE DAILY  0  . losartan (COZAAR) 25 MG tablet Take 25 mg by mouth daily.    . montelukast (SINGULAIR) 10 MG tablet Take 10 mg by mouth every morning.    . pantoprazole (PROTONIX) 40 MG tablet Take 40 mg by mouth 2 (two) times daily.     . predniSONE (DELTASONE) 10 MG tablet Take 15 mg by mouth daily with breakfast.    . sucralfate (CARAFATE) 1 g tablet Take by mouth.    . theophylline (UNIPHYL) 400 MG 24 hr tablet Take 400 mg by mouth daily.    Marland Kitchen aspirin EC 81 MG tablet Take by mouth.    . celecoxib (CELEBREX) 100 MG capsule TAKE 1 CAPSULE BY MOUTH 2 TIMES DAILY AS NEEDED FOR PAIN.    . fluconazole (DIFLUCAN) 100 MG tablet Take 1 tablet (100 mg total) by mouth daily. 10 tablet 0  . meloxicam (MOBIC) 7.5 MG tablet Take by mouth.     No current  facility-administered medications for this encounter.     ECOG PERFORMANCE STATUS:  0 - Asymptomatic  REVIEW OF SYSTEMS: Except for the poor oral intake slight nonproductive cough marked poor pulmonary functions  Patient denies any weight loss, fatigue, weakness, fever, chills or night sweats. Patient denies any loss of vision, blurred vision. Patient denies any ringing  of the ears or hearing loss. No irregular heartbeat. Patient denies heart murmur or history of fainting. Patient denies any chest pain or pain radiating to her upper extremities. Patient denies any shortness of breath, difficulty breathing at night, cough or hemoptysis. Patient denies any swelling in the lower legs. Patient denies any nausea vomiting, vomiting of blood, or coffee ground material in the vomitus. Patient denies any stomach pain. Patient states has had normal bowel movements no significant constipation or diarrhea. Patient denies any dysuria, hematuria or significant nocturia. Patient denies any problems walking, swelling in the joints or loss of balance. Patient denies any skin changes, loss of hair or loss of weight. Patient denies any excessive worrying or anxiety or significant depression. Patient denies any problems with insomnia. Patient denies excessive thirst, polyuria, polydipsia. Patient denies any swollen glands, patient denies easy bruising or easy bleeding. Patient denies  any recent infections, allergies or URI. Patient "s visual fields have not changed significantly in recent time.    PHYSICAL EXAM: BP 131/74   Pulse (!) 115   Temp 100.2 F (37.9 C)   Resp 20   Wt 164 lb 5.7 oz (74.6 kg)   BMI 23.92 kg/m  Well-developed well-nourished patient in NAD. HEENT reveals PERLA, EOMI, discs not visualized.  Oral cavity is clear. No oral mucosal lesions are identified. Neck is clear without evidence of cervical or supraclavicular adenopathy. Lungs are clear to A&P. Cardiac examination is essentially unremarkable  with regular rate and rhythm without murmur rub or thrill. Abdomen is benign with no organomegaly or masses noted. Motor sensory and DTR levels are equal and symmetric in the upper and lower extremities. Cranial nerves II through XII are grossly intact. Proprioception is intact. No peripheral adenopathy or edema is identified. No motor or sensory levels are noted. Crude visual fields are within normal range.  LABORATORY DATA: Pathology reports from his upper and lower endoscopy have been reviewed certainly colonic polyps pathology is still pending    RADIOLOGY RESULTS: PET CT and CT scans reviewed and compatible with the above-stated findings   IMPRESSION: Stage I right upper lobe non-small cell lung cancer in 61 year old male  PLAN: At this time I have discussed possible needle biopsy of this lesion which the patient has declined. Severely this would put him at high risk for pneumothorax and with his poor pulmonary functions may be significantly disabling. Based on multiple doctors reviewed his case we believe this is a non-small cell lung cancer and I would proceed with SB RT to 5000 cGy in 5 fractions. Risks and benefits of treatment including possible developing of cough fatigue skin reaction possible slight chance of radiation esophagitis all were discussed with the patient. I personally set up and ordered CT simulation would 4D study for next week. Patient seems to comprehend my treatment plan well.  I would like to take this opportunity to thank you for allowing me to participate in the care of your patient.Armstead Peaks., MD

## 2017-08-09 ENCOUNTER — Ambulatory Visit: Payer: Medicare Other

## 2017-08-10 ENCOUNTER — Encounter (INDEPENDENT_AMBULATORY_CARE_PROVIDER_SITE_OTHER): Payer: Medicare Other

## 2017-08-10 ENCOUNTER — Ambulatory Visit (INDEPENDENT_AMBULATORY_CARE_PROVIDER_SITE_OTHER): Payer: Medicare Other | Admitting: Vascular Surgery

## 2017-08-15 ENCOUNTER — Ambulatory Visit
Admission: RE | Admit: 2017-08-15 | Discharge: 2017-08-15 | Disposition: A | Discharge status: Home / Self Care | Payer: Medicare Other | Source: Ambulatory Visit | Attending: Radiation Oncology | Admitting: Radiation Oncology

## 2017-08-16 DIAGNOSIS — Z510 Encounter for antineoplastic radiation therapy: Secondary | ICD-10-CM | POA: Diagnosis not present

## 2017-08-22 ENCOUNTER — Ambulatory Visit: Payer: Medicare Other

## 2017-08-22 DIAGNOSIS — Z510 Encounter for antineoplastic radiation therapy: Secondary | ICD-10-CM | POA: Diagnosis not present

## 2017-08-24 ENCOUNTER — Ambulatory Visit
Admission: RE | Admit: 2017-08-24 | Discharge: 2017-08-24 | Disposition: A | Discharge status: Home / Self Care | Payer: Medicare Other | Source: Ambulatory Visit | Attending: Radiation Oncology | Admitting: Radiation Oncology

## 2017-08-24 DIAGNOSIS — Z510 Encounter for antineoplastic radiation therapy: Secondary | ICD-10-CM | POA: Diagnosis not present

## 2017-08-26 ENCOUNTER — Ambulatory Visit
Admission: RE | Admit: 2017-08-26 | Discharge: 2017-08-26 | Disposition: A | Discharge status: Home / Self Care | Payer: Medicare Other | Source: Ambulatory Visit | Attending: Radiation Oncology | Admitting: Radiation Oncology

## 2017-08-26 DIAGNOSIS — Z510 Encounter for antineoplastic radiation therapy: Secondary | ICD-10-CM | POA: Diagnosis not present

## 2017-08-29 ENCOUNTER — Ambulatory Visit
Admission: RE | Admit: 2017-08-29 | Discharge: 2017-08-29 | Disposition: A | Discharge status: Home / Self Care | Payer: Medicare Other | Source: Ambulatory Visit | Attending: Radiation Oncology | Admitting: Radiation Oncology

## 2017-08-29 DIAGNOSIS — Z510 Encounter for antineoplastic radiation therapy: Secondary | ICD-10-CM | POA: Diagnosis not present

## 2017-08-30 DIAGNOSIS — R918 Other nonspecific abnormal finding of lung field: Secondary | ICD-10-CM | POA: Diagnosis not present

## 2017-08-30 DIAGNOSIS — R0602 Shortness of breath: Secondary | ICD-10-CM | POA: Diagnosis not present

## 2017-08-30 DIAGNOSIS — Z510 Encounter for antineoplastic radiation therapy: Secondary | ICD-10-CM | POA: Diagnosis present

## 2017-08-30 DIAGNOSIS — Z8669 Personal history of other diseases of the nervous system and sense organs: Secondary | ICD-10-CM | POA: Diagnosis not present

## 2017-08-30 DIAGNOSIS — I739 Peripheral vascular disease, unspecified: Secondary | ICD-10-CM | POA: Diagnosis not present

## 2017-08-30 DIAGNOSIS — J449 Chronic obstructive pulmonary disease, unspecified: Secondary | ICD-10-CM | POA: Diagnosis not present

## 2017-08-30 DIAGNOSIS — F17210 Nicotine dependence, cigarettes, uncomplicated: Secondary | ICD-10-CM | POA: Diagnosis not present

## 2017-08-30 DIAGNOSIS — Z79899 Other long term (current) drug therapy: Secondary | ICD-10-CM | POA: Diagnosis not present

## 2017-08-30 DIAGNOSIS — B3781 Candidal esophagitis: Secondary | ICD-10-CM | POA: Diagnosis not present

## 2017-08-30 DIAGNOSIS — M129 Arthropathy, unspecified: Secondary | ICD-10-CM | POA: Diagnosis not present

## 2017-08-30 DIAGNOSIS — R1310 Dysphagia, unspecified: Secondary | ICD-10-CM | POA: Diagnosis not present

## 2017-08-30 DIAGNOSIS — K219 Gastro-esophageal reflux disease without esophagitis: Secondary | ICD-10-CM | POA: Diagnosis not present

## 2017-08-30 DIAGNOSIS — F329 Major depressive disorder, single episode, unspecified: Secondary | ICD-10-CM | POA: Diagnosis not present

## 2017-08-30 DIAGNOSIS — Z8701 Personal history of pneumonia (recurrent): Secondary | ICD-10-CM | POA: Diagnosis not present

## 2017-08-30 DIAGNOSIS — I10 Essential (primary) hypertension: Secondary | ICD-10-CM | POA: Diagnosis not present

## 2017-08-31 ENCOUNTER — Ambulatory Visit
Admission: RE | Admit: 2017-08-31 | Discharge: 2017-08-31 | Disposition: A | Discharge status: Home / Self Care | Payer: Medicare Other | Source: Ambulatory Visit | Attending: Radiation Oncology | Admitting: Radiation Oncology

## 2017-08-31 DIAGNOSIS — Z510 Encounter for antineoplastic radiation therapy: Secondary | ICD-10-CM | POA: Diagnosis not present

## 2017-09-05 ENCOUNTER — Ambulatory Visit
Admission: RE | Admit: 2017-09-05 | Discharge: 2017-09-05 | Disposition: A | Discharge status: Home / Self Care | Payer: Medicare Other | Source: Ambulatory Visit | Attending: Radiation Oncology | Admitting: Radiation Oncology

## 2017-09-05 DIAGNOSIS — Z510 Encounter for antineoplastic radiation therapy: Secondary | ICD-10-CM | POA: Diagnosis not present

## 2017-10-10 ENCOUNTER — Other Ambulatory Visit: Payer: Self-pay | Admitting: *Deleted

## 2017-10-10 ENCOUNTER — Other Ambulatory Visit: Payer: Self-pay

## 2017-10-10 ENCOUNTER — Ambulatory Visit
Admission: RE | Admit: 2017-10-10 | Discharge: 2017-10-10 | Disposition: A | Discharge status: Home / Self Care | Payer: Medicare Other | Source: Ambulatory Visit | Attending: Radiation Oncology | Admitting: Radiation Oncology

## 2017-10-10 ENCOUNTER — Encounter: Payer: Self-pay | Admitting: Radiation Oncology

## 2017-10-10 VITALS — BP 142/80 | HR 89 | Temp 97.6°F | Resp 18 | Wt 166.2 lb

## 2017-10-10 DIAGNOSIS — Z923 Personal history of irradiation: Secondary | ICD-10-CM | POA: Diagnosis not present

## 2017-10-10 DIAGNOSIS — R918 Other nonspecific abnormal finding of lung field: Secondary | ICD-10-CM | POA: Insufficient documentation

## 2017-10-10 DIAGNOSIS — C3411 Malignant neoplasm of upper lobe, right bronchus or lung: Secondary | ICD-10-CM | POA: Diagnosis not present

## 2017-10-10 NOTE — Progress Notes (Signed)
Radiation Oncology Follow up Note  Name: Steve Gregory   Date:   10/10/2017 MRN:  [id number] DOB: Jul 30, 1956    This 62 y.o. male presents to the clinic today for one-month follow-up status post SB RT for stage I right upper lobe non-small cell lung cancer.  REFERRING PROVIDER: Kirk Ruths, MD  HPI: Patient is a 62 year old male now out 1 month having completed SB RT to his right upper lobe for presumed stage I non-small cell lung cancer.Marland Kitchen He is seen today in routine follow-up is doing well. He specifically denies cough hemoptysis or chest tightness. He states his poor function has not worsened significantly since his treatment.  COMPLICATIONS OF TREATMENT: none  FOLLOW UP COMPLIANCE: keeps appointments   PHYSICAL EXAM:  BP (!) 142/80   Pulse 89   Temp 97.6 F (36.4 C)   Resp 18   Wt 166 lb 3.6 oz (75.4 kg)   BMI 24.20 kg/m  Well-developed well-nourished patient in NAD. HEENT reveals PERLA, EOMI, discs not visualized.  Oral cavity is clear. No oral mucosal lesions are identified. Neck is clear without evidence of cervical or supraclavicular adenopathy. Lungs are clear to A&P. Cardiac examination is essentially unremarkable with regular rate and rhythm without murmur rub or thrill. Abdomen is benign with no organomegaly or masses noted. Motor sensory and DTR levels are equal and symmetric in the upper and lower extremities. Cranial nerves II through XII are grossly intact. Proprioception is intact. No peripheral adenopathy or edema is identified. No motor or sensory levels are noted. Crude visual fields are within normal range.  RADIOLOGY RESULTS: No current films for review  PLAN: Present time he is doing well stable after SB RT to his right upper lobe. I'm please was overall progress. I've ordered a CT scan in 3 months will see him shortly thereafter for follow-up. Patient knows to call sooner with any concerns.  I would like to take this opportunity to thank you for allowing  me to participate in the care of your patient.Noreene Filbert, MD

## 2017-11-15 ENCOUNTER — Other Ambulatory Visit: Payer: Self-pay

## 2017-11-15 ENCOUNTER — Other Ambulatory Visit: Payer: Self-pay | Admitting: Student in an Organized Health Care Education/Training Program

## 2017-11-15 ENCOUNTER — Ambulatory Visit
Payer: Medicare Other | Attending: Student in an Organized Health Care Education/Training Program | Admitting: Student in an Organized Health Care Education/Training Program

## 2017-11-15 ENCOUNTER — Encounter: Payer: Self-pay | Admitting: Student in an Organized Health Care Education/Training Program

## 2017-11-15 VITALS — BP 152/68 | HR 94 | Temp 98.0°F | Resp 18 | Ht 69.0 in | Wt 164.0 lb

## 2017-11-15 DIAGNOSIS — M79605 Pain in left leg: Secondary | ICD-10-CM

## 2017-11-15 DIAGNOSIS — Z9889 Other specified postprocedural states: Secondary | ICD-10-CM | POA: Diagnosis not present

## 2017-11-15 DIAGNOSIS — G8929 Other chronic pain: Secondary | ICD-10-CM | POA: Diagnosis not present

## 2017-11-15 DIAGNOSIS — M5136 Other intervertebral disc degeneration, lumbar region: Secondary | ICD-10-CM

## 2017-11-15 DIAGNOSIS — F17200 Nicotine dependence, unspecified, uncomplicated: Secondary | ICD-10-CM | POA: Insufficient documentation

## 2017-11-15 DIAGNOSIS — G894 Chronic pain syndrome: Secondary | ICD-10-CM | POA: Diagnosis not present

## 2017-11-15 DIAGNOSIS — Z9842 Cataract extraction status, left eye: Secondary | ICD-10-CM | POA: Insufficient documentation

## 2017-11-15 DIAGNOSIS — R918 Other nonspecific abnormal finding of lung field: Secondary | ICD-10-CM

## 2017-11-15 DIAGNOSIS — I739 Peripheral vascular disease, unspecified: Secondary | ICD-10-CM | POA: Diagnosis not present

## 2017-11-15 DIAGNOSIS — M79604 Pain in right leg: Secondary | ICD-10-CM

## 2017-11-15 DIAGNOSIS — M51369 Other intervertebral disc degeneration, lumbar region without mention of lumbar back pain or lower extremity pain: Secondary | ICD-10-CM

## 2017-11-15 DIAGNOSIS — J432 Centrilobular emphysema: Secondary | ICD-10-CM

## 2017-11-15 DIAGNOSIS — Z888 Allergy status to other drugs, medicaments and biological substances status: Secondary | ICD-10-CM | POA: Insufficient documentation

## 2017-11-15 DIAGNOSIS — M5442 Lumbago with sciatica, left side: Secondary | ICD-10-CM

## 2017-11-15 DIAGNOSIS — Z7982 Long term (current) use of aspirin: Secondary | ICD-10-CM | POA: Insufficient documentation

## 2017-11-15 DIAGNOSIS — Z9841 Cataract extraction status, right eye: Secondary | ICD-10-CM | POA: Diagnosis not present

## 2017-11-15 DIAGNOSIS — Z79899 Other long term (current) drug therapy: Secondary | ICD-10-CM | POA: Insufficient documentation

## 2017-11-15 DIAGNOSIS — M5441 Lumbago with sciatica, right side: Secondary | ICD-10-CM | POA: Diagnosis not present

## 2017-11-15 DIAGNOSIS — M542 Cervicalgia: Secondary | ICD-10-CM | POA: Diagnosis not present

## 2017-11-15 MED ORDER — GABAPENTIN 300 MG PO CAPS: 300.0000 mg | ORAL_CAPSULE | Freq: Three times a day (TID) | ORAL | 0 refills | Status: DC

## 2017-11-15 MED ORDER — TIZANIDINE HCL 2 MG PO TABS: 2.0000 mg | ORAL_TABLET | Freq: Three times a day (TID) | ORAL | 1 refills | Status: AC | PRN

## 2017-11-15 NOTE — Progress Notes (Signed)
Patient's Name: Steve Gregory  MRN: [id number]  Referring Provider: Erby Pian, MD  DOB: [birth year]  PCP: Kirk Ruths, MD  DOS: 11/15/2017  Note by: Gillis Santa, MD  Service setting: Ambulatory outpatient  Specialty: Interventional Pain Management  Location: [facility] (AMB) Pain Management Facility  Visit type: Initial Patient Evaluation  Patient type: New Patient   Primary Reason(s) for Visit: Encounter for initial evaluation of one or more chronic problems (new to examiner) potentially causing chronic pain, and posing a threat to normal musculoskeletal function. (Level of risk: High) CC: Back Pain (mid) and Neck Pain (base)  HPI  Steve Gregory is a 62 y.o. year old, male patient, who comes today to see [place] for the first time for an initial evaluation of his chronic pain. He has Lower extremity pain, bilateral; Varicose veins of both lower extremities with pain; Essential hypertension; Tobacco dependence; COPD (chronic obstructive pulmonary disease) with emphysema ([place]); GERD (gastroesophageal reflux disease); Healthcare maintenance; History of depression; Hyperglycemia, unspecified; PVD (peripheral vascular disease) ([place]); and Lung mass on their problem list. Today he comes in for evaluation of his Back Pain (mid) and Neck Pain (base)  Pain Assessment: Location:   Back Radiating: hip bilaterally down back of legs to feet, left worse Onset: More than a month ago Duration: Chronic pain Quality: Aching, Sharp, Throbbing Severity:3  /10 (self-reported pain score)  Note: Reported level is compatible with observation.                         When using our objective Pain Scale, levels between 6 and 10/10 are said to belong in an emergency room, as it progressively worsens from a 6/10, described as severely limiting, requiring emergency care not usually available at an outpatient pain management facility. At a 6/10 level, communication becomes difficult and requires great effort. Assistance to  reach the emergency department may be required. Facial flushing and profuse sweating along with potentially dangerous increases in heart rate and blood pressure will be evident. Effect on ADL: prolonged walking, standing Timing: Constant Modifying factors: medication  Onset and Duration: Gradual Cause of pain: Arthritis Severity: Getting worse, NAS-11 at its worse: 10/10, NAS-11 at its best: 8/10, NAS-11 now: 8/10 and NAS-11 on the average: 8/10 Timing: Not influenced by the time of the day, During activity or exercise and After a period of immobility Aggravating Factors: Bending, Kneeling, Lifiting, Prolonged standing, Squatting, Stooping , Twisting, Walking, Walking uphill and Walking downhill Alleviating Factors: Medications Associated Problems: Day-time cramps, Night-time cramps, Depression, Fatigue, Numbness, Spasms, Tingling, Weakness and Pain that wakes patient up Quality of Pain: Aching, Annoying, Cramping, Dull, Nagging, Pulsating, Throbbing, Toothache-like and Uncomfortable Previous Examinations or Tests: Biopsy, CT scan, Endoscopy and MRI scan Previous Treatments: Narcotic medications and Steroid treatments by mouth  The patient comes into the clinics today for the first time for a chronic pain management evaluation.  62 year old male with a history of COPD, chronic cough, right lung mass status post radiation therapy to his right upper lobe for presumed stage I non-small cell lung cancer. Patient also has a history of peripheral vascular disease.  For his COPD and respiratory symptoms, the patient's pulmonologist is managing him with Atrovent, albuterol, singular, Advair, prednisone 10 mg daily.    He presents today with low back pain.  Patient's pain radiates to his hips bilaterally and down the back of his legs to his feet, left greater than right.  Patient also endorses neck pain that  is most pronounced with cervical extension.  Patient's neck pain does radiate into his left  proximal shoulder and arm.  He denies any upper extremity weakness.  In regards to his low back pain, patient says that it radiates along the back of his legs to his ankles.  This is been getting worse over the last couple of years.  His lumbar x-rays were unremarkable but he has endorsed worsening paresthesias in his lower extremities.  Today I took the time to provide the patient with information regarding my pain practice. The patient was informed that my practice is divided into two sections: an interventional pain management section, as well as a completely separate and distinct medication management section. I explained that I have procedure days for my interventional therapies, and evaluation days for follow-ups and medication management. Because of the amount of documentation required during both, they are kept separated. This means that there is the possibility that he may be scheduled for a procedure on one day, and medication management the next. I have also informed him that because of staffing and facility limitations, I no longer take patients for medication management only. To illustrate the reasons for this, I gave the patient the example of surgeons, and how inappropriate it would be to refer a patient to his/her care, just to write for the post-surgical antibiotics on a surgery done by a different surgeon.   Because interventional pain management is my board-certified specialty, the patient was informed that joining my practice means that they are open to any and all interventional therapies. I made it clear that this does not mean that they will be forced to have any procedures done. What this means is that I believe interventional therapies to be essential part of the diagnosis and proper management of chronic pain conditions. Therefore, patients not interested in these interventional alternatives will be better served under the care of a different practitioner.  The patient was also made  aware of my Comprehensive Pain Management Safety Guidelines where by joining my practice, they limit all of their nerve blocks and joint injections to those done by our practice, for as long as we are retained to manage their care.   Historic Controlled Substance Pharmacotherapy Review  PMP and historical list of controlled substances: Tramadol 50 mill grams, quantity 30, last fill 10/06/2017 MME/day: 10 mg/day Medications: The patient did not bring the medication(s) to the appointment, as requested in our "New Patient Package" Pharmacodynamics: Desired effects: Analgesia: The patient reports <50% benefit. Reported improvement in function: The patient reports medication allows him to accomplish basic ADLs. Clinically meaningful improvement in function (CMIF): Sustained CMIF goals met Perceived effectiveness: Described as relatively effective, allowing for increase in activities of daily living (ADL) Undesirable effects: Side-effects or Adverse reactions: None reported Historical Monitoring: The patient  reports that he does not use drugs. List of all UDS Test(s): No results found for: MDMA, COCAINSCRNUR, [place], [place], CANNABQUANT, [place], [place] List of other Serum/Urine Drug Screening Test(s):  No results found for: AMPHSCRSER, BARBSCRSER, BENZOSCRSER, COCAINSCRSER, COCAINSCRNUR, PCPSCRSER, PCPQUANT, THCSCRSER, THCU, CANNABQUANT, OPIATESCRSER, OXYSCRSER, PROPOXSCRSER, ETH Historical Background Evaluation: [place] PMP: Six (6) year initial data search conducted.             [place] Department of public safety, offender search: Editor, commissioning Information) Non-contributory Risk Assessment Profile: Aberrant behavior: None observed or detected today Risk factors for fatal opioid overdose: None identified today Fatal overdose hazard ratio (HR): Calculation deferred Non-fatal overdose hazard ratio (HR): Calculation deferred Risk of  opioid abuse or dependence: 0.7-3.0% with doses ? 36 MME/day and 6.1-26% with  doses ? 120 MME/day. Substance use disorder (SUD) risk level: Low Opioid risk tool (ORT) (Total Score): 0 Opioid Risk Tool - 11/15/17 1438      Personal History of Substance Abuse   Alcohol  Negative    Illegal Drugs  Negative    Rx Drugs  Negative      Psychological Disease   Psychological Disease  Negative    Depression  Negative      Total Score   Opioid Risk Tool Scoring  0    Opioid Risk Interpretation  Low Risk      ORT Scoring interpretation table:  Score <3 = Low Risk for SUD  Score between 4-7 = Moderate Risk for SUD  Score >8 = High Risk for Opioid Abuse   PHQ-2 Depression Scale:  Total score: 0  PHQ-2 Scoring interpretation table: (Score and probability of major depressive disorder)  Score 0 = No depression  Score 1 = 15.4% Probability  Score 2 = 21.1% Probability  Score 3 = 38.4% Probability  Score 4 = 45.5% Probability  Score 5 = 56.4% Probability  Score 6 = 78.6% Probability   PHQ-9 Depression Scale:  Total score: 0  PHQ-9 Scoring interpretation table:  Score 0-4 = No depression  Score 5-9 = Mild depression  Score 10-14 = Moderate depression  Score 15-19 = Moderately severe depression  Score 20-27 = Severe depression (2.4 times higher risk of SUD and 2.89 times higher risk of overuse)   Pharmacologic Plan: As per protocol, I have not taken over any controlled substance management, pending the results of ordered tests and/or consults.            Initial impression: Pending review of available data and ordered tests.  Meds   Current Outpatient Medications:  .  albuterol (PROVENTIL) (2.5 MG/3ML) 0.083% nebulizer solution, Take 2.5 mg by nebulization every 6 (six) hours as needed for wheezing or shortness of breath., Disp: , Rfl:  .  Cholecalciferol (VITAMIN D) 2000 units tablet, Take by mouth., Disp: , Rfl:  .  fluticasone (FLONASE) 50 MCG/ACT nasal spray, Place 2 sprays into both nostrils daily., Disp: , Rfl:  .  Fluticasone-Salmeterol (ADVAIR) 250-50  MCG/DOSE AEPB, Inhale 1 puff into the lungs 2 (two) times daily., Disp: , Rfl:  .  ipratropium (ATROVENT HFA) 17 MCG/ACT inhaler, Inhale into the lungs 3 (three) times daily., Disp: , Rfl:  .  ipratropium-albuterol (DUONEB) 0.5-2.5 (3) MG/3ML SOLN, Take 3 mLs by nebulization., Disp: , Rfl:  .  KLOR-CON 10 10 MEQ tablet, TAKE 1 TABLET (10 MEQ TOTAL) BY MOUTH ONCE DAILY, Disp: , Rfl: 0 .  losartan (COZAAR) 25 MG tablet, Take 25 mg by mouth daily., Disp: , Rfl:  .  montelukast (SINGULAIR) 10 MG tablet, Take 10 mg by mouth every morning., Disp: , Rfl:  .  pantoprazole (PROTONIX) 40 MG tablet, Take 40 mg by mouth 2 (two) times daily. , Disp: , Rfl:  .  predniSONE (DELTASONE) 10 MG tablet, Take 15 mg by mouth daily with breakfast., Disp: , Rfl:  .  theophylline (UNIPHYL) 400 MG 24 hr tablet, Take 400 mg by mouth daily., Disp: , Rfl:  .  aspirin EC 81 MG tablet, Take by mouth., Disp: , Rfl:  .  celecoxib (CELEBREX) 100 MG capsule, TAKE 1 CAPSULE BY MOUTH 2 TIMES DAILY AS NEEDED FOR PAIN., Disp: , Rfl:  .  clotrimazole (MYCELEX) 10 MG  troche, TAKE 1 TABLET BY MOUTH 5 TIMES DAILY FOR 10 DAYS. DISSOLVE THE TABLET SLOWLY AND COMPLETELY IN MOUTH, Disp: , Rfl: 0 .  fluconazole (DIFLUCAN) 100 MG tablet, Take 1 tablet (100 mg total) by mouth daily. (Patient not taking: Reported on 11/15/2017), Disp: 10 tablet, Rfl: 0 .  gabapentin (NEURONTIN) 300 MG capsule, Take 1 capsule (300 mg total) by mouth every 8 (eight) hours., Disp: 90 capsule, Rfl: 0 .  meloxicam (MOBIC) 7.5 MG tablet, Take by mouth., Disp: , Rfl:  .  sucralfate (CARAFATE) 1 g tablet, Take by mouth., Disp: , Rfl:  .  tiZANidine (ZANAFLEX) 2 MG tablet, Take 1 tablet (2 mg total) by mouth every 8 (eight) hours as needed for muscle spasms., Disp: 90 tablet, Rfl: 1  Imaging Review  Lumbar DG (Complete) 4+V:  Results for orders placed during the hospital encounter of 04/03/17  DG Lumbar Spine Complete   Narrative CLINICAL DATA:  Low back pain for several  weeks.  EXAM: LUMBAR SPINE - COMPLETE 4+ VIEW  COMPARISON:  None.  FINDINGS: There is no evidence of lumbar spine fracture. Alignment is normal. Mild degenerative disc disease at L1-2. Generalized osteopenia. No other bone lesions identified. Aortic atherosclerosis.  IMPRESSION: No acute findings.   Electronically Signed   By: Earle Gell M.D.   On: 04/03/2017 15:34    ROS  Cardiovascular: High blood pressure and Chest pain Pulmonary or Respiratory: Lung problems, Difficulty blowing air out (Emphysema), Shortness of breath and Coughing up mucus (Bronchitis) Neurological: Seizure disorder Review of Past Neurological Studies: No results found for this or any previous visit. Psychological-Psychiatric: Anxiousness, Depressed and Prone to panicking Gastrointestinal: Heartburn due to stomach pushing into lungs (Hiatal hernia) and Reflux or heatburn Genitourinary: No reported renal or genitourinary signs or symptoms such as difficulty voiding or producing urine, peeing blood, non-functioning kidney, kidney stones, difficulty emptying the bladder, difficulty controlling the flow of urine, or chronic kidney disease Hematological: Brusing easily and Bleeding easily Endocrine: No reported endocrine signs or symptoms such as high or low blood sugar, rapid heart rate due to high thyroid levels, obesity or weight gain due to slow thyroid or thyroid disease Rheumatologic: Joint aches and or swelling due to excess weight (Osteoarthritis) Musculoskeletal: Negative for myasthenia gravis, muscular dystrophy, multiple sclerosis or malignant hyperthermia Work History: Disabled  Allergies  Mr. Villacis is allergic to chantix [varenicline]; lipitor [atorvastatin]; mobic [meloxicam]; and statins.  Laboratory Chemistry  Inflammation Markers (CRP: Acute Phase) (ESR: Chronic Phase) No results found for: CRP, ESRSEDRATE, LATICACIDVEN                       Rheumatology Markers No results found for: RF,  ANA, Therisa [place], [facility]              Renal Function Markers Lab Results  Component Value Date   BUN 12 04/03/2017   CREATININE 0.89 04/03/2017   GFRAA >60 04/03/2017   GFRNONAA >60 04/03/2017                 Hepatic Function Markers Lab Results  Component Value Date   AST 15 04/03/2017   ALT 8 (L) 04/03/2017   ALBUMIN 3.8 04/03/2017   ALKPHOS 77 04/03/2017   LIPASE 232 01/31/2013                 Electrolytes Lab Results  Component Value Date   NA 140 04/03/2017   K 4.0 04/03/2017   CL 106 04/03/2017  CALCIUM 9.3 04/03/2017   MG 3.3 (H) 02/01/2014                        Neuropathy Markers No results found for: VITAMINB12, FOLATE, HGBA1C, HIV               Bone Pathology Markers No results found for: VD25OH, [id number], [id number], [id number], 25OHVITD1, 25OHVITD2, 25OHVITD3, TESTOFREE, TESTOSTERONE                       Coagulation Parameters Lab Results  Component Value Date   INR 1.2 01/31/2014   LABPROT 14.9 (H) 01/31/2014   PLT 261 04/03/2017                 Cardiovascular Markers Lab Results  Component Value Date   BNP 6,953 (H) 01/31/2014   TROPONINI 0.04 01/31/2014   HGB 14.7 04/03/2017   HCT 44.1 04/03/2017                 CA Markers No results found for: CEA, CA125, LABCA2               Note: Lab results reviewed.  PFSH  Drug: Mr. Merryfield  reports that he does not use drugs. Alcohol:  reports that he does not drink alcohol. Tobacco:  reports that he has been smoking.  He has a 21.50 pack-year smoking history. he has never used smokeless tobacco. Medical:  has a past medical history of Allergy, Anxiety, Arthritis, Bronchitis, chronic ([place]), COPD (chronic obstructive pulmonary disease) ([place]), Cough, Depression, Dizziness, Essential hypertension (06/28/2017), GERD (gastroesophageal reflux disease), H/O emphysema, Headache, History of depression (06/05/2014), History of hiatal hernia, HOH (hard of hearing), Hypertension, Lung  mass, Orthopnea, Oxygen decrease, Pneumonia, PVD (peripheral vascular disease) ([place]) (06/05/2014), Seizures ([place]), Shortness of breath dyspnea, Varicose veins of both lower extremities with pain (06/28/2017), and Wheezing. Family: family history includes Aneurysm in his father; Heart disease in his brother; Hyperlipidemia in his mother; Hypertension in his brother; Stroke in his father.  Past Surgical History:  Procedure Laterality Date  . CATARACT EXTRACTION W/PHACO Right 10/27/2015   Procedure: CATARACT EXTRACTION PHACO AND INTRAOCULAR LENS PLACEMENT ([place]) suture placed in right eye at end of procedure;  Surgeon: Estill Cotta, MD;  Location: [facility] ORS;  Service: Ophthalmology;  Laterality: Right;  [place] 01:02 AP% 24.4 CDE 28.12 fluid pack lot # [id number]  . CATARACT EXTRACTION W/PHACO Left 11/17/2015   Procedure: CATARACT EXTRACTION PHACO AND INTRAOCULAR LENS PLACEMENT ([place]);  Surgeon: Estill Cotta, MD;  Location: [facility] ORS;  Service: Ophthalmology;  Laterality: Left;  [place] 01:29 AP% 24.3 CDE 40.05 fluid pack lot # [id number]  . COLONOSCOPY WITH PROPOFOL N/A 07/28/2017   Procedure: COLONOSCOPY WITH PROPOFOL;  Surgeon: [name], MD;  Location: [facility] ENDOSCOPY;  Service: Endoscopy;  Laterality: N/A;  . ESOPHAGOGASTRODUODENOSCOPY (EGD) WITH PROPOFOL N/A 06/06/2017   Procedure: ESOPHAGOGASTRODUODENOSCOPY (EGD) WITH PROPOFOL;  Surgeon: [name], MD;  Location: [facility] ENDOSCOPY;  Service: Endoscopy;  Laterality: N/A;  . EYE SURGERY    . FACIAL RECONSTRUCTION SURGERY  1980   LEFT CHEEK BONE INJURY 1980   Active Ambulatory Problems    Diagnosis Date Noted  . Lower extremity pain, bilateral 06/28/2017  . Varicose veins of both lower extremities with pain 06/28/2017  . Essential hypertension 06/28/2017  . Tobacco dependence 06/28/2017  . COPD (chronic obstructive pulmonary disease) with emphysema ([place]) 02/21/2014  . GERD (gastroesophageal reflux disease) 02/22/2014  . Healthcare  maintenance 11/04/2016  . History of depression 06/05/2014  . Hyperglycemia, unspecified 03/07/2017  . PVD (peripheral vascular disease) ([place]) 06/05/2014  . Lung mass 07/11/2017   Resolved Ambulatory Problems    Diagnosis Date Noted  . No Resolved Ambulatory Problems   Past Medical History:  Diagnosis Date  . Allergy   . Anxiety   . Arthritis   . Bronchitis, chronic ([place])   . COPD (chronic obstructive pulmonary disease) ([place])   . Cough   . Depression   . Dizziness   . Essential hypertension 06/28/2017  . GERD (gastroesophageal reflux disease)   . H/O emphysema   . Headache   . History of depression 06/05/2014  . History of hiatal hernia   . HOH (hard of hearing)   . Hypertension   . Lung mass   . Orthopnea   . Oxygen decrease   . Pneumonia   . PVD (peripheral vascular disease) ([place]) 06/05/2014  . Seizures ([place])   . Shortness of breath dyspnea   . Varicose veins of both lower extremities with pain 06/28/2017  . Wheezing    Constitutional Exam  General appearance: Well nourished, well developed, and well hydrated. In no apparent acute distress Vitals:   11/15/17 1427  BP: (!) 152/68  Pulse: 94  Resp: 18  Temp: 98 F (36.7 C)  SpO2: 96%  Weight: 164 lb (74.4 kg)  Height: _0  (1.753 m)   BMI Assessment: Estimated body mass index is 24.22 kg/m as calculated from the following:   Height as of this encounter: _1  (1.753 m).   Weight as of this encounter: 164 lb (74.4 kg).  BMI interpretation table: BMI level Category Range association with higher incidence of chronic pain  <18 kg/m2 Underweight   18.5-24.9 kg/m2 Ideal body weight   25-29.9 kg/m2 Overweight Increased incidence by 20%  30-34.9 kg/m2 Obese (Class I) Increased incidence by 68%  35-39.9 kg/m2 Severe obesity (Class II) Increased incidence by 136%  >40 kg/m2 Extreme obesity (Class III) Increased incidence by 254%   BMI Readings from Last 4 Encounters:  11/15/17 24.22 kg/m  10/10/17 24.20 kg/m   08/02/17 23.92 kg/m  07/28/17 24.16 kg/m   Wt Readings from Last 4 Encounters:  11/15/17 164 lb (74.4 kg)  10/10/17 166 lb 3.6 oz (75.4 kg)  08/02/17 164 lb 5.7 oz (74.6 kg)  07/28/17 166 lb (75.3 kg)  Psych/Mental status: Alert, oriented x 3 (person, place, & time)       Eyes: PERLA Respiratory: No evidence of acute respiratory distress  Cervical Spine Area Exam  Skin & Axial Inspection: No masses, redness, edema, swelling, or associated skin lesions Alignment: Symmetrical Functional ROM: Decreased ROM      Stability: No instability detected Muscle Tone/Strength: Functionally intact. No obvious neuro-muscular anomalies detected. Sensory (Neurological): Articular pain pattern Palpation: Complains of area being tender to palpation Positive provocative maneuver for for bilateral occipital neuralgia  Upper Extremity (UE) Exam    Side: Right upper extremity  Side: Left upper extremity  Skin & Extremity Inspection: Skin color, temperature, and hair growth are WNL. No peripheral edema or cyanosis. No masses, redness, swelling, asymmetry, or associated skin lesions. No contractures.  Skin & Extremity Inspection: Skin color, temperature, and hair growth are WNL. No peripheral edema or cyanosis. No masses, redness, swelling, asymmetry, or associated skin lesions. No contractures.  Functional ROM: Unrestricted ROM          Functional ROM: Unrestricted ROM  Muscle Tone/Strength: Functionally intact. No obvious neuro-muscular anomalies detected.  Muscle Tone/Strength: Functionally intact. No obvious neuro-muscular anomalies detected.  Sensory (Neurological): Unimpaired          Sensory (Neurological): Unimpaired          Palpation: No palpable anomalies              Palpation: No palpable anomalies              Specialized Test(s): Deferred         Specialized Test(s): Deferred          Thoracic Spine Area Exam  Skin & Axial Inspection: No masses, redness, or swelling Alignment:  Symmetrical Functional ROM: Unrestricted ROM Stability: No instability detected Muscle Tone/Strength: Functionally intact. No obvious neuro-muscular anomalies detected. Sensory (Neurological): Unimpaired Muscle strength & Tone: No palpable anomalies  Lumbar Spine Area Exam  Skin & Axial Inspection: No masses, redness, or swelling Alignment: Symmetrical Functional ROM: Unrestricted ROM      Stability: No instability detected Muscle Tone/Strength: Functionally intact. No obvious neuro-muscular anomalies detected. Sensory (Neurological): Dermatomal pain pattern and arthropathic Palpation: Complains of area being tender to palpation Bilateral Fist Percussion Test Provocative Tests: Lumbar Hyperextension and rotation test: Positive bilaterally for facet joint pain. Lumbar Lateral bending test: Positive ipsilateral radicular pain, bilaterally. Positive for bilateral foraminal stenosis. Patrick's Maneuver: Positive for bilateral S-I arthralgia              Gait & Posture Assessment  Ambulation: Unassisted Gait: Relatively normal for age and body habitus Posture: WNL   Lower Extremity Exam    Side: Right lower extremity  Side: Left lower extremity  Skin & Extremity Inspection: Skin color, temperature, and hair growth are WNL. No peripheral edema or cyanosis. No masses, redness, swelling, asymmetry, or associated skin lesions. No contractures.  Skin & Extremity Inspection: Skin color, temperature, and hair growth are WNL. No peripheral edema or cyanosis. No masses, redness, swelling, asymmetry, or associated skin lesions. No contractures.  Functional ROM: Unrestricted ROM          Functional ROM: Unrestricted ROM          Muscle Tone/Strength: Functionally intact. No obvious neuro-muscular anomalies detected.  Muscle Tone/Strength: Functionally intact. No obvious neuro-muscular anomalies detected.  Sensory (Neurological): Unimpaired  Sensory (Neurological): Unimpaired  Palpation: No palpable  anomalies  Palpation: No palpable anomalies   Assessment  Primary Diagnosis & Pertinent Problem List: The primary encounter diagnosis was Chronic bilateral low back pain with bilateral sciatica. Diagnoses of Lumbar degenerative disc disease, Cervicalgia, Lower extremity pain, bilateral, Centrilobular emphysema ([place]), PVD (peripheral vascular disease) ([place]), Lung mass, and Chronic pain syndrome were also pertinent to this visit.  Visit Diagnosis (New problems to examiner): 1. Chronic bilateral low back pain with bilateral sciatica   2. Lumbar degenerative disc disease   3. Cervicalgia   4. Lower extremity pain, bilateral   5. Centrilobular emphysema ([place])   6. PVD (peripheral vascular disease) ([facility])   7. Lung mass   8. Chronic pain syndrome   General Recommendations: The pain condition that the patient suffers from is best treated with a multidisciplinary approach that involves an increase in physical activity to prevent de-conditioning and worsening of the pain cycle, as well as psychological counseling (formal and/or informal) to address the co-morbid psychological affects of pain. Treatment will often involve judicious use of pain medications and interventional procedures to decrease the pain, allowing the patient to participate in the physical activity that will ultimately produce  long-lasting pain reductions. The goal of the multidisciplinary approach is to return the patient to a higher level of overall function and to restore their ability to perform activities of daily living.  62 year old male with a history of a lung mass concerning for stage I lung cancer status post radiation therapy which has helped with his wheezing symptoms but he continues to have persistent cough.  He presents today with a chief complaint of axial low back pain that radiates into bilateral legs in a dermatomal fashion as well as chronic neck pain, cervicalgia that radiates predominantly to his left shoulder and  proximal arm.  Patient was previously on opioid therapy which includes tramadol 50 mg twice daily, last fill 10/06/2017.  He has tried various anti-inflammatory medications in the form of NSAIDs including Celebrex, Mobic, ibuprofen which were not effective and resulted in GI discomfort.  Today we will complete a urine drug screen which should be negative for any opioid medications.  I will also send the patient for pain psychology evaluation regarding risk of substance abuse disorder.  I will have the patient start gabapentin as below.  Patient instructed to start 300 mg nightly for 2 weeks and increase to 300 mg twice daily for 2 weeks then 300 mg 3 times daily thereafter if not having any side effects.  I also recommended the patient try tizanidine 2 mg 3 times daily as needed for his lower lumbar muscle spasms.  Regarding further evaluation of his axial low back pain that radiates into bilateral legs which could be concerning for lumbar radiculopathy, recommend obtaining a lumbar MRI without contrast.  We will also obtain cervical spine x-ray to further evaluate his cervicalgia, neck pain, left shoulder pain.  Plan: -UDS today.  Should be negative for any controlled or illicit substances. -Referral to pain psychology for SUD eval -Gabapentin as below -Tizanidine as needed as below -Lumbar MRI to evaluate for lumbar neuroforaminal stenosis that could be contributing to his dermatomal pain pattern. -Cervical x-ray to evaluate for  cervical degenerative disc disease  Note: Please be advised that as per protocol, today's visit has been an evaluation only. We have not taken over the patient's controlled substance management.  Problem-specific plan: No problem-specific Assessment & Plan notes found for this encounter.  Ordered Lab-work, Procedure(s), Referral(s), & Consult(s): Orders Placed This Encounter  Procedures  . MR LUMBAR SPINE WO CONTRAST  . DG Cervical Spine With Flex & Extend  .  Compliance Drug Analysis, Ur  . Ambulatory referral to Psychology   Pharmacotherapy (current): Medications ordered:  Meds ordered this encounter  Medications  . gabapentin (NEURONTIN) 300 MG capsule    Sig: Take 1 capsule (300 mg total) by mouth every 8 (eight) hours.    Dispense:  90 capsule    Refill:  0    Do not place this medication, or any other prescription from our practice, on "Automatic Refill". Patient may have prescription filled one day early if pharmacy is closed on scheduled refill date.  Marland Kitchen tiZANidine (ZANAFLEX) 2 MG tablet    Sig: Take 1 tablet (2 mg total) by mouth every 8 (eight) hours as needed for muscle spasms.    Dispense:  90 tablet    Refill:  1    Do not place medication on "Automatic Refill". Fill one day early if pharmacy is closed on scheduled refill date.   Medications administered during this visit: Jhaden L. Pudlo had no medications administered during this visit.   Pharmacological management options:  Opioid Analgesics: The patient was informed that there is no guarantee that he would be a candidate for opioid analgesics. The decision will be made following CDC guidelines. This decision will be based on the results of diagnostic studies, as well as Mr. Schueler risk profile.   Membrane stabilizer: To be determined at a later time trial of gabapentin.  Can consider Lyrica, amitriptyline, Cymbalta in future  Muscle relaxant: To be determined at a later time trial of tizanidine.  Can consider baclofen, Flexeril, Robaxin and future  NSAID: To be determined at a later time has tried Celebrex, Mobic, ibuprofen, naproxen which were not effective  Other analgesic(s): To be determined at a later time   Interventional management options: Mr. Heikes was informed that there is no guarantee that he would be a candidate for interventional therapies. The decision will be based on the results of diagnostic studies, as well as Mr. Stiggers risk profile.  Procedure(s) under  consideration:  Lumbar epidural steroid injection -Lumbar facet medial branch nerve blocks -Lumbar radio frequency ablation -Cervical facet medial branch nerve blocks   Provider-requested follow-up: Return in about 4 weeks (around 12/13/2017) for After Psychological evaluation, After Imaging.  Future Appointments  Date Time Provider [place]  11/23/2017  2:00 PM [facility]  12/22/2017  1:45 PM Gillis Santa, MD [facility] None  02/13/2018  2:00 PM [facility]  02/22/2018  1:30 PM Noreene Filbert, MD [facility] None    Primary Care Physician: Kirk Ruths, MD Location: [facility] Note by: Gillis Santa, M.D, Date: 11/15/2017; Time: 3:26 PM  There are no Patient Instructions on file for this visit.

## 2017-11-15 NOTE — Progress Notes (Signed)
Safety precautions to be maintained throughout the outpatient stay will include: orient to surroundings, keep bed in low position, maintain call bell within reach at all times, provide assistance with transfer out of bed and ambulation.  

## 2017-11-21 LAB — COMPLIANCE DRUG ANALYSIS, UR

## 2017-11-23 ENCOUNTER — Ambulatory Visit
Admission: RE | Admit: 2017-11-23 | Discharge: 2017-11-23 | Disposition: A | Discharge status: Home / Self Care | Payer: Medicare Other | Source: Ambulatory Visit | Attending: Student in an Organized Health Care Education/Training Program | Admitting: Student in an Organized Health Care Education/Training Program

## 2017-11-23 DIAGNOSIS — G8929 Other chronic pain: Secondary | ICD-10-CM | POA: Diagnosis not present

## 2017-11-23 DIAGNOSIS — M5127 Other intervertebral disc displacement, lumbosacral region: Secondary | ICD-10-CM | POA: Diagnosis not present

## 2017-11-23 DIAGNOSIS — M5441 Lumbago with sciatica, right side: Secondary | ICD-10-CM | POA: Diagnosis not present

## 2017-11-23 DIAGNOSIS — M5136 Other intervertebral disc degeneration, lumbar region: Secondary | ICD-10-CM | POA: Diagnosis not present

## 2017-11-23 DIAGNOSIS — M5442 Lumbago with sciatica, left side: Secondary | ICD-10-CM | POA: Insufficient documentation

## 2017-12-22 ENCOUNTER — Ambulatory Visit: Payer: Medicare Other | Admitting: Student in an Organized Health Care Education/Training Program

## 2018-01-03 ENCOUNTER — Other Ambulatory Visit: Payer: Self-pay

## 2018-01-03 ENCOUNTER — Encounter: Payer: Self-pay | Admitting: Student in an Organized Health Care Education/Training Program

## 2018-01-03 ENCOUNTER — Ambulatory Visit
Payer: Medicare Other | Attending: Student in an Organized Health Care Education/Training Program | Admitting: Student in an Organized Health Care Education/Training Program

## 2018-01-03 VITALS — BP 153/87 | HR 76 | Temp 98.0°F | Resp 16 | Ht 69.0 in | Wt 164.0 lb

## 2018-01-03 DIAGNOSIS — M5442 Lumbago with sciatica, left side: Secondary | ICD-10-CM | POA: Diagnosis not present

## 2018-01-03 DIAGNOSIS — M25562 Pain in left knee: Secondary | ICD-10-CM | POA: Diagnosis not present

## 2018-01-03 DIAGNOSIS — G894 Chronic pain syndrome: Secondary | ICD-10-CM

## 2018-01-03 DIAGNOSIS — I10 Essential (primary) hypertension: Secondary | ICD-10-CM | POA: Insufficient documentation

## 2018-01-03 DIAGNOSIS — M79604 Pain in right leg: Secondary | ICD-10-CM | POA: Diagnosis not present

## 2018-01-03 DIAGNOSIS — F329 Major depressive disorder, single episode, unspecified: Secondary | ICD-10-CM | POA: Diagnosis not present

## 2018-01-03 DIAGNOSIS — G8929 Other chronic pain: Secondary | ICD-10-CM

## 2018-01-03 DIAGNOSIS — M5417 Radiculopathy, lumbosacral region: Secondary | ICD-10-CM | POA: Insufficient documentation

## 2018-01-03 DIAGNOSIS — M79605 Pain in left leg: Secondary | ICD-10-CM | POA: Diagnosis not present

## 2018-01-03 DIAGNOSIS — I83813 Varicose veins of bilateral lower extremities with pain: Secondary | ICD-10-CM | POA: Insufficient documentation

## 2018-01-03 DIAGNOSIS — M5136 Other intervertebral disc degeneration, lumbar region: Secondary | ICD-10-CM | POA: Diagnosis not present

## 2018-01-03 DIAGNOSIS — Z79899 Other long term (current) drug therapy: Secondary | ICD-10-CM | POA: Diagnosis not present

## 2018-01-03 DIAGNOSIS — K219 Gastro-esophageal reflux disease without esophagitis: Secondary | ICD-10-CM | POA: Diagnosis not present

## 2018-01-03 DIAGNOSIS — I739 Peripheral vascular disease, unspecified: Secondary | ICD-10-CM | POA: Insufficient documentation

## 2018-01-03 DIAGNOSIS — F419 Anxiety disorder, unspecified: Secondary | ICD-10-CM | POA: Diagnosis not present

## 2018-01-03 DIAGNOSIS — M5441 Lumbago with sciatica, right side: Secondary | ICD-10-CM | POA: Diagnosis not present

## 2018-01-03 DIAGNOSIS — Z7982 Long term (current) use of aspirin: Secondary | ICD-10-CM | POA: Diagnosis not present

## 2018-01-03 DIAGNOSIS — R739 Hyperglycemia, unspecified: Secondary | ICD-10-CM | POA: Diagnosis not present

## 2018-01-03 DIAGNOSIS — F17210 Nicotine dependence, cigarettes, uncomplicated: Secondary | ICD-10-CM | POA: Insufficient documentation

## 2018-01-03 DIAGNOSIS — J432 Centrilobular emphysema: Secondary | ICD-10-CM | POA: Insufficient documentation

## 2018-01-03 DIAGNOSIS — M542 Cervicalgia: Secondary | ICD-10-CM | POA: Insufficient documentation

## 2018-01-03 DIAGNOSIS — Z7952 Long term (current) use of systemic steroids: Secondary | ICD-10-CM | POA: Insufficient documentation

## 2018-01-03 MED ORDER — GABAPENTIN 300 MG PO CAPS: 300.0000 mg | ORAL_CAPSULE | Freq: Three times a day (TID) | ORAL | 3 refills | Status: DC

## 2018-01-03 MED ORDER — TIZANIDINE HCL 4 MG PO CAPS: 4.0000 mg | ORAL_CAPSULE | Freq: Two times a day (BID) | ORAL | 3 refills | Status: DC | PRN

## 2018-01-03 NOTE — Progress Notes (Signed)
Safety precautions to be maintained throughout the outpatient stay will include: orient to surroundings, keep bed in low position, maintain call bell within reach at all times, provide assistance with transfer out of bed and ambulation.  

## 2018-01-03 NOTE — Progress Notes (Signed)
Patient's Name: Steve Gregory  MRN: [id number]  Referring Provider: Kirk Ruths, MD  DOB: Jan 14, 1956  PCP: Kirk Ruths, MD  DOS: 01/03/2018  Note by: Gillis Santa, MD  Service setting: Ambulatory outpatient  Specialty: Interventional Pain Management  Location: [facility] (AMB) Pain Management Facility    Patient type: Established   Primary Reason(s) for Visit: Encounter for evaluation before starting new chronic pain management plan of care (Level of risk: moderate) CC: Back Pain (mid to lower); Neck Pain; and Knee Pain (left)  HPI  Steve Gregory is a 62 y.o. year old, male patient, who comes today for a follow-up evaluation to review the test results and decide on a treatment plan. He has Lower extremity pain, bilateral; Varicose veins of both lower extremities with pain; Essential hypertension; Tobacco dependence; COPD (chronic obstructive pulmonary disease) with emphysema ([place]); GERD (gastroesophageal reflux disease); Healthcare maintenance; History of depression; Hyperglycemia, unspecified; PVD (peripheral vascular disease) ([place]); Lung mass; Lumbosacral radiculopathy at S1; Chronic bilateral low back pain with bilateral sciatica; Lumbar degenerative disc disease; and Cervicalgia on their problem list. His primarily concern today is the Back Pain (mid to lower); Neck Pain; and Knee Pain (left)  Pain Assessment: Location: Mid, Lower Back Radiating: radiates up and down back from lower to neck Onset: More than a month ago Duration: Chronic pain Quality: Sharp, Throbbing, Constant Severity: 8 /10 (subjective, self-reported pain score)  Note: Reported level is inconsistent with clinical observations. Clinically the patient looks like a 3/10 A 3/10 is viewed as "Moderate" and described as significantly interfering with activities of daily living (ADL). It becomes difficult to feed, bathe, get dressed, get on and off the toilet or to perform personal hygiene functions. Difficult to get in and  out of bed or a chair without assistance. Very distracting. With effort, it can be ignored when deeply involved in activities.       When using our objective Pain Scale, levels between 6 and 10/10 are said to belong in an emergency room, as it progressively worsens from a 6/10, described as severely limiting, requiring emergency care not usually available at an outpatient pain management facility. At a 6/10 level, communication becomes difficult and requires great effort. Assistance to reach the emergency department may be required. Facial flushing and profuse sweating along with potentially dangerous increases in heart rate and blood pressure will be evident. Effect on ADL: pain makes it hard for pt to do anything; everything takes longer than it used to Timing: Constant Modifying factors: medications BP: (!) 153/87  HR: 76  Steve Gregory comes in today for a follow-up visit after his initial evaluation on 11/15/2017. Today we went over the results of his tests. These were explained in "Layman's terms". During today's appointment we went over my diagnostic impression, as well as the proposed treatment plan.  Patient presents today for follow-up.  Patient is tolerating gabapentin 300 mg 3 times daily without any significant side effects.  Is endorsing some benefit from it.  He is also taking tizanidine at night to help with muscle spasms and also insomnia.  Patient did have his lumbar MRI without contrast performed, results of which are below.  In considering the treatment plan options, Steve Gregory was reminded that I no longer take patients for medication management only. I asked him to let me know if he had no intention of taking advantage of the interventional therapies, so that we could make arrangements to provide this space to someone interested. I also made  it clear that undergoing interventional therapies for the purpose of getting pain medications is very inappropriate on the part of a patient, and it  will not be tolerated in this practice. This type of behavior would suggest true addiction and therefore it requires referral to an addiction specialist.   Further details on both, my assessment(s), as well as the proposed treatment plan, please see below.  Controlled Substance Pharmacotherapy Assessment REMS (Risk Evaluation and Mitigation Strategy)  Analgesic: Not applicable. Pill Count: None expected due to no prior prescriptions written by our practice. Steve Patience, RN  01/03/2018 11:52 AM  Sign at close encounter Safety precautions to be maintained throughout the outpatient stay will include: orient to surroundings, keep bed in low position, maintain call bell within reach at all times, provide assistance with transfer out of bed and ambulation.    Pharmacokinetics: Liberation and absorption (onset of action): WNL Distribution (time to peak effect): WNL Metabolism and excretion (duration of action): WNL          Undesirable effects: Side-effects or Adverse reactions: None reported Monitoring: [place] PMP: Online review of the past 7-monthperiod previously conducted. Not applicable at this point since we have not taken over the patient's medication management yet. List of other Serum/Urine Drug Screening Test(s):  No results found for: AMPHSCRSER, BARBSCRSER, BENZOSCRSER, COCAINSCRSER, COCAINSCRNUR, PCPSCRSER, THCSCRSER, THCU, CANNABQUANT, [place] EMalmoList of all UDS test(s) done:  Lab Results  Component Value Date   SUMMARY FINAL 11/15/2017   Last UDS on record: Summary  Date Value Ref Range Status  11/15/2017 FINAL  Final    Comment:    ==================================================================== TOXASSURE COMP DRUG ANALYSIS,UR ==================================================================== Test                             Result       Flag       Units Drug Present and Declared for Prescription Verification   Theophylline                    PRESENT      EXPECTED Drug Present not Declared for Prescription Verification   Carboxy-THC                    76           UNEXPECTED ng/mg creat    Carboxy-THC is a metabolite of tetrahydrocannabinol  (THC).    Source of [facility] most commonly illicit, but THC is also present    in a scheduled prescription medication. Drug Absent but Declared for Prescription Verification   Gabapentin                     Not Detected UNEXPECTED   Tizanidine                     Not Detected UNEXPECTED    Tizanidine, as indicated in the declared medication list, is not    always detected even when used as directed.   Salicylate                     Not Detected UNEXPECTED    Aspirin, as indicated in the declared medication list, is not    always detected even when used as directed. ==================================================================== Test                      Result  Flag   Units      Ref Range   Creatinine              80               mg/dL      >=20 ==================================================================== Declared Medications:  The flagging and interpretation on this report are based on the  following declared medications.  Unexpected results may arise from  inaccuracies in the declared medications.  **Note: The testing scope of this panel includes these medications:  Gabapentin  Theophylline  **Note: The testing scope of this panel does not include small to  moderate amounts of these reported medications:  Aspirin (Aspirin 81)  Tizanidine  **Note: The testing scope of this panel does not include following  reported medications:  Albuterol  Albuterol (Ipratropium-Albuterol)  Celecoxib  Cholecalciferol  Clotrimazole  Fluconazole  Fluticasone  Fluticasone (Advair)  Ipratropium  Ipratropium (Ipratropium-Albuterol)  Losartan (Losartan Potassium)  Meloxicam  Montelukast  Pantoprazole  Potassium  Prednisone  Salmeterol (Advair)   Sucralfate ==================================================================== For clinical consultation, please call [phone]. ====================================================================    UDS interpretation: Unexpected findings: Undeclared illicit substance detected Medication Assessment Form: Not applicable. No opioids. Treatment compliance: Not applicable Risk Assessment Profile: Aberrant behavior: See initial evaluations. None observed or detected today Comorbid factors increasing risk of overdose: See initial evaluation. No additional risks detected today Medical Psychology Evaluation: Please see scanned results in medical record. Opioid Risk Tool - 01/03/18 1204      Family History of Substance Abuse   Alcohol  Negative    Illegal Drugs  Negative    Rx Drugs  Negative      Personal History of Substance Abuse   Alcohol  Negative    Illegal Drugs  Negative    Rx Drugs  Negative      Age   Age between 40-45 years   No      History of Preadolescent Sexual Abuse   History of Preadolescent Sexual Abuse  Negative or Male      Psychological Disease   Psychological Disease  Negative    Depression  Negative      Total Score   Opioid Risk Tool Scoring  0    Opioid Risk Interpretation  Low Risk      ORT Scoring interpretation table:  Score <3 = Low Risk for SUD  Score between 4-7 = Moderate Risk for SUD  Score >8 = High Risk for Opioid Abuse   Risk Mitigation Strategies:  Patient opioid safety counseling: Opioid therapy will not be included in the treatment plan. Patient-Prescriber Agreement (PPA): No agreement signed.  Controlled substance notification to other providers: None required. No opioid therapy.  Pharmacologic Plan: Patient is not an appropriate candidate for opioid therapy at this time. UDS + THC  Laboratory Chemistry  Inflammation Markers (CRP: Acute Phase) (ESR: Chronic Phase) No results found for: CRP, ESRSEDRATE, LATICACIDVEN                        Rheumatology Markers No results found for: RF, ANA, Therisa Doyne, [facility]                      Renal Function Markers Lab Results  Component Value Date   BUN 12 04/03/2017   CREATININE 0.89 04/03/2017   GFRAA >60 04/03/2017   GFRNONAA >60 04/03/2017  Hepatic Function Markers Lab Results  Component Value Date   AST 15 04/03/2017   ALT 8 (L) 04/03/2017   ALBUMIN 3.8 04/03/2017   ALKPHOS 77 04/03/2017   LIPASE 232 01/31/2013                        Electrolytes Lab Results  Component Value Date   NA 140 04/03/2017   K 4.0 04/03/2017   CL 106 04/03/2017   CALCIUM 9.3 04/03/2017   MG 3.3 (H) 02/01/2014                        Neuropathy Markers No results found for: VITAMINB12, FOLATE, HGBA1C, HIV                      Bone Pathology Markers No results found for: VD25OH, VD125OH2TOT, HG9924QA8, TM1962IW9, 25OHVITD1, 25OHVITD2, 25OHVITD3, TESTOFREE, TESTOSTERONE                       Coagulation Parameters Lab Results  Component Value Date   INR 1.2 01/31/2014   LABPROT 14.9 (H) 01/31/2014   PLT 261 04/03/2017                        Cardiovascular Markers Lab Results  Component Value Date   BNP 6,953 (H) 01/31/2014   TROPONINI 0.04 01/31/2014   HGB 14.7 04/03/2017   HCT 44.1 04/03/2017                         CA Markers No results found for: CEA, CA125, LABCA2                      Note: Lab results reviewed.  Recent Diagnostic Imaging Review   Lumbosacral Imaging: Lumbar MR wo contrast:  Results for orders placed during the hospital encounter of 11/23/17  MR LUMBAR SPINE WO CONTRAST   Narrative CLINICAL DATA:  Chronic bilateral low back pain with bilateral sciatica  EXAM: MRI LUMBAR SPINE WITHOUT CONTRAST  TECHNIQUE: Multiplanar, multisequence MR imaging of the lumbar spine was performed. No intravenous contrast was administered.  COMPARISON:  None.  FINDINGS: Segmentation:   Normal  Alignment:  Normal  Vertebrae: Negative for fracture or mass. Schmorl's node superior endplate of L2 without significant edema  Conus medullaris and cauda equina: Conus extends to the T12-L1 level. Conus and cauda equina appear normal.  Paraspinal and other soft tissues: Negative for retroperitoneal mass. 4 cm left renal cyst. Paraspinous soft tissues normal.  Disc levels:  L1-2: Mild disc and facet degeneration. Schmorl's node superior endplate of L2 with surrounding fatty marrow. No stenosis  L2-3: Mild disc and facet degeneration without stenosis  L3-4: Mild disc and facet degeneration without stenosis  L4-5: Mild disc and facet degeneration without stenosis  L5-S1: Small right paracentral disc protrusion touching the right S1 nerve root. No definite nerve root compression.  IMPRESSION: Mild disc degeneration throughout the lumbar spine  Small right paracentral disc protrusion L5-S1 which is touching the right S1 nerve root. No definite nerve root compression.   Electronically Signed   By: Franchot Gallo M.D.   On: 11/23/2017 14:26     Lumbar DG (Complete) 4+V:  Results for orders placed during the hospital encounter of 04/03/17  DG Lumbar Spine Complete   Narrative CLINICAL DATA:  Low back pain for several weeks.  EXAM: LUMBAR SPINE - COMPLETE 4+ VIEW  COMPARISON:  None.  FINDINGS: There is no evidence of lumbar spine fracture. Alignment is normal. Mild degenerative disc disease at L1-2. Generalized osteopenia. No other bone lesions identified. Aortic atherosclerosis.  IMPRESSION: No acute findings.   Electronically Signed   By: Earle Gell M.D.   On: 04/03/2017 15:34     Complexity Note: Imaging results reviewed. Results shared with Mr. Kalas, using Layman's terms.                         Meds   Current Outpatient Medications:  .  albuterol (PROVENTIL) (2.5 MG/3ML) 0.083% nebulizer solution, Take 2.5 mg by nebulization every 6 (six)  hours as needed for wheezing or shortness of breath., Disp: , Rfl:  .  fluticasone (FLONASE) 50 MCG/ACT nasal spray, Place 2 sprays into both nostrils daily., Disp: , Rfl:  .  Fluticasone-Salmeterol (ADVAIR) 250-50 MCG/DOSE AEPB, Inhale 1 puff into the lungs 2 (two) times daily., Disp: , Rfl:  .  gabapentin (NEURONTIN) 300 MG capsule, Take 1 capsule (300 mg total) by mouth every 8 (eight) hours., Disp: 90 capsule, Rfl: 3 .  ipratropium (ATROVENT HFA) 17 MCG/ACT inhaler, Inhale into the lungs 3 (three) times daily., Disp: , Rfl:  .  ipratropium-albuterol (DUONEB) 0.5-2.5 (3) MG/3ML SOLN, Take 3 mLs by nebulization., Disp: , Rfl:  .  losartan (COZAAR) 25 MG tablet, Take 25 mg by mouth daily., Disp: , Rfl:  .  montelukast (SINGULAIR) 10 MG tablet, Take 10 mg by mouth every morning., Disp: , Rfl:  .  pantoprazole (PROTONIX) 40 MG tablet, Take 40 mg by mouth 2 (two) times daily. , Disp: , Rfl:  .  predniSONE (DELTASONE) 10 MG tablet, Take 15 mg by mouth daily with breakfast., Disp: , Rfl:  .  theophylline (UNIPHYL) 400 MG 24 hr tablet, Take 400 mg by mouth daily., Disp: , Rfl:  .  aspirin EC 81 MG tablet, Take by mouth., Disp: , Rfl:  .  celecoxib (CELEBREX) 100 MG capsule, TAKE 1 CAPSULE BY MOUTH 2 TIMES DAILY AS NEEDED FOR PAIN., Disp: , Rfl:  .  Cholecalciferol (VITAMIN D) 2000 units tablet, Take by mouth., Disp: , Rfl:  .  clotrimazole (MYCELEX) 10 MG troche, TAKE 1 TABLET BY MOUTH 5 TIMES DAILY FOR 10 DAYS. DISSOLVE THE TABLET SLOWLY AND COMPLETELY IN MOUTH, Disp: , Rfl: 0 .  KLOR-CON 10 10 MEQ tablet, TAKE 1 TABLET (10 MEQ TOTAL) BY MOUTH ONCE DAILY, Disp: , Rfl: 0 .  sucralfate (CARAFATE) 1 g tablet, Take by mouth., Disp: , Rfl:  .  tiZANidine (ZANAFLEX) 4 MG capsule, Take 1 capsule (4 mg total) by mouth 2 (two) times daily as needed for muscle spasms., Disp: 60 capsule, Rfl: 3  ROS  Constitutional: Denies any fever or chills Gastrointestinal: No reported hemesis, hematochezia, vomiting, or  acute GI distress Musculoskeletal: Denies any acute onset joint swelling, redness, loss of ROM, or weakness Neurological: No reported episodes of acute onset apraxia, aphasia, dysarthria, agnosia, amnesia, paralysis, loss of coordination, or loss of consciousness  Allergies  Mr. Hlavac is allergic to chantix [varenicline]; lipitor [atorvastatin]; mobic [meloxicam]; and statins.  PFSH  Drug: Mr. Mcginness  reports that he does not use drugs. Alcohol:  reports that he does not drink alcohol. Tobacco:  reports that he has been smoking.  He has a 21.50 pack-year smoking history. He has never used smokeless tobacco. Medical:  has a past medical  history of Allergy, Anxiety, Arthritis, Bronchitis, chronic (HCC), COPD (chronic obstructive pulmonary disease) ([place]), Cough, Depression, Dizziness, Essential hypertension (06/28/2017), GERD (gastroesophageal reflux disease), H/O emphysema, Headache, History of depression (06/05/2014), History of hiatal hernia, HOH (hard of hearing), Hypertension, Lung mass, Orthopnea, Oxygen decrease, Pneumonia, PVD (peripheral vascular disease) ([place]) (06/05/2014), Seizures ([place]), Shortness of breath dyspnea, Varicose veins of both lower extremities with pain (06/28/2017), and Wheezing. Surgical: Mr. Minehart  has a past surgical history that includes Facial reconstruction surgery (1980); Cataract extraction w/PHACO (Right, 10/27/2015); Eye surgery; Cataract extraction w/PHACO (Left, 11/17/2015); Esophagogastroduodenoscopy (egd) with propofol (N/A, 06/06/2017); and Colonoscopy with propofol (N/A, 07/28/2017). Family: family history includes Aneurysm in his father; Heart disease in his brother; Hyperlipidemia in his mother; Hypertension in his brother; Stroke in his father.  Constitutional Exam  General appearance: Well nourished, well developed, and well hydrated. In no apparent acute distress Vitals:   01/03/18 1152  BP: (!) 153/87  Pulse: 76  Resp: 16  Temp: 98 F (36.7 C)  TempSrc:  Oral  SpO2: 96%  Weight: 164 lb (74.4 kg)  Height: '5\' 9"'$  (1.753 m)   BMI Assessment: Estimated body mass index is 24.22 kg/m as calculated from the following:   Height as of this encounter: '5\' 9"'$  (1.753 m).   Weight as of this encounter: 164 lb (74.4 kg).  BMI interpretation table: BMI level Category Range association with higher incidence of chronic pain  <18 kg/m2 Underweight   18.5-24.9 kg/m2 Ideal body weight   25-29.9 kg/m2 Overweight Increased incidence by 20%  30-34.9 kg/m2 Obese (Class I) Increased incidence by 68%  35-39.9 kg/m2 Severe obesity (Class II) Increased incidence by 136%  >40 kg/m2 Extreme obesity (Class III) Increased incidence by 254%   Patient's current BMI Ideal Body weight  Body mass index is 24.22 kg/m. Ideal body weight: 70.7 kg (155 lb 13.8 oz) Adjusted ideal body weight: 72.2 kg (159 lb 1.9 oz)   BMI Readings from Last 4 Encounters:  01/03/18 24.22 kg/m  11/15/17 24.22 kg/m  10/10/17 24.20 kg/m  08/02/17 23.92 kg/m   Wt Readings from Last 4 Encounters:  01/03/18 164 lb (74.4 kg)  11/15/17 164 lb (74.4 kg)  10/10/17 166 lb 3.6 oz (75.4 kg)  08/02/17 164 lb 5.7 oz (74.6 kg)  Psych/Mental status: Alert, oriented x 3 (person, place, & time)       Eyes: PERLA Respiratory: No evidence of acute respiratory distress  Cervical Spine Area Exam  Skin & Axial Inspection: No masses, redness, edema, swelling, or associated skin lesions Alignment: Symmetrical Functional ROM: Unrestricted ROM      Stability: No instability detected Muscle Tone/Strength: Functionally intact. No obvious neuro-muscular anomalies detected. Sensory (Neurological): Unimpaired Palpation: No palpable anomalies              Upper Extremity (UE) Exam    Side: Right upper extremity  Side: Left upper extremity  Skin & Extremity Inspection: Skin color, temperature, and hair growth are WNL. No peripheral edema or cyanosis. No masses, redness, swelling, asymmetry, or associated  skin lesions. No contractures.  Skin & Extremity Inspection: Skin color, temperature, and hair growth are WNL. No peripheral edema or cyanosis. No masses, redness, swelling, asymmetry, or associated skin lesions. No contractures.  Functional ROM: Unrestricted ROM          Functional ROM: Unrestricted ROM          Muscle Tone/Strength: Functionally intact. No obvious neuro-muscular anomalies detected.  Muscle Tone/Strength: Functionally intact. No obvious neuro-muscular anomalies detected.  Sensory (Neurological): Unimpaired          Sensory (Neurological): Unimpaired          Palpation: No palpable anomalies              Palpation: No palpable anomalies              Specialized Test(s): Deferred         Specialized Test(s): Deferred          Thoracic Spine Area Exam  Skin & Axial Inspection: No masses, redness, or swelling Alignment: Symmetrical Functional ROM: Unrestricted ROM Stability: No instability detected Muscle Tone/Strength: Functionally intact. No obvious neuro-muscular anomalies detected. Sensory (Neurological): Unimpaired Muscle strength & Tone: No palpable anomalies  Lumbar Spine Area Exam  Skin & Axial Inspection: No masses, redness, or swelling Alignment: Asymmetric Functional ROM: Decreased ROM       Stability: No instability detected Muscle Tone/Strength: Functionally intact. No obvious neuro-muscular anomalies detected. Sensory (Neurological): Dermatomal pain pattern Palpation: No palpable anomalies       Provocative Tests: Lumbar Hyperextension and rotation test: evaluation deferred today       Lumbar Lateral bending test: Positive ipsilateral radicular pain, bilaterally. Positive for bilateral foraminal stenosis.  Left greater than right Patrick's Maneuver: evaluation deferred today                    Gait & Posture Assessment  Ambulation: Unassisted Gait: Relatively normal for age and body habitus Posture: WNL   Lower Extremity Exam    Side: Right lower  extremity  Side: Left lower extremity  Stability: No instability observed          Stability: No instability observed          Skin & Extremity Inspection: Skin color, temperature, and hair growth are WNL. No peripheral edema or cyanosis. No masses, redness, swelling, asymmetry, or associated skin lesions. No contractures.  Skin & Extremity Inspection: Skin color, temperature, and hair growth are WNL. No peripheral edema or cyanosis. No masses, redness, swelling, asymmetry, or associated skin lesions. No contractures.  Functional ROM: Unrestricted ROM                  Functional ROM: Unrestricted ROM                  Muscle Tone/Strength: Functionally intact. No obvious neuro-muscular anomalies detected.  Muscle Tone/Strength: Functionally intact. No obvious neuro-muscular anomalies detected.  Sensory (Neurological): Unimpaired  Sensory (Neurological): Unimpaired  Palpation: No palpable anomalies  Palpation: No palpable anomalies   Assessment & Plan  Primary Diagnosis & Pertinent Problem List: The primary encounter diagnosis was Lumbosacral radiculopathy at S1. Diagnoses of Chronic bilateral low back pain with bilateral sciatica, Lumbar degenerative disc disease, Cervicalgia, Lower extremity pain, bilateral, Centrilobular emphysema ([place]), and Chronic pain syndrome were also pertinent to this visit.  Visit Diagnosis: 1. Lumbosacral radiculopathy at S1   2. Chronic bilateral low back pain with bilateral sciatica   3. Lumbar degenerative disc disease   4. Cervicalgia   5. Lower extremity pain, bilateral   6. Centrilobular emphysema ([place])   7. Chronic pain syndrome    Problems updated and reviewed during this visit: Problem  Lumbosacral Radiculopathy At S1  Chronic Bilateral Low Back Pain With Bilateral Sciatica  Lumbar Degenerative Disc Disease  Cervicalgia   General Recommendations: The pain condition that the patient suffers from is best treated with a multidisciplinary approach that  involves an increase in physical activity to prevent  de-conditioning and worsening of the pain cycle, as well as psychological counseling (formal and/or informal) to address the co-morbid psychological affects of pain. Treatment will often involve judicious use of pain medications and interventional procedures to decrease the pain, allowing the patient to participate in the physical activity that will ultimately produce long-lasting pain reductions. The goal of the multidisciplinary approach is to return the patient to a higher level of overall function and to restore their ability to perform activities of daily living.  62 year old male with a history of a lung mass concerning for stage I lung cancer status post radiation therapy which has helped with his wheezing symptoms but he continues to have persistent cough.  He presents today with a chief complaint of axial low back pain that radiates into bilateral legs in a dermatomal fashion as well as chronic neck pain, cervicalgia that radiates predominantly to his left shoulder and proximal arm.  Patient was previously on opioid therapy which includes tramadol 50 mg twice daily, last fill 10/06/2017.  He has tried various anti-inflammatory medications in the form of NSAIDs including Celebrex, Mobic, ibuprofen which were not effective and resulted in GI discomfort.  Patient follows up today for his second patient visit.  His UDS results were reviewed which were positive for THC.  Patient states that he does utilize marijuana.  I told him of our clinic policy and that I would not be able to prescribe him opioid medications given his utilization of marijuana.  Patient does fine moderate benefit with gabapentin so we will continue at 300 mg 3 times daily with refills provided.  Patient also finds benefit from tizanidine which he takes at night for muscle relaxant properties as well as sleep aid.  He states that he does continue to wake up at night but does find the  medication helpful.  Will increase to 4 mg twice daily as needed for muscle spasms.  We also reviewed the patient's lumbar MRI today.  Patient's lumbar MRI shows small right paracentral disc protrusion at L5-S1 which is touching the right S1 nerve root.  However the patient is endorsing left-sided radicular pain that starts at his axial low back extends down his lateral thigh and down the back of his calf.  I discussed lumbar epidural steroid injection midline at L5-S1 to help with the symptoms of lumbosacral radiculopathy.  Patient is not interested in this procedure.  In discussing his concerns about the procedure, patient states that he does not want an injection in his spine even after reviewing injection details on a spine model.  Risks and benefits of the procedure were discussed and I have placed a PRN order for L5-S1 ESI should the patient want to proceed.  Plan: -Non-opioid management only (active MJ use) -Refill gabapentin and tizanidine as below -PRN L5-S1 ESI targeting left greater than right  Plan of Care  Pharmacotherapy (Medications Ordered): Meds ordered this encounter  Medications  . gabapentin (NEURONTIN) 300 MG capsule    Sig: Take 1 capsule (300 mg total) by mouth every 8 (eight) hours.    Dispense:  90 capsule    Refill:  3    Do not place this medication, or any other prescription from our practice, on "Automatic Refill". Patient may have prescription filled one day early if pharmacy is closed on scheduled refill date.  Marland Kitchen tiZANidine (ZANAFLEX) 4 MG capsule    Sig: Take 1 capsule (4 mg total) by mouth 2 (two) times daily as needed for muscle spasms.  Dispense:  60 capsule    Refill:  3    Do not place this medication, or any other prescription from our practice, on "Automatic Refill". Patient may have prescription filled one day early if pharmacy is closed on scheduled refill date.   Lab-work, procedure(s), and/or referral(s): Orders Placed This Encounter  Procedures   . Lumbar Epidural Injection   Provider-requested follow-up: Return in about 3 months (around 04/05/2018) for MM with Crystal. Time Note: Greater than 50% of the 25 minute(s) of face-to-face time spent with Mr. Cousins, was spent in counseling/coordination of care regarding: Mr. Pierre primary cause of pain, the results of his recent test(s), the significance of each one oth the test(s) anomalies and it's corresponding characteristic pain pattern(s), the treatment plan, treatment alternatives, the risks and possible complications of proposed treatment, realistic expectations and the goals of pain management (increased in functionality).  Future Appointments  Date Time Provider [place]  02/13/2018  2:00 PM [facility]  02/22/2018  1:30 PM Noreene Filbert, MD [facility] None  04/05/2018  1:30 PM Vevelyn Francois, NP [facility] None    Primary Care Physician: Kirk Ruths, MD Location: [facility] Note by: Gillis Santa, M.D Date: 01/03/2018; Time: 12:53 PM  Patient Instructions   Rx for gabapentin and tizanidine have been escribed to your pharmacy.  GENERAL RISKS AND COMPLICATIONS  What are the risk, side effects and possible complications? Generally speaking, most procedures are safe.  However, with any procedure there are risks, side effects, and the possibility of complications.  The risks and complications are dependent upon the sites that are lesioned, or the type of nerve block to be performed.  The closer the procedure is to the spine, the more serious the risks are.  Great care is taken when placing the radio frequency needles, block needles or lesioning probes, but sometimes complications can occur. 1. Infection: Any time there is an injection through the skin, there is a risk of infection.  This is why sterile conditions are used for these blocks.  There are four possible types of infection. 1. Localized skin  infection. 2. Central Nervous System Infection-This can be in the form of Meningitis, which can be deadly. 3. Epidural Infections-This can be in the form of an epidural abscess, which can cause pressure inside of the spine, causing compression of the spinal cord with subsequent paralysis. This would require an emergency surgery to decompress, and there are no guarantees that the patient would recover from the paralysis. 4. Discitis-This is an infection of the intervertebral discs.  It occurs in about 1% of discography procedures.  It is difficult to treat and it may lead to surgery.        2. Pain: the needles have to go through skin and soft tissues, will cause soreness.       3. Damage to internal structures:  The nerves to be lesioned may be near blood vessels or    other nerves which can be potentially damaged.       4. Bleeding: Bleeding is more common if the patient is taking blood thinners such as  aspirin, Coumadin, Ticiid, Plavix, etc., or if he/she have some genetic predisposition  such as hemophilia. Bleeding into the spinal canal can cause compression of the spinal  cord with subsequent paralysis.  This would require an emergency surgery to  decompress and there are no guarantees that the patient would recover from the  paralysis.  5. Pneumothorax:  Puncturing of a lung is a possibility, every time a needle is introduced in  the area of the chest or upper back.  Pneumothorax refers to free air around the  collapsed lung(s), inside of the thoracic cavity (chest cavity).  Another two possible  complications related to a similar event would include: Hemothorax and Chylothorax.   These are variations of the Pneumothorax, where instead of air around the collapsed  lung(s), you may have blood or chyle, respectively.       6. Spinal headaches: They may occur with any procedures in the area of the spine.       7. Persistent CSF (Cerebro-Spinal Fluid) leakage: This is a rare problem, but may occur   with prolonged intrathecal or epidural catheters either due to the formation of a fistulous  track or a dural tear.       8. Nerve damage: By working so close to the spinal cord, there is always a possibility of  nerve damage, which could be as serious as a permanent spinal cord injury with  paralysis.       9. Death:  Although rare, severe deadly allergic reactions known as "Anaphylactic  reaction" can occur to any of the medications used.      10. Worsening of the symptoms:  We can always make thing worse.  What are the chances of something like this happening? Chances of any of this occuring are extremely low.  By statistics, you have more of a chance of getting killed in a motor vehicle accident: while driving to the hospital than any of the above occurring .  Nevertheless, you should be aware that they are possibilities.  In general, it is similar to taking a shower.  Everybody knows that you can slip, hit your head and get killed.  Does that mean that you should not shower again?  Nevertheless always keep in mind that statistics do not mean anything if you happen to be on the wrong side of them.  Even if a procedure has a 1 (one) in a 1,000,000 (million) chance of going wrong, it you happen to be that one..Also, keep in mind that by statistics, you have more of a chance of having something go wrong when taking medications.  Who should not have this procedure? If you are on a blood thinning medication (e.g. Coumadin, Plavix, see list of "Blood Thinners"), or if you have an active infection going on, you should not have the procedure.  If you are taking any blood thinners, please inform your physician.  How should I prepare for this procedure?  Do not eat or drink anything at least six hours prior to the procedure.  Bring a driver with you .  It cannot be a taxi.  Come accompanied by an adult that can drive you back, and that is strong enough to help you if your legs get weak or numb from the  local anesthetic.  Take all of your medicines the morning of the procedure with just enough water to swallow them.  If you have diabetes, make sure that you are scheduled to have your procedure done first thing in the morning, whenever possible.  If you have diabetes, take only half of your insulin dose and notify our nurse that you have done so as soon as you arrive at the clinic.  If you are diabetic, but only take blood sugar pills (oral hypoglycemic), then do not take them on the morning of your  procedure.  You may take them after you have had the procedure.  Do not take aspirin or any aspirin-containing medications, at least eleven (11) days prior to the procedure.  They may prolong bleeding.  Wear loose fitting clothing that may be easy to take off and that you would not mind if it got stained with Betadine or blood.  Do not wear any jewelry or perfume  Remove any nail coloring.  It will interfere with some of our monitoring equipment.  NOTE: Remember that this is not meant to be interpreted as a complete list of all possible complications.  Unforeseen problems may occur.  BLOOD THINNERS The following drugs contain aspirin or other products, which can cause increased bleeding during surgery and should not be taken for 2 weeks prior to and 1 week after surgery.  If you should need take something for relief of minor pain, you may take acetaminophen which is found in Tylenol,m Datril, Anacin-3 and Panadol. It is not blood thinner. The products listed below are.  Do not take any of the products listed below in addition to any listed on your instruction sheet.  A.P.C or A.P.C with Codeine Codeine Phosphate Capsules #3 Ibuprofen Ridaura  ABC compound Congesprin Imuran rimadil  Advil Cope Indocin Robaxisal  Alka-Seltzer Effervescent Pain Reliever and Antacid Coricidin or Coricidin-D  Indomethacin Rufen  Alka-Seltzer plus Cold Medicine Cosprin Ketoprofen S-A-C Tablets  Anacin Analgesic  Tablets or Capsules Coumadin Korlgesic Salflex  Anacin Extra Strength Analgesic tablets or capsules CP-2 Tablets Lanoril Salicylate  Anaprox Cuprimine Capsules Levenox Salocol  Anexsia-D Dalteparin Magan Salsalate  Anodynos Darvon compound Magnesium Salicylate Sine-off  Ansaid Dasin Capsules Magsal Sodium Salicylate  Anturane Depen Capsules Marnal Soma  APF Arthritis pain formula Dewitt's Pills Measurin Stanback  Argesic Dia-Gesic Meclofenamic Sulfinpyrazone  Arthritis Bayer Timed Release Aspirin Diclofenac Meclomen Sulindac  Arthritis pain formula Anacin Dicumarol Medipren Supac  Analgesic (Safety coated) Arthralgen Diffunasal Mefanamic Suprofen  Arthritis Strength Bufferin Dihydrocodeine Mepro Compound Suprol  Arthropan liquid Dopirydamole Methcarbomol with Aspirin Synalgos  ASA tablets/Enseals Disalcid Micrainin Tagament  Ascriptin Doan's Midol Talwin  Ascriptin A/D Dolene Mobidin Tanderil  Ascriptin Extra Strength Dolobid Moblgesic Ticlid  Ascriptin with Codeine Doloprin or Doloprin with Codeine Momentum Tolectin  Asperbuf Duoprin Mono-gesic Trendar  Aspergum Duradyne Motrin or Motrin IB Triminicin  Aspirin plain, buffered or enteric coated Durasal Myochrisine Trigesic  Aspirin Suppositories Easprin Nalfon Trillsate  Aspirin with Codeine Ecotrin Regular or Extra Strength Naprosyn Uracel  Atromid-S Efficin Naproxen Ursinus  Auranofin Capsules Elmiron Neocylate Vanquish  Axotal Emagrin Norgesic Verin  Azathioprine Empirin or Empirin with Codeine Normiflo Vitamin E  Azolid Emprazil Nuprin Voltaren  Bayer Aspirin plain, buffered or children's or timed BC Tablets or powders Encaprin Orgaran Warfarin Sodium  Buff-a-Comp Enoxaparin Orudis Zorpin  Buff-a-Comp with Codeine Equegesic Os-Cal-Gesic   Buffaprin Excedrin plain, buffered or Extra Strength Oxalid   Bufferin Arthritis Strength Feldene Oxphenbutazone   Bufferin plain or Extra Strength Feldene Capsules Oxycodone with Aspirin    Bufferin with Codeine Fenoprofen Fenoprofen Pabalate or Pabalate-SF   Buffets II Flogesic Panagesic   Buffinol plain or Extra Strength Florinal or Florinal with Codeine Panwarfarin   Buf-Tabs Flurbiprofen Penicillamine   Butalbital Compound Four-way cold tablets Penicillin   Butazolidin Fragmin Pepto-Bismol   Carbenicillin Geminisyn Percodan   Carna Arthritis Reliever Geopen Persantine   Carprofen Gold's salt Persistin   Chloramphenicol Goody's Phenylbutazone   Chloromycetin Haltrain Piroxlcam   Clmetidine heparin Plaquenil   Cllnoril Hyco-pap Ponstel   Clofibrate  Hydroxy chloroquine Propoxyphen         Before stopping any of these medications, be sure to consult the physician who ordered them.  Some, such as Coumadin (Warfarin) are ordered to prevent or treat serious conditions such as "deep thrombosis", "pumonary embolisms", and other heart problems.  The amount of time that you may need off of the medication may also vary with the medication and the reason for which you were taking it.  If you are taking any of these medications, please make sure you notify your pain physician before you undergo any procedures.         Epidural Steroid Injection Patient Information  Description: The epidural space surrounds the nerves as they exit the spinal cord.  In some patients, the nerves can be compressed and inflamed by a bulging disc or a tight spinal canal (spinal stenosis).  By injecting steroids into the epidural space, we can bring irritated nerves into direct contact with a potentially helpful medication.  These steroids act directly on the irritated nerves and can reduce swelling and inflammation which often leads to decreased pain.  Epidural steroids may be injected anywhere along the spine and from the neck to the low back depending upon the location of your pain.   After numbing the skin with local anesthetic (like Novocaine), a small needle is passed into the epidural space slowly.   You may experience a sensation of pressure while this is being done.  The entire block usually last less than 10 minutes.  Conditions which may be treated by epidural steroids:   Low back and leg pain  Neck and arm pain  Spinal stenosis  Post-laminectomy syndrome  Herpes zoster (shingles) pain  Pain from compression fractures  Preparation for the injection:  1. Do not eat any solid food or dairy products within 8 hours of your appointment.  2. You may drink clear liquids up to 3 hours before appointment.  Clear liquids include water, black coffee, juice or soda.  No milk or cream please. 3. You may take your regular medication, including pain medications, with a sip of water before your appointment  Diabetics should hold regular insulin (if taken separately) and take 1/2 normal NPH dos the morning of the procedure.  Carry some sugar containing items with you to your appointment. 4. A driver must accompany you and be prepared to drive you home after your procedure.  5. Bring all your current medications with your. 6. An IV may be inserted and sedation may be given at the discretion of the physician.   7. A blood pressure cuff, EKG and other monitors will often be applied during the procedure.  Some patients may need to have extra oxygen administered for a short period. 8. You will be asked to provide medical information, including your allergies, prior to the procedure.  We must know immediately if you are taking blood thinners (like Coumadin/Warfarin)  Or if you are allergic to IV iodine contrast (dye). We must know if you could possible be pregnant.  Possible side-effects:  Bleeding from needle site  Infection (rare, may require surgery)  Nerve injury (rare)  Numbness & tingling (temporary)  Difficulty urinating (rare, temporary)  Spinal headache ( a headache worse with upright posture)  Light -headedness (temporary)  Pain at injection site (several days)  Decreased  blood pressure (temporary)  Weakness in arm/leg (temporary)  Pressure sensation in back/neck (temporary)  Call if you experience:  Fever/chills associated with headache or increased back/neck  pain.  Headache worsened by an upright position.  New onset weakness or numbness of an extremity below the injection site  Hives or difficulty breathing (go to the emergency room)  Inflammation or drainage at the infection site  Severe back/neck pain  Any new symptoms which are concerning to you  Please note:  Although the local anesthetic injected can often make your back or neck feel good for several hours after the injection, the pain will likely return.  It takes 3-7 days for steroids to work in the epidural space.  You may not notice any pain relief for at least that one week.  If effective, we will often do a series of three injections spaced 3-6 weeks apart to maximally decrease your pain.  After the initial series, we generally will wait several months before considering a repeat injection of the same type.  If you have any questions, please call [phone] [facility]

## 2018-01-03 NOTE — Patient Instructions (Signed)
Rx for gabapentin and tizanidine have been escribed to your pharmacy.  GENERAL RISKS AND COMPLICATIONS  What are the risk, side effects and possible complications? Generally speaking, most procedures are safe.  However, with any procedure there are risks, side effects, and the possibility of complications.  The risks and complications are dependent upon the sites that are lesioned, or the type of nerve block to be performed.  The closer the procedure is to the spine, the more serious the risks are.  Great care is taken when placing the radio frequency needles, block needles or lesioning probes, but sometimes complications can occur. 1. Infection: Any time there is an injection through the skin, there is a risk of infection.  This is why sterile conditions are used for these blocks.  There are four possible types of infection. 1. Localized skin infection. 2. Central Nervous System Infection-This can be in the form of Meningitis, which can be deadly. 3. Epidural Infections-This can be in the form of an epidural abscess, which can cause pressure inside of the spine, causing compression of the spinal cord with subsequent paralysis. This would require an emergency surgery to decompress, and there are no guarantees that the patient would recover from the paralysis. 4. Discitis-This is an infection of the intervertebral discs.  It occurs in about 1% of discography procedures.  It is difficult to treat and it may lead to surgery.        2. Pain: the needles have to go through skin and soft tissues, will cause soreness.       3. Damage to internal structures:  The nerves to be lesioned may be near blood vessels or    other nerves which can be potentially damaged.       4. Bleeding: Bleeding is more common if the patient is taking blood thinners such as  aspirin, Coumadin, Ticiid, Plavix, etc., or if he/she have some genetic predisposition  such as hemophilia. Bleeding into the spinal canal can cause  compression of the spinal  cord with subsequent paralysis.  This would require an emergency surgery to  decompress and there are no guarantees that the patient would recover from the  paralysis.       5. Pneumothorax:  Puncturing of a lung is a possibility, every time a needle is introduced in  the area of the chest or upper back.  Pneumothorax refers to free air around the  collapsed lung(s), inside of the thoracic cavity (chest cavity).  Another two possible  complications related to a similar event would include: Hemothorax and Chylothorax.   These are variations of the Pneumothorax, where instead of air around the collapsed  lung(s), you may have blood or chyle, respectively.       6. Spinal headaches: They may occur with any procedures in the area of the spine.       7. Persistent CSF (Cerebro-Spinal Fluid) leakage: This is a rare problem, but may occur  with prolonged intrathecal or epidural catheters either due to the formation of a fistulous  track or a dural tear.       8. Nerve damage: By working so close to the spinal cord, there is always a possibility of  nerve damage, which could be as serious as a permanent spinal cord injury with  paralysis.       9. Death:  Although rare, severe deadly allergic reactions known as "Anaphylactic  reaction" can occur to any of the medications used.  10. Worsening of the symptoms:  We can always make thing worse.  What are the chances of something like this happening? Chances of any of this occuring are extremely low.  By statistics, you have more of a chance of getting killed in a motor vehicle accident: while driving to the hospital than any of the above occurring .  Nevertheless, you should be aware that they are possibilities.  In general, it is similar to taking a shower.  Everybody knows that you can slip, hit your head and get killed.  Does that mean that you should not shower again?  Nevertheless always keep in mind that statistics do not mean  anything if you happen to be on the wrong side of them.  Even if a procedure has a 1 (one) in a 1,000,000 (million) chance of going wrong, it you happen to be that one..Also, keep in mind that by statistics, you have more of a chance of having something go wrong when taking medications.  Who should not have this procedure? If you are on a blood thinning medication (e.g. Coumadin, Plavix, see list of "Blood Thinners"), or if you have an active infection going on, you should not have the procedure.  If you are taking any blood thinners, please inform your physician.  How should I prepare for this procedure?  Do not eat or drink anything at least six hours prior to the procedure.  Bring a driver with you .  It cannot be a taxi.  Come accompanied by an adult that can drive you back, and that is strong enough to help you if your legs get weak or numb from the local anesthetic.  Take all of your medicines the morning of the procedure with just enough water to swallow them.  If you have diabetes, make sure that you are scheduled to have your procedure done first thing in the morning, whenever possible.  If you have diabetes, take only half of your insulin dose and notify our nurse that you have done so as soon as you arrive at the clinic.  If you are diabetic, but only take blood sugar pills (oral hypoglycemic), then do not take them on the morning of your procedure.  You may take them after you have had the procedure.  Do not take aspirin or any aspirin-containing medications, at least eleven (11) days prior to the procedure.  They may prolong bleeding.  Wear loose fitting clothing that may be easy to take off and that you would not mind if it got stained with Betadine or blood.  Do not wear any jewelry or perfume  Remove any nail coloring.  It will interfere with some of our monitoring equipment.  NOTE: Remember that this is not meant to be interpreted as a complete list of all possible  complications.  Unforeseen problems may occur.  BLOOD THINNERS The following drugs contain aspirin or other products, which can cause increased bleeding during surgery and should not be taken for 2 weeks prior to and 1 week after surgery.  If you should need take something for relief of minor pain, you may take acetaminophen which is found in Tylenol,m Datril, Anacin-3 and Panadol. It is not blood thinner. The products listed below are.  Do not take any of the products listed below in addition to any listed on your instruction sheet.  A.P.C or A.P.C with Codeine Codeine Phosphate Capsules #3 Ibuprofen Ridaura  ABC compound Congesprin Imuran rimadil  Advil Cope Indocin Robaxisal  Alka-Seltzer Effervescent Pain Reliever and Antacid Coricidin or Coricidin-D  Indomethacin Rufen  Alka-Seltzer plus Cold Medicine Cosprin Ketoprofen S-A-C Tablets  Anacin Analgesic Tablets or Capsules Coumadin Korlgesic Salflex  Anacin Extra Strength Analgesic tablets or capsules CP-2 Tablets Lanoril Salicylate  Anaprox Cuprimine Capsules Levenox Salocol  Anexsia-D Dalteparin Magan Salsalate  Anodynos Darvon compound Magnesium Salicylate Sine-off  Ansaid Dasin Capsules Magsal Sodium Salicylate  Anturane Depen Capsules Marnal Soma  APF Arthritis pain formula Dewitt's Pills Measurin Stanback  Argesic Dia-Gesic Meclofenamic Sulfinpyrazone  Arthritis Bayer Timed Release Aspirin Diclofenac Meclomen Sulindac  Arthritis pain formula Anacin Dicumarol Medipren Supac  Analgesic (Safety coated) Arthralgen Diffunasal Mefanamic Suprofen  Arthritis Strength Bufferin Dihydrocodeine Mepro Compound Suprol  Arthropan liquid Dopirydamole Methcarbomol with Aspirin Synalgos  ASA tablets/Enseals Disalcid Micrainin Tagament  Ascriptin Doan's Midol Talwin  Ascriptin A/D Dolene Mobidin Tanderil  Ascriptin Extra Strength Dolobid Moblgesic Ticlid  Ascriptin with Codeine Doloprin or Doloprin with Codeine Momentum Tolectin  Asperbuf Duoprin  Mono-gesic Trendar  Aspergum Duradyne Motrin or Motrin IB Triminicin  Aspirin plain, buffered or enteric coated Durasal Myochrisine Trigesic  Aspirin Suppositories Easprin Nalfon Trillsate  Aspirin with Codeine Ecotrin Regular or Extra Strength Naprosyn Uracel  Atromid-S Efficin Naproxen Ursinus  Auranofin Capsules Elmiron Neocylate Vanquish  Axotal Emagrin Norgesic Verin  Azathioprine Empirin or Empirin with Codeine Normiflo Vitamin E  Azolid Emprazil Nuprin Voltaren  Bayer Aspirin plain, buffered or children's or timed BC Tablets or powders Encaprin Orgaran Warfarin Sodium  Buff-a-Comp Enoxaparin Orudis Zorpin  Buff-a-Comp with Codeine Equegesic Os-Cal-Gesic   Buffaprin Excedrin plain, buffered or Extra Strength Oxalid   Bufferin Arthritis Strength Feldene Oxphenbutazone   Bufferin plain or Extra Strength Feldene Capsules Oxycodone with Aspirin   Bufferin with Codeine Fenoprofen Fenoprofen Pabalate or Pabalate-SF   Buffets II Flogesic Panagesic   Buffinol plain or Extra Strength Florinal or Florinal with Codeine Panwarfarin   Buf-Tabs Flurbiprofen Penicillamine   Butalbital Compound Four-way cold tablets Penicillin   Butazolidin Fragmin Pepto-Bismol   Carbenicillin Geminisyn Percodan   Carna Arthritis Reliever Geopen Persantine   Carprofen Gold's salt Persistin   Chloramphenicol Goody's Phenylbutazone   Chloromycetin Haltrain Piroxlcam   Clmetidine heparin Plaquenil   Cllnoril Hyco-pap Ponstel   Clofibrate Hydroxy chloroquine Propoxyphen         Before stopping any of these medications, be sure to consult the physician who ordered them.  Some, such as Coumadin (Warfarin) are ordered to prevent or treat serious conditions such as "deep thrombosis", "pumonary embolisms", and other heart problems.  The amount of time that you may need off of the medication may also vary with the medication and the reason for which you were taking it.  If you are taking any of these medications, please  make sure you notify your pain physician before you undergo any procedures.         Epidural Steroid Injection Patient Information  Description: The epidural space surrounds the nerves as they exit the spinal cord.  In some patients, the nerves can be compressed and inflamed by a bulging disc or a tight spinal canal (spinal stenosis).  By injecting steroids into the epidural space, we can bring irritated nerves into direct contact with a potentially helpful medication.  These steroids act directly on the irritated nerves and can reduce swelling and inflammation which often leads to decreased pain.  Epidural steroids may be injected anywhere along the spine and from the neck to the low back depending upon the location of your pain.   After  numbing the skin with local anesthetic (like Novocaine), a small needle is passed into the epidural space slowly.  You may experience a sensation of pressure while this is being done.  The entire block usually last less than 10 minutes.  Conditions which may be treated by epidural steroids:   Low back and leg pain  Neck and arm pain  Spinal stenosis  Post-laminectomy syndrome  Herpes zoster (shingles) pain  Pain from compression fractures  Preparation for the injection:  1. Do not eat any solid food or dairy products within 8 hours of your appointment.  2. You may drink clear liquids up to 3 hours before appointment.  Clear liquids include water, black coffee, juice or soda.  No milk or cream please. 3. You may take your regular medication, including pain medications, with a sip of water before your appointment  Diabetics should hold regular insulin (if taken separately) and take 1/2 normal NPH dos the morning of the procedure.  Carry some sugar containing items with you to your appointment. 4. A driver must accompany you and be prepared to drive you home after your procedure.  5. Bring all your current medications with your. 6. An IV may be  inserted and sedation may be given at the discretion of the physician.   7. A blood pressure cuff, EKG and other monitors will often be applied during the procedure.  Some patients may need to have extra oxygen administered for a short period. 8. You will be asked to provide medical information, including your allergies, prior to the procedure.  We must know immediately if you are taking blood thinners (like Coumadin/Warfarin)  Or if you are allergic to IV iodine contrast (dye). We must know if you could possible be pregnant.  Possible side-effects:  Bleeding from needle site  Infection (rare, may require surgery)  Nerve injury (rare)  Numbness & tingling (temporary)  Difficulty urinating (rare, temporary)  Spinal headache ( a headache worse with upright posture)  Light -headedness (temporary)  Pain at injection site (several days)  Decreased blood pressure (temporary)  Weakness in arm/leg (temporary)  Pressure sensation in back/neck (temporary)  Call if you experience:  Fever/chills associated with headache or increased back/neck pain.  Headache worsened by an upright position.  New onset weakness or numbness of an extremity below the injection site  Hives or difficulty breathing (go to the emergency room)  Inflammation or drainage at the infection site  Severe back/neck pain  Any new symptoms which are concerning to you  Please note:  Although the local anesthetic injected can often make your back or neck feel good for several hours after the injection, the pain will likely return.  It takes 3-7 days for steroids to work in the epidural space.  You may not notice any pain relief for at least that one week.  If effective, we will often do a series of three injections spaced 3-6 weeks apart to maximally decrease your pain.  After the initial series, we generally will wait several months before considering a repeat injection of the same type.  If you have any  questions, please call [phone] [facility]

## 2018-01-09 DIAGNOSIS — M48062 Spinal stenosis, lumbar region with neurogenic claudication: Secondary | ICD-10-CM | POA: Insufficient documentation

## 2018-01-11 ENCOUNTER — Telehealth: Payer: Self-pay

## 2018-01-11 NOTE — Telephone Encounter (Signed)
Pharmacy needed Tizanidine to be switched to tablets instead of capsules so that insurance will pay for it.  I gave approval to do that.  Could you please change it in your order so that it will be tablets from now on?  Thank you

## 2018-01-11 NOTE — Telephone Encounter (Signed)
Patient is having trouble getting his xanaflex from the pharmacy. He uses [facility].

## 2018-02-13 ENCOUNTER — Ambulatory Visit
Admission: RE | Admit: 2018-02-13 | Discharge: 2018-02-13 | Disposition: A | Discharge status: Home / Self Care | Payer: Medicare Other | Source: Ambulatory Visit | Attending: Radiation Oncology | Admitting: Radiation Oncology

## 2018-02-13 DIAGNOSIS — I2510 Atherosclerotic heart disease of native coronary artery without angina pectoris: Secondary | ICD-10-CM | POA: Diagnosis not present

## 2018-02-13 DIAGNOSIS — I700 Atherosclerosis of aorta: Secondary | ICD-10-CM | POA: Insufficient documentation

## 2018-02-13 DIAGNOSIS — J432 Centrilobular emphysema: Secondary | ICD-10-CM | POA: Insufficient documentation

## 2018-02-13 DIAGNOSIS — R918 Other nonspecific abnormal finding of lung field: Secondary | ICD-10-CM | POA: Insufficient documentation

## 2018-02-13 HISTORY — DX: Malignant (primary) neoplasm, unspecified: C80.1

## 2018-02-13 MED ORDER — IOPAMIDOL (ISOVUE-300) INJECTION 61%
75.0000 mL | Freq: Once | INTRAVENOUS | Status: AC | PRN
  Administered 2018-02-13: 75 mL via INTRAVENOUS

## 2018-02-22 ENCOUNTER — Other Ambulatory Visit: Payer: Self-pay | Admitting: *Deleted

## 2018-02-22 ENCOUNTER — Ambulatory Visit
Admission: RE | Admit: 2018-02-22 | Discharge: 2018-02-22 | Disposition: A | Discharge status: Home / Self Care | Payer: Medicare Other | Source: Ambulatory Visit | Attending: Radiation Oncology | Admitting: Radiation Oncology

## 2018-02-22 ENCOUNTER — Other Ambulatory Visit: Payer: Self-pay

## 2018-02-22 ENCOUNTER — Encounter: Payer: Self-pay | Admitting: Radiation Oncology

## 2018-02-22 VITALS — BP 116/70 | HR 48 | Temp 97.6°F | Resp 18 | Wt 159.7 lb

## 2018-02-22 DIAGNOSIS — R079 Chest pain, unspecified: Secondary | ICD-10-CM | POA: Diagnosis not present

## 2018-02-22 DIAGNOSIS — Z923 Personal history of irradiation: Secondary | ICD-10-CM | POA: Insufficient documentation

## 2018-02-22 DIAGNOSIS — R1013 Epigastric pain: Secondary | ICD-10-CM | POA: Diagnosis not present

## 2018-02-22 DIAGNOSIS — R918 Other nonspecific abnormal finding of lung field: Secondary | ICD-10-CM | POA: Insufficient documentation

## 2018-02-22 DIAGNOSIS — F17210 Nicotine dependence, cigarettes, uncomplicated: Secondary | ICD-10-CM | POA: Diagnosis not present

## 2018-02-22 DIAGNOSIS — C3411 Malignant neoplasm of upper lobe, right bronchus or lung: Secondary | ICD-10-CM | POA: Diagnosis not present

## 2018-02-22 DIAGNOSIS — J449 Chronic obstructive pulmonary disease, unspecified: Secondary | ICD-10-CM | POA: Diagnosis not present

## 2018-02-22 DIAGNOSIS — R5383 Other fatigue: Secondary | ICD-10-CM | POA: Insufficient documentation

## 2018-02-22 NOTE — Progress Notes (Signed)
Radiation Oncology Follow up Note  Name: Steve Gregory   Date:   02/22/2018 MRN:  [id number] DOB: August 25, 1956    This 62 y.o. male presents to the clinic today for four-month follow-up status post SB RT to his right upper lobe for stage I non-small cell lung cancer.  REFERRING PROVIDER: Kirk Ruths, MD  HPI: patient is a 62 year old male now out 4 months having completed SB RT to his right upper lobe for presumed stage I non-small cell lung cancer. He is seen today in routine follow-up and is doing fair. He states he's having multiple somatic complaints including fatigue and dyspepsia and some chest pain. He specifically denies hemoptysis or chest tightness. He's had a repeat CT scan showing complete response of the right upper lobe lesion although a new left upper lobe mass is now present consistent with malignancy..  COMPLICATIONS OF TREATMENT: none  FOLLOW UP COMPLIANCE: keeps appointments   PHYSICAL EXAM:  BP 116/70   Pulse (!) 48   Temp 97.6 F (36.4 C)   Resp 18   Wt 159 lb 11.6 oz (72.4 kg)   BMI 23.59 kg/m  Well-developed well-nourished patient in NAD. HEENT reveals PERLA, EOMI, discs not visualized.  Oral cavity is clear. No oral mucosal lesions are identified. Neck is clear without evidence of cervical or supraclavicular adenopathy. Lungs are clear to A&P. Cardiac examination is essentially unremarkable with regular rate and rhythm without murmur rub or thrill. Abdomen is benign with no organomegaly or masses noted. Motor sensory and DTR levels are equal and symmetric in the upper and lower extremities. Cranial nerves II through XII are grossly intact. Proprioception is intact. No peripheral adenopathy or edema is identified. No motor or sensory levels are noted. Crude visual fields are within normal range.  RADIOLOGY RESULTS: CT scans are reviewed and compatible with the above-stated findings showing new left upper lobe mass  PLAN: I've reviewed his CT scan showing  significant COPD emphysema. He has a new enlarging mass in his left upper lobe consistent with again a new primary bronchogenic carcinoma. I've offered SB RT 6000 cGy in 5 fractions to this site. Risks and benefits of SB RT were again reviewed with the patient. I personally set up and ordered CT simulation. Will use motion restriction protocol as well as 4D study for treatment planning purposes. I personally ordered and scheduled CT simulation. Patient Has my treatment plan well. I have reviewed his films with him personally.  I would like to take this opportunity to thank you for allowing me to participate in the care of your patient.Noreene Filbert, MD

## 2018-03-14 ENCOUNTER — Ambulatory Visit
Admission: RE | Admit: 2018-03-14 | Discharge: 2018-03-14 | Disposition: A | Discharge status: Home / Self Care | Payer: Medicare Other | Source: Ambulatory Visit | Attending: Radiation Oncology | Admitting: Radiation Oncology

## 2018-03-14 DIAGNOSIS — Z510 Encounter for antineoplastic radiation therapy: Secondary | ICD-10-CM | POA: Diagnosis not present

## 2018-03-14 DIAGNOSIS — C3412 Malignant neoplasm of upper lobe, left bronchus or lung: Secondary | ICD-10-CM | POA: Insufficient documentation

## 2018-03-15 DIAGNOSIS — Z510 Encounter for antineoplastic radiation therapy: Secondary | ICD-10-CM | POA: Diagnosis not present

## 2018-03-22 ENCOUNTER — Other Ambulatory Visit: Payer: Self-pay | Admitting: Gastroenterology

## 2018-03-22 DIAGNOSIS — R9389 Abnormal findings on diagnostic imaging of other specified body structures: Secondary | ICD-10-CM

## 2018-03-27 ENCOUNTER — Ambulatory Visit
Admission: RE | Admit: 2018-03-27 | Discharge: 2018-03-27 | Disposition: A | Discharge status: Home / Self Care | Payer: Medicare Other | Source: Ambulatory Visit | Attending: Radiation Oncology | Admitting: Radiation Oncology

## 2018-03-27 DIAGNOSIS — Z510 Encounter for antineoplastic radiation therapy: Secondary | ICD-10-CM | POA: Diagnosis not present

## 2018-03-29 ENCOUNTER — Ambulatory Visit
Admission: RE | Admit: 2018-03-29 | Discharge: 2018-03-29 | Disposition: A | Discharge status: Home / Self Care | Payer: Medicare Other | Source: Ambulatory Visit | Attending: Radiation Oncology | Admitting: Radiation Oncology

## 2018-03-29 DIAGNOSIS — Z510 Encounter for antineoplastic radiation therapy: Secondary | ICD-10-CM | POA: Diagnosis not present

## 2018-04-03 ENCOUNTER — Ambulatory Visit
Admission: RE | Admit: 2018-04-03 | Discharge: 2018-04-03 | Disposition: A | Discharge status: Home / Self Care | Payer: Medicare Other | Source: Ambulatory Visit | Attending: Radiation Oncology | Admitting: Radiation Oncology

## 2018-04-03 DIAGNOSIS — Z510 Encounter for antineoplastic radiation therapy: Secondary | ICD-10-CM | POA: Insufficient documentation

## 2018-04-03 DIAGNOSIS — C3412 Malignant neoplasm of upper lobe, left bronchus or lung: Secondary | ICD-10-CM | POA: Diagnosis not present

## 2018-04-04 ENCOUNTER — Encounter: Payer: Medicare Other | Admitting: Nurse Practitioner

## 2018-04-05 ENCOUNTER — Ambulatory Visit
Admission: RE | Admit: 2018-04-05 | Discharge: 2018-04-05 | Disposition: A | Discharge status: Home / Self Care | Payer: Medicare Other | Source: Ambulatory Visit | Attending: Radiation Oncology | Admitting: Radiation Oncology

## 2018-04-05 ENCOUNTER — Encounter: Payer: Medicare Other | Admitting: Nurse Practitioner

## 2018-04-05 DIAGNOSIS — Z510 Encounter for antineoplastic radiation therapy: Secondary | ICD-10-CM | POA: Diagnosis not present

## 2018-04-06 ENCOUNTER — Ambulatory Visit: Payer: Medicare Other

## 2018-04-10 ENCOUNTER — Ambulatory Visit
Admission: RE | Admit: 2018-04-10 | Discharge: 2018-04-10 | Disposition: A | Discharge status: Home / Self Care | Payer: Medicare Other | Source: Ambulatory Visit | Attending: Radiation Oncology | Admitting: Radiation Oncology

## 2018-04-10 DIAGNOSIS — Z510 Encounter for antineoplastic radiation therapy: Secondary | ICD-10-CM | POA: Diagnosis not present

## 2018-04-11 ENCOUNTER — Ambulatory Visit: Payer: Medicare Other | Attending: Nurse Practitioner | Admitting: Nurse Practitioner

## 2018-04-11 ENCOUNTER — Other Ambulatory Visit: Payer: Self-pay

## 2018-04-11 ENCOUNTER — Encounter: Payer: Self-pay | Admitting: Nurse Practitioner

## 2018-04-11 DIAGNOSIS — M549 Dorsalgia, unspecified: Secondary | ICD-10-CM | POA: Diagnosis present

## 2018-04-11 DIAGNOSIS — M5441 Lumbago with sciatica, right side: Secondary | ICD-10-CM | POA: Insufficient documentation

## 2018-04-11 DIAGNOSIS — Z9841 Cataract extraction status, right eye: Secondary | ICD-10-CM | POA: Diagnosis not present

## 2018-04-11 DIAGNOSIS — J439 Emphysema, unspecified: Secondary | ICD-10-CM | POA: Diagnosis not present

## 2018-04-11 DIAGNOSIS — Z8249 Family history of ischemic heart disease and other diseases of the circulatory system: Secondary | ICD-10-CM | POA: Diagnosis not present

## 2018-04-11 DIAGNOSIS — M5417 Radiculopathy, lumbosacral region: Secondary | ICD-10-CM | POA: Insufficient documentation

## 2018-04-11 DIAGNOSIS — Z79899 Other long term (current) drug therapy: Secondary | ICD-10-CM | POA: Insufficient documentation

## 2018-04-11 DIAGNOSIS — Z886 Allergy status to analgesic agent status: Secondary | ICD-10-CM | POA: Diagnosis not present

## 2018-04-11 DIAGNOSIS — Z823 Family history of stroke: Secondary | ICD-10-CM | POA: Diagnosis not present

## 2018-04-11 DIAGNOSIS — R739 Hyperglycemia, unspecified: Secondary | ICD-10-CM | POA: Diagnosis not present

## 2018-04-11 DIAGNOSIS — Z7982 Long term (current) use of aspirin: Secondary | ICD-10-CM | POA: Insufficient documentation

## 2018-04-11 DIAGNOSIS — K219 Gastro-esophageal reflux disease without esophagitis: Secondary | ICD-10-CM | POA: Insufficient documentation

## 2018-04-11 DIAGNOSIS — Z888 Allergy status to other drugs, medicaments and biological substances status: Secondary | ICD-10-CM | POA: Insufficient documentation

## 2018-04-11 DIAGNOSIS — M48062 Spinal stenosis, lumbar region with neurogenic claudication: Secondary | ICD-10-CM | POA: Insufficient documentation

## 2018-04-11 DIAGNOSIS — R918 Other nonspecific abnormal finding of lung field: Secondary | ICD-10-CM | POA: Diagnosis not present

## 2018-04-11 DIAGNOSIS — M542 Cervicalgia: Secondary | ICD-10-CM | POA: Diagnosis not present

## 2018-04-11 DIAGNOSIS — M5136 Other intervertebral disc degeneration, lumbar region: Secondary | ICD-10-CM | POA: Insufficient documentation

## 2018-04-11 DIAGNOSIS — Z789 Other specified health status: Secondary | ICD-10-CM | POA: Insufficient documentation

## 2018-04-11 DIAGNOSIS — I10 Essential (primary) hypertension: Secondary | ICD-10-CM | POA: Insufficient documentation

## 2018-04-11 DIAGNOSIS — F17210 Nicotine dependence, cigarettes, uncomplicated: Secondary | ICD-10-CM | POA: Diagnosis not present

## 2018-04-11 DIAGNOSIS — Z9842 Cataract extraction status, left eye: Secondary | ICD-10-CM | POA: Insufficient documentation

## 2018-04-11 DIAGNOSIS — R42 Dizziness and giddiness: Secondary | ICD-10-CM | POA: Insufficient documentation

## 2018-04-11 DIAGNOSIS — I739 Peripheral vascular disease, unspecified: Secondary | ICD-10-CM | POA: Diagnosis not present

## 2018-04-11 DIAGNOSIS — H9190 Unspecified hearing loss, unspecified ear: Secondary | ICD-10-CM | POA: Diagnosis not present

## 2018-04-11 DIAGNOSIS — Z7952 Long term (current) use of systemic steroids: Secondary | ICD-10-CM | POA: Insufficient documentation

## 2018-04-11 DIAGNOSIS — M899 Disorder of bone, unspecified: Secondary | ICD-10-CM | POA: Diagnosis not present

## 2018-04-11 DIAGNOSIS — I83813 Varicose veins of bilateral lower extremities with pain: Secondary | ICD-10-CM | POA: Diagnosis not present

## 2018-04-11 DIAGNOSIS — M5442 Lumbago with sciatica, left side: Secondary | ICD-10-CM | POA: Diagnosis not present

## 2018-04-11 DIAGNOSIS — Z7951 Long term (current) use of inhaled steroids: Secondary | ICD-10-CM | POA: Insufficient documentation

## 2018-04-11 MED ORDER — GABAPENTIN 300 MG PO CAPS: 300.0000 mg | ORAL_CAPSULE | Freq: Four times a day (QID) | ORAL | 2 refills | Status: DC

## 2018-04-11 NOTE — Patient Instructions (Signed)
____________________________________________________________________________________________  Medication Rules  Applies to: All patients receiving prescriptions (written or electronic).  Pharmacy of record: Pharmacy where electronic prescriptions will be sent. If written prescriptions are taken to a different pharmacy, please inform the nursing staff. The pharmacy listed in the electronic medical record should be the one where you would like electronic prescriptions to be sent.  Prescription refills: Only during scheduled appointments. Applies to both, written and electronic prescriptions.  NOTE: The following applies primarily to controlled substances (Opioid* Pain Medications).   Patient's responsibilities: 1. Pain Pills: Bring all pain pills to every appointment (except for procedure appointments). 2. Pill Bottles: Bring pills in original pharmacy bottle. Always bring newest bottle. Bring bottle, even if empty. 3. Medication refills: You are responsible for knowing and keeping track of what medications you need refilled. The day before your appointment, write a list of all prescriptions that need to be refilled. Bring that list to your appointment and give it to the admitting nurse. Prescriptions will be written only during appointments. If you forget a medication, it will not be "Called in", "Faxed", or "electronically sent". You will need to get another appointment to get these prescribed. 4. Prescription Accuracy: You are responsible for carefully inspecting your prescriptions before leaving our office. Have the discharge nurse carefully go over each prescription with you, before taking them home. Make sure that your name is accurately spelled, that your address is correct. Check the name and dose of your medication to make sure it is accurate. Check the number of pills, and the written instructions to make sure they are clear and accurate. Make sure that you are given enough medication to last  until your next medication refill appointment. 5. Taking Medication: Take medication as prescribed. Never take more pills than instructed. Never take medication more frequently than prescribed. Taking less pills or less frequently is permitted and encouraged, when it comes to controlled substances (written prescriptions).  6. Inform other Doctors: Always inform, all of your healthcare providers, of all the medications you take. 7. Pain Medication from other Providers: You are not allowed to accept any additional pain medication from any other Doctor or Healthcare provider. There are two exceptions to this rule. (see below) In the event that you require additional pain medication, you are responsible for notifying us, as stated below. 8. Medication Agreement: You are responsible for carefully reading and following our Medication Agreement. This must be signed before receiving any prescriptions from our practice. Safely store a copy of your signed Agreement. Violations to the Agreement will result in no further prescriptions. (Additional copies of our Medication Agreement are available upon request.) 9. Laws, Rules, & Regulations: All patients are expected to follow all Federal and State Laws, Statutes, Rules, & Regulations. Ignorance of the Laws does not constitute a valid excuse. The use of any illegal substances is prohibited. 10. Adopted CDC guidelines & recommendations: Target dosing levels will be at or below 60 MME/day. Use of benzodiazepines** is not recommended.  Exceptions: There are only two exceptions to the rule of not receiving pain medications from other Healthcare Providers. 1. Exception #1 (Emergencies): In the event of an emergency (i.e.: accident requiring emergency care), you are allowed to receive additional pain medication. However, you are responsible for: As soon as you are able, call our office [phone], at any time of the day or night, and leave a message stating your name, the  date and nature of the emergency, and the name and dose of the medication   prescribed. In the event that your call is answered by a member of our staff, make sure to document and save the date, time, and the name of the person that took your information.  2. Exception #2 (Planned Surgery): In the event that you are scheduled by another doctor or dentist to have any type of surgery or procedure, you are allowed (for a period no longer than 30 days), to receive additional pain medication, for the acute post-op pain. However, in this case, you are responsible for picking up a copy of our "Post-op Pain Management for Surgeons" handout, and giving it to your surgeon or dentist. This document is available at our office, and does not require an appointment to obtain it. Simply go to our office during business hours (Monday-Thursday from 8:00 AM to 4:00 PM) (Friday 8:00 AM to 12:00 Noon) or if you have a scheduled appointment with us, prior to your surgery, and ask for it by name. In addition, you will need to provide us with your name, name of your surgeon, type of surgery, and date of procedure or surgery.  *Opioid medications include: morphine, codeine, oxycodone, oxymorphone, hydrocodone, hydromorphone, meperidine, tramadol, tapentadol, buprenorphine, fentanyl, methadone. **Benzodiazepine medications include: diazepam (Valium), alprazolam (Xanax), clonazepam (Klonopine), lorazepam (Ativan), clorazepate (Tranxene), chlordiazepoxide (Librium), estazolam (Prosom), oxazepam (Serax), temazepam (Restoril), triazolam (Halcion) (Last updated: 10/27/2017) ____________________________________________________________________________________________    

## 2018-04-11 NOTE — Progress Notes (Signed)
Patient's Name: Steve Gregory  MRN: [id number]  Referring Provider: Kirk Ruths, MD  DOB: 04-14-1956  PCP: Kirk Ruths, MD  DOS: 04/11/2018  Note by: Vevelyn Francois NP  Service setting: Ambulatory outpatient  Specialty: Interventional Pain Management  Location: [facility] (AMB) Pain Management Facility    Patient type: Established    Primary Reason(s) for Visit: Encounter for prescription drug management. (Level of risk: moderate)  CC: Back Pain  HPI  Steve Gregory is a 62 y.o. year old, male patient, who comes today for a medication management evaluation. He has Lower extremity pain, bilateral; Varicose veins of both lower extremities with pain; HTN (hypertension), benign; Tobacco dependence; COPD (chronic obstructive pulmonary disease) with emphysema ([place]); GERD (gastroesophageal reflux disease); Healthcare maintenance; History of depression; Hyperglycemia, unspecified; PVD (peripheral vascular disease) ([place]); Lung mass; Lumbosacral radiculopathy at S1; Chronic bilateral low back pain with bilateral sciatica; Lumbar degenerative disc disease; Cervicalgia; Spinal stenosis of lumbar region with neurogenic claudication; Pharmacologic therapy; Disorder of skeletal system; and Problems influencing health status on their problem list. His primarily concern today is the Back Pain  Pain Assessment: Location: Mid, Lower, Upper Back Radiating: pain radiaties down both leg, worse left leg Onset: More than a month ago Duration: Chronic pain Quality: Constant, Aching, Shooting, Throbbing Severity: 8 /10 (subjective, self-reported pain score)  Note: Reported level is compatible with observation. Clinically the patient looks like a 2/10 A 2/10 is viewed as "Mild to Moderate" and described as noticeable and distracting. Impossible to hide from other people. More frequent flare-ups. Still possible to adapt and function close to normal. It can be very annoying and may have occasional stronger flare-ups.  With discipline, patients may get used to it and adapt. Information on the proper use of the pain scale provided to the patient today. When using our objective Pain Scale, levels between 6 and 10/10 are said to belong in an emergency room, as it progressively worsens from a 6/10, described as severely limiting, requiring emergency care not usually available at an outpatient pain management facility. At a 6/10 level, communication becomes difficult and requires great effort. Assistance to reach the emergency department may be required. Facial flushing and profuse sweating along with potentially dangerous increases in heart rate and blood pressure will be evident. Effect on ADL: limits my daily activities Timing: Constant Modifying factors: nothing BP: (!) 153/86  HR: 93  Steve Gregory was last scheduled for an appointment on Visit date not found for medication management. During today's appointment we reviewed Steve Gregory chronic pain status, as well as his outpatient medication regimen. He admits that he has cramps on his feet. He admits that the pain goes into the back of his legs to his ankles. He does not feel like the Gabapentin is effective.  He admits that he is only taking 2 tablets per day instead of the prescribed 3. He takes 1 tablet at night and 1 in the morning.He admits that he has problems with his stomach. He is going to have EGD on tomorrow.  He admits that he just completed radiation for his lung cancer. He admits that he does have to do a lot of things on his own because he lives alone.  He does not feel that the muscle relaxer's are effective. He feels like it may relax him too much.  The patient  reports that he does not use drugs. His body mass index is 24.81 kg/m.  Further details on both, my assessment(s), as well  as the proposed treatment plan, please see below.  Laboratory Chemistry  Inflammation Markers (CRP: Acute Phase) (ESR: Chronic Phase) No results found for: CRP,  ESRSEDRATE, LATICACIDVEN                       Rheumatology Markers No results found for: RF, ANA, LABURIC, URICUR, LYMEIGGIGMAB, LYMEABIGMQN, HLAB27                      Renal Function Markers Lab Results  Component Value Date   BUN 12 04/03/2017   CREATININE 0.89 04/03/2017   GFRAA >60 04/03/2017   GFRNONAA >60 04/03/2017                             Hepatic Function Markers Lab Results  Component Value Date   AST 15 04/03/2017   ALT 8 (L) 04/03/2017   ALBUMIN 3.8 04/03/2017   ALKPHOS 77 04/03/2017   LIPASE 232 01/31/2013                        Electrolytes Lab Results  Component Value Date   NA 140 04/03/2017   K 4.0 04/03/2017   CL 106 04/03/2017   CALCIUM 9.3 04/03/2017   MG 3.3 (H) 02/01/2014                        Neuropathy Markers No results found for: VITAMINB12, FOLATE, HGBA1C, HIV                      Bone Pathology Markers No results found for: VD25OH, VD125OH2TOT, EY8144YJ8, HU3149FW2, 25OHVITD1, 25OHVITD2, 25OHVITD3, TESTOFREE, TESTOSTERONE                       Coagulation Parameters Lab Results  Component Value Date   INR 1.2 01/31/2014   LABPROT 14.9 (H) 01/31/2014   PLT 261 04/03/2017                        Cardiovascular Markers Lab Results  Component Value Date   BNP 6,953 (H) 01/31/2014   TROPONINI 0.04 01/31/2014   HGB 14.7 04/03/2017   HCT 44.1 04/03/2017                         CA Markers No results found for: CEA, CA125, LABCA2                      Note: Lab results reviewed.  Recent Diagnostic Imaging Results  CT Chest W Contrast CLINICAL DATA:  Right-sided lung mass with radiation therapy last treated in January. right-sided chest pain for several months. Current smoker.  EXAM: CT CHEST WITH CONTRAST  TECHNIQUE: Multidetector CT imaging of the chest was performed during intravenous contrast administration.  CONTRAST:  59m ISOVUE-300 IOPAMIDOL (ISOVUE-300) INJECTION 61%  COMPARISON:  PET 05/03/2017.  Diagnostic  chest CT 04/21/2017.  FINDINGS: Cardiovascular: Aortic and branch vessel atherosclerosis. Normal heart size with minimal pericardial fluid or thickening anteriorly. Mild lipomatous hypertrophy of the interatrial septum. Left main coronary artery atherosclerosis. No central pulmonary embolism, on this non-dedicated study.  Mediastinum/Nodes: No supraclavicular adenopathy. Similar size of small prevascular and middle mediastinal nodes. None are pathologic by size criteria. A right hilar node is upper normal size at 11 mm and similar;  not hypermetabolic on prior PET.  Lungs/Pleura: No pleural fluid. Moderate paraseptal and centrilobular emphysema.  The previously described 9 mm right upper lobe pulmonary nodule is decreased to resolved. There may be minimal residual soft tissue density in this area, much less nodular at 6 mm on image 48/3.  Right greater than left bibasilar interstitial thickening is subpleural in distribution and nonspecific, possibly due to post infectious or inflammatory scarring. This is not significantly changed. Development of a subpleural left upper lobe bilobed, spiculated pulmonary nodule. Superiorly, this measures 2.2 x 2.0 cm on image 46/3. More inferiorly, 1.8 x 1.5 cm on image 50/3. On the order of 2.7 cm craniocaudal on coronal image 86.  Upper Abdomen: Right larger than left hepatic cysts. Normal imaged portions of the spleen, pancreas, gallbladder, right adrenal gland. Apparent proximal gastric wall thickening is favored to be due to underdistention, including at 2.5 cm on image 155/2. Interpolar left renal incompletely imaged lesion is fluid density and likely a cyst. Minimal left adrenal nodularity is unchanged. Advanced abdominal aortic atherosclerosis.  Musculoskeletal: No acute osseous abnormality.  IMPRESSION: 1. Response to therapy of right upper lobe pulmonary nodule, with improvement to resolution as detailed above. 2. 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 3. No thoracic adenopathy. 4. Aortic atherosclerosis (ICD10-I70.0), coronary artery atherosclerosis and emphysema (ICD10-J43.9). 5. Gastric underdistention. Apparent greater curvature wall thickening is at least partially secondary. Correlate with any symptoms to suggest gastritis.  These results will be called to the ordering clinician or representative by the Radiologist Assistant, and communication documented in the PACS or zVision Dashboard.  Electronically Signed   By: Abigail Miyamoto M.D.   On: 02/13/2018 16:11  Complexity Note: Imaging results reviewed. Results shared with Steve Gregory, using Layman's terms.                         Meds   Current Outpatient Medications:  .  albuterol (PROVENTIL) (2.5 MG/3ML) 0.083% nebulizer solution, Take 2.5 mg by nebulization every 6 (six) hours as needed for wheezing or shortness of breath., Disp: , Rfl:  .  aspirin EC 81 MG tablet, Take by mouth., Disp: , Rfl:  .  fluticasone (FLONASE) 50 MCG/ACT nasal spray, Place 2 sprays into both nostrils daily., Disp: , Rfl:  .  Fluticasone-Salmeterol (ADVAIR) 250-50 MCG/DOSE AEPB, Inhale 1 puff into the lungs 2 (two) times daily., Disp: , Rfl:  .  gabapentin (NEURONTIN) 300 MG capsule, Take 1 capsule (300 mg total) by mouth 4 (four) times daily., Disp: 120 capsule, Rfl: 2 .  ipratropium (ATROVENT HFA) 17 MCG/ACT inhaler, Inhale into the lungs 3 (three) times daily., Disp: , Rfl:  .  ipratropium-albuterol (DUONEB) 0.5-2.5 (3) MG/3ML SOLN, Take 3 mLs by nebulization., Disp: , Rfl:  .  losartan (COZAAR) 25 MG tablet, Take 25 mg by mouth daily., Disp: , Rfl:  .  montelukast (SINGULAIR) 10 MG tablet, Take 10 mg by mouth every morning., Disp: , Rfl:  .  pantoprazole (PROTONIX) 40 MG tablet, Take 40 mg by mouth 2 (two) times daily. , Disp: , Rfl:  .  predniSONE (DELTASONE) 10 MG tablet,  Take 15 mg by mouth daily with breakfast., Disp: , Rfl:  .  sucralfate (CARAFATE) 1 g tablet, Take by mouth., Disp: , Rfl:  .  theophylline (UNIPHYL) 400 MG 24 hr tablet, Take 400 mg by mouth daily., Disp: , Rfl:   ROS  Constitutional: Denies any fever or chills Gastrointestinal: No reported hemesis, hematochezia, vomiting, or acute GI distress Musculoskeletal: Denies any acute onset joint swelling, redness, loss of ROM, or weakness Neurological: No reported episodes of acute onset apraxia, aphasia, dysarthria, agnosia, amnesia, paralysis, loss of coordination, or loss of consciousness  Allergies  Steve Gregory is allergic to chantix [varenicline]; lipitor [atorvastatin]; mobic [meloxicam]; and statins.  PFSH  Drug: Steve Gregory  reports that he does not use drugs. Alcohol:  reports that he does not drink alcohol. Tobacco:  reports that he has been smoking. He has a 21.50 pack-year smoking history. He has never used smokeless tobacco. Medical:  has a past medical history of Allergy, Anxiety, Arthritis, Bronchitis, chronic ([place]), Cancer ([place]), COPD (chronic obstructive pulmonary disease) ([place]), Cough, Depression, Dizziness, Essential hypertension (06/28/2017), GERD (gastroesophageal reflux disease), H/O emphysema, Headache, History of depression (06/05/2014), History of hiatal hernia, HOH (hard of hearing), Hypertension, Lung mass, Orthopnea, Oxygen decrease, Pneumonia, PVD (peripheral vascular disease) ([place]) (06/05/2014), Seizures ([place]), Shortness of breath dyspnea, Varicose veins of both lower extremities with pain (06/28/2017), and Wheezing. Surgical: Steve Gregory  has a past surgical history that includes Facial reconstruction surgery (1980); Cataract extraction w/PHACO (Right, 10/27/2015); Eye surgery; Cataract extraction w/PHACO (Left, 11/17/2015); Esophagogastroduodenoscopy (egd) with propofol (N/A, 06/06/2017); and Colonoscopy with propofol (N/A, 07/28/2017). Family: family history includes Aneurysm in  his father; Heart disease in his brother; Hyperlipidemia in his mother; Hypertension in his brother; Stroke in his father.  Constitutional Exam  General appearance: Well nourished, well developed, and well hydrated. In no apparent acute distress Vitals:   04/11/18 1116  BP: (!) 153/86  Pulse: 93  Temp: 98 F (36.7 C)  SpO2: 94%  Weight: 168 lb (76.2 kg)  Height: _0  (1.753 m)  Psych/Mental status: Alert, oriented x 3 (person, place, & time)       Eyes: PERLA Respiratory: No evidence of acute respiratory distress  Lumbar Spine Area Exam  Skin & Axial Inspection: No masses, redness, or swelling Alignment: Symmetrical Functional ROM: Unrestricted ROM       Stability: No instability detected Muscle Tone/Strength: Functionally intact. No obvious neuro-muscular anomalies detected. Sensory (Neurological): Unimpaired Palpation: No palpable anomalies       Provocative Tests: Hyperextension/rotation test: deferred today       Lumbar quadrant test (Kemp's test): deferred today       Lateral bending test: deferred today       Patrick's Maneuver: deferred today                   FABER test: deferred today                   S-I anterior distraction/compression test: deferred today         S-I lateral compression test: deferred today         S-I Thigh-thrust test: deferred today         S-I Gaenslen's test: deferred today          Gait & Posture Assessment  Ambulation: Unassisted Gait: Relatively normal for age and body habitus Posture: WNL   Lower Extremity Exam    Side: Right lower extremity  Side: Left lower extremity  Stability: No instability observed          Stability: No instability observed          Skin & Extremity Inspection: Skin color, temperature, and hair growth are WNL. No peripheral edema or cyanosis. No masses, redness, swelling, asymmetry, or  associated skin lesions. No contractures.  Skin & Extremity Inspection: Skin color, temperature, and hair growth are WNL. No  peripheral edema or cyanosis. No masses, redness, swelling, asymmetry, or associated skin lesions. No contractures.  Functional ROM: Unrestricted ROM                  Functional ROM: Unrestricted ROM                  Muscle Tone/Strength: Functionally intact. No obvious neuro-muscular anomalies detected.  Muscle Tone/Strength: Functionally intact. No obvious neuro-muscular anomalies detected.  Sensory (Neurological): Unimpaired  Sensory (Neurological): Unimpaired  Palpation: No palpable anomalies  Palpation: No palpable anomalies   Assessment  Primary Diagnosis & Pertinent Problem List: Diagnoses of Pharmacologic therapy, Disorder of skeletal system, and Problems influencing health status were pertinent to this visit.  Status Diagnosis  Controlled Controlled Controlled 1. Pharmacologic therapy   2. Disorder of skeletal system   3. Problems influencing health status     Problems updated and reviewed during this visit: Problem  Pharmacologic Therapy  Disorder of Skeletal System  Problems Influencing Health Status  Spinal Stenosis of Lumbar Region With Neurogenic Claudication  Lung Mass   Overview:  On ct and pos pet, Dr. Genevive Bi to see  On ct and pos pet, Post xry from Dr. Donella Stade   Hyperglycemia, Unspecified   Last Assessment & Plan:  Glucose is controlled and followed   Healthcare Maintenance   Overview:  Pneumovax 2015 and prevnar 13 rx 3-18, severe copd noted. Full code  Formatting of this note might be different from the original. Pneumovax 2015 and prevnar 13 rx 3-18, severe copd noted. Full code  MEDICARE WELLNESS VISIT  PROVIDERS RENDERING CARE Dr Ouida Sills, Dr Vella Kohler, [facility]  FUNCTIONAL ASSESSMENT  (1) Hearing: Demonstrates normal hearing in conversation.  (2) Risk of Falls: No reports of falls or abnormal balance. Gait is observed to be good upon observation.  (3) Home Safety; Home is safe and secure (4) Activities of Daily Living; Household chores and  grooming are managed without problems. Personal finances are managed without problems.   DEPRESSION SCREENING There does not seem to be loss of interest in activities nor excess crying or changes in sleep or appetite.   COGNITIVE SCREENING Orientation is appropriate as are responses to questions and general conversation. No reports of forgetfulness or losing things.    PREVENTION PLAN Aggressive screening not warranted given severe lung disease Glaucoma; yearly eye exams Pneumonia; Pneumovax 2015 and prevnar 13 prescription in 3-18 Shingles; Shigrix benefit is likely not terribly beneficial given severe lung disease Influenza; Yearly flu vaccine Smoking Cessation; Discussed, hold lung ct screening given severe lung disease   OTHER PERSONALIZED HEALTH ADVISE Weight sustaining diet, really cannot exercise  END OF LIFE CARE WANTS Wants full code after discussion given age     Frazier Richards MD   Pvd (Peripheral Vascular Disease) ([facility])   Overview:  Smoker with poor pulses  Last Assessment & Plan:  No leg pain is noted of late.   Smoker with poor pulses  Last Assessment & Plan:  No real claudication   Htn (Hypertension), Benign   Last Assessment & Plan:  Taking medications without noted side effects or dizziness.     Copd (Chronic Obstructive Pulmonary Disease) With Emphysema ([facility])   Overview:  Chronic prednisone  Last Assessment & Plan:  No flair ups noted recently and breathing is essentially at baseline.    Chronic prednisone  Last Assessment & Plan:  Breathing has been essentially at baseline without a recent flair being noted.    Plan of Care  Pharmacotherapy (Medications Ordered): Meds ordered this encounter  Medications  . gabapentin (NEURONTIN) 300 MG capsule    Sig: Take 1 capsule (300 mg total) by mouth 4 (four) times daily.    Dispense:  120 capsule    Refill:  2    Do not place this medication, or any other prescription from our  practice, on "Automatic Refill". Patient may have prescription filled one day early if pharmacy is closed on scheduled refill date.    Order Specific Question:   Supervising Provider    Answer:   Milinda Pointer [[id number]]   New Prescriptions   No medications on file   Medications administered today: Steve Gregory had no medications administered during this visit. Lab-work, procedure(s), and/or referral(s): Orders Placed This Encounter  Procedures  . Magnesium  . Vitamin B12  . Sedimentation rate  . 25-Hydroxyvitamin D Lcms D2+D3  . C-reactive protein   Imaging and/or referral(s): None  Interventional therapies: Planned, scheduled, and/or pending:   Not at this time. Increased Gabapentin and may have to change to [place] if he has needs that for gastric issues.   Provider-requested follow-up: Return in about 3 months (around 07/12/2018) for MedMgmt with Me Donella Stade [facility]).  Future Appointments  Date Time Provider [place]  04/12/2018  1:30 PM [id number]  05/11/2018  2:00 PM Noreene Filbert, MD [facility] None  07/10/2018 11:30 AM Vevelyn Francois, NP [facility] None   Primary Care Physician: Kirk Ruths, MD Location: [facility] Out[facility] Note by: Vevelyn Francois NP Date: 04/11/2018; Time: 1:13 PM  Pain Score Disclaimer: We use the NRS-11 scale. This is a self-reported, subjective measurement of pain severity with only modest accuracy. It is used primarily to identify changes within a particular patient. It must be understood that outpatient pain scales are significantly less accurate that those used for research, where they can be applied under ideal controlled circumstances with minimal exposure to variables. In reality, the score is likely to be a combination of pain intensity and pain affect, where pain affect describes the degree of emotional arousal or changes in action readiness caused by the sensory experience of pain.  Factors such as social and work situation, setting, emotional state, anxiety levels, expectation, and prior pain experience may influence pain perception and show large inter-individual differences that may also be affected by time variables.  Patient instructions provided during this appointment: Patient Instructions  ____________________________________________________________________________________________  Medication Rules  Applies to: All patients receiving prescriptions (written or electronic).  Pharmacy of record: Pharmacy where electronic prescriptions will be sent. If written prescriptions are taken to a different pharmacy, please inform the nursing staff. The pharmacy listed in the electronic medical record should be the one where you would like electronic prescriptions to be sent.  Prescription refills: Only during scheduled appointments. Applies to both, written and electronic prescriptions.  NOTE: The following applies primarily to controlled substances (Opioid* Pain Medications).   Patient's responsibilities: 1. Pain Pills: Bring all pain pills to every appointment (except for procedure appointments). 2. Pill Bottles: Bring pills in original pharmacy bottle. Always bring newest bottle. Bring bottle, even if empty. 3. Medication refills: You are responsible for knowing and keeping track of what medications you need refilled. The day before your appointment, write a list of all prescriptions that need to be refilled. Bring that list to your appointment and give  it to the admitting nurse. Prescriptions will be written only during appointments. If you forget a medication, it will not be "Called in", "Faxed", or "electronically sent". You will need to get another appointment to get these prescribed. 4. Prescription Accuracy: You are responsible for carefully inspecting your prescriptions before leaving our office. Have the discharge nurse carefully go over each prescription with you,  before taking them home. Make sure that your name is accurately spelled, that your address is correct. Check the name and dose of your medication to make sure it is accurate. Check the number of pills, and the written instructions to make sure they are clear and accurate. Make sure that you are given enough medication to last until your next medication refill appointment. 5. Taking Medication: Take medication as prescribed. Never take more pills than instructed. Never take medication more frequently than prescribed. Taking less pills or less frequently is permitted and encouraged, when it comes to controlled substances (written prescriptions).  6. Inform other Doctors: Always inform, all of your healthcare providers, of all the medications you take. 7. Pain Medication from other Providers: You are not allowed to accept any additional pain medication from any other Doctor or Healthcare provider. There are two exceptions to this rule. (see below) In the event that you require additional pain medication, you are responsible for notifying us, as stated below. 8. Medication Agreement: You are responsible for carefully reading and following our Medication Agreement. This must be signed before receiving any prescriptions from our practice. Safely store a copy of your signed Agreement. Violations to the Agreement will result in no further prescriptions. (Additional copies of our Medication Agreement are available upon request.) 9. Laws, Rules, & Regulations: All patients are expected to follow all Federal and [employer], [employer], Rules, [employer]. Ignorance of the Laws does not constitute a valid excuse. The use of any illegal substances is prohibited. 10. Adopted CDC guidelines & recommendations: Target dosing levels will be at or below 60 MME/day. Use of benzodiazepines** is not recommended.  Exceptions: There are only two exceptions to the rule of not receiving pain medications from other Healthcare  Providers. 1. Exception #1 (Emergencies): In the event of an emergency (i.e.: accident requiring emergency care), you are allowed to receive additional pain medication. However, you are responsible for: As soon as you are able, call our office [phone], at any time of the day or night, and leave a message stating your name, the date and nature of the emergency, and the name and dose of the medication prescribed. In the event that your call is answered by a member of our staff, make sure to document and save the date, time, and the name of the person that took your information.  2. Exception #2 (Planned Surgery): In the event that you are scheduled by another doctor or dentist to have any type of surgery or procedure, you are allowed (for a period no longer than 30 days), to receive additional pain medication, for the acute post-op pain. However, in this case, you are responsible for picking up a copy of our "Post-op Pain Management for Surgeons" handout, and giving it to your surgeon or dentist. This document is available at our office, and does not require an appointment to obtain it. Simply go to our office during business hours (Monday-Thursday from 8:00 AM to 4:00 PM) (Friday 8:00 AM to 12:00 Noon) or if you have a scheduled appointment with [place], prior to your surgery, and ask for it  by name. In addition, you will need to provide [place] with your name, name of your surgeon, type of surgery, and date of procedure or surgery.  *Opioid medications include: morphine, codeine, oxycodone, oxymorphone, hydrocodone, hydromorphone, meperidine, tramadol, tapentadol, buprenorphine, fentanyl, methadone. **Benzodiazepine medications include: diazepam (Valium), alprazolam (Xanax), clonazepam (Klonopine), lorazepam (Ativan), clorazepate (Tranxene), chlordiazepoxide (Librium), estazolam (Prosom), oxazepam (Serax), temazepam (Restoril), triazolam (Halcion) (Last updated:  10/27/2017) ____________________________________________________________________________________________

## 2018-04-12 ENCOUNTER — Ambulatory Visit
Admission: RE | Admit: 2018-04-12 | Discharge: 2018-04-12 | Disposition: A | Discharge status: Home / Self Care | Payer: Medicare Other | Source: Ambulatory Visit | Attending: Gastroenterology | Admitting: Gastroenterology

## 2018-04-12 DIAGNOSIS — K219 Gastro-esophageal reflux disease without esophagitis: Secondary | ICD-10-CM | POA: Insufficient documentation

## 2018-04-12 DIAGNOSIS — R933 Abnormal findings on diagnostic imaging of other parts of digestive tract: Secondary | ICD-10-CM | POA: Insufficient documentation

## 2018-04-12 DIAGNOSIS — R9389 Abnormal findings on diagnostic imaging of other specified body structures: Secondary | ICD-10-CM

## 2018-04-12 DIAGNOSIS — K2980 Duodenitis without bleeding: Secondary | ICD-10-CM | POA: Insufficient documentation

## 2018-04-12 DIAGNOSIS — K449 Diaphragmatic hernia without obstruction or gangrene: Secondary | ICD-10-CM | POA: Insufficient documentation

## 2018-04-12 DIAGNOSIS — K2970 Gastritis, unspecified, without bleeding: Secondary | ICD-10-CM | POA: Insufficient documentation

## 2018-04-16 LAB — MAGNESIUM: MAGNESIUM: 2.4 mg/dL — AB (ref 1.6–2.3)

## 2018-04-16 LAB — 25-HYDROXYVITAMIN D LCMS D2+D3: 25-HYDROXY, VITAMIN D: 21 ng/mL — AB

## 2018-04-16 LAB — VITAMIN B12: VITAMIN B 12: 428 pg/mL (ref 232–1245)

## 2018-04-16 LAB — SEDIMENTATION RATE: Sed Rate: 15 mm/hr (ref 0–30)

## 2018-04-16 LAB — 25-HYDROXY VITAMIN D LCMS D2+D3: 25-Hydroxy, Vitamin D-3: 21 ng/mL

## 2018-04-16 LAB — C-REACTIVE PROTEIN: CRP: 10 mg/L (ref 0–10)

## 2018-04-18 ENCOUNTER — Encounter: Payer: Self-pay | Admitting: Nurse Practitioner

## 2018-04-18 DIAGNOSIS — E559 Vitamin D deficiency, unspecified: Secondary | ICD-10-CM | POA: Insufficient documentation

## 2018-05-11 ENCOUNTER — Ambulatory Visit
Admission: RE | Admit: 2018-05-11 | Discharge: 2018-05-11 | Disposition: A | Discharge status: Home / Self Care | Payer: Medicare Other | Source: Ambulatory Visit | Attending: Radiation Oncology | Admitting: Radiation Oncology

## 2018-05-11 ENCOUNTER — Other Ambulatory Visit: Payer: Self-pay

## 2018-05-11 ENCOUNTER — Encounter: Payer: Self-pay | Admitting: Radiation Oncology

## 2018-05-11 ENCOUNTER — Other Ambulatory Visit: Payer: Self-pay | Admitting: *Deleted

## 2018-05-11 VITALS — BP 127/78 | HR 98 | Temp 97.5°F | Resp 18 | Wt 162.7 lb

## 2018-05-11 DIAGNOSIS — R918 Other nonspecific abnormal finding of lung field: Secondary | ICD-10-CM

## 2018-05-11 DIAGNOSIS — C3412 Malignant neoplasm of upper lobe, left bronchus or lung: Secondary | ICD-10-CM

## 2018-05-11 NOTE — Progress Notes (Signed)
Radiation Oncology Follow up Note  Name: Steve Gregory   Date:   05/11/2018 MRN:  [id number] DOB: Jan 07, 1956    This 62 y.o. 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 He is also 9 months out from treatment to his.right upper lobe for stage I non-small cell lung cancer he seen today in routine follow-up is doing well is a mild nonproductive cough no hemoptysis no increased chest tightness or dyspnea on exertion. He is having no dysphagia.  COMPLICATIONS OF TREATMENT: none  FOLLOW UP COMPLIANCE: keeps appointments   PHYSICAL EXAM:  BP 127/78 (BP Location: Left Arm, Patient Position: Sitting)   Pulse 98   Temp (!) 97.5 F (36.4 C) (Tympanic)   Resp 18   Wt 162 lb 11.2 oz (73.8 kg)   BMI 24.03 kg/m  Well-developed well-nourished patient in NAD. HEENT reveals PERLA, EOMI, discs not visualized.  Oral cavity is clear. No oral mucosal lesions are identified. Neck is clear without evidence of cervical or supraclavicular adenopathy. Lungs are clear to A&P. Cardiac examination is essentially unremarkable with regular rate and rhythm without murmur rub or thrill. Abdomen is benign with no organomegaly or masses noted. Motor sensory and DTR levels are equal and symmetric in the upper and lower extremities. Cranial nerves II through XII are grossly intact. Proprioception is intact. No peripheral adenopathy or edema is identified. No motor or sensory levels are noted. Crude visual fields are within normal range.  RADIOLOGY RESULTS: no current films for review  PLAN: at the present time I've asked to see him back in 3 months for follow-up and will obtain a CT scan with contrast of his chest prior to that visit. He apparently  clinically he is doing well with no sniffing it side effect profile from his to SB RT's. Patient knows to call with any concerns at any time.  I would like to take this opportunity to thank you for allowing me to participate in the care of your patient.Noreene Filbert, MD

## 2018-05-15 ENCOUNTER — Ambulatory Visit: Payer: Medicare Other | Admitting: Radiation Oncology

## 2018-07-10 ENCOUNTER — Encounter: Payer: Self-pay | Admitting: Nurse Practitioner

## 2018-07-10 ENCOUNTER — Other Ambulatory Visit: Payer: Self-pay

## 2018-07-10 ENCOUNTER — Ambulatory Visit: Payer: Medicare Other | Attending: Nurse Practitioner | Admitting: Nurse Practitioner

## 2018-07-10 VITALS — BP 153/85 | HR 92 | Temp 97.6°F | Ht 69.0 in | Wt 168.0 lb

## 2018-07-10 DIAGNOSIS — I10 Essential (primary) hypertension: Secondary | ICD-10-CM | POA: Diagnosis not present

## 2018-07-10 DIAGNOSIS — M79604 Pain in right leg: Secondary | ICD-10-CM | POA: Diagnosis not present

## 2018-07-10 DIAGNOSIS — M5442 Lumbago with sciatica, left side: Secondary | ICD-10-CM | POA: Diagnosis not present

## 2018-07-10 DIAGNOSIS — Z79899 Other long term (current) drug therapy: Secondary | ICD-10-CM | POA: Insufficient documentation

## 2018-07-10 DIAGNOSIS — Z8249 Family history of ischemic heart disease and other diseases of the circulatory system: Secondary | ICD-10-CM | POA: Insufficient documentation

## 2018-07-10 DIAGNOSIS — J440 Chronic obstructive pulmonary disease with acute lower respiratory infection: Secondary | ICD-10-CM | POA: Insufficient documentation

## 2018-07-10 DIAGNOSIS — R739 Hyperglycemia, unspecified: Secondary | ICD-10-CM | POA: Diagnosis not present

## 2018-07-10 DIAGNOSIS — K219 Gastro-esophageal reflux disease without esophagitis: Secondary | ICD-10-CM | POA: Diagnosis not present

## 2018-07-10 DIAGNOSIS — M48062 Spinal stenosis, lumbar region with neurogenic claudication: Secondary | ICD-10-CM | POA: Insufficient documentation

## 2018-07-10 DIAGNOSIS — M545 Low back pain: Secondary | ICD-10-CM | POA: Insufficient documentation

## 2018-07-10 DIAGNOSIS — M5136 Other intervertebral disc degeneration, lumbar region: Secondary | ICD-10-CM | POA: Diagnosis not present

## 2018-07-10 DIAGNOSIS — Z5181 Encounter for therapeutic drug level monitoring: Secondary | ICD-10-CM | POA: Insufficient documentation

## 2018-07-10 DIAGNOSIS — M5417 Radiculopathy, lumbosacral region: Secondary | ICD-10-CM | POA: Diagnosis not present

## 2018-07-10 DIAGNOSIS — M79605 Pain in left leg: Secondary | ICD-10-CM

## 2018-07-10 DIAGNOSIS — G8929 Other chronic pain: Secondary | ICD-10-CM

## 2018-07-10 DIAGNOSIS — Z888 Allergy status to other drugs, medicaments and biological substances status: Secondary | ICD-10-CM | POA: Diagnosis not present

## 2018-07-10 DIAGNOSIS — F17200 Nicotine dependence, unspecified, uncomplicated: Secondary | ICD-10-CM | POA: Insufficient documentation

## 2018-07-10 DIAGNOSIS — M792 Neuralgia and neuritis, unspecified: Secondary | ICD-10-CM | POA: Insufficient documentation

## 2018-07-10 DIAGNOSIS — G894 Chronic pain syndrome: Secondary | ICD-10-CM

## 2018-07-10 DIAGNOSIS — E559 Vitamin D deficiency, unspecified: Secondary | ICD-10-CM | POA: Diagnosis not present

## 2018-07-10 DIAGNOSIS — M5441 Lumbago with sciatica, right side: Secondary | ICD-10-CM

## 2018-07-10 DIAGNOSIS — Z7982 Long term (current) use of aspirin: Secondary | ICD-10-CM | POA: Diagnosis not present

## 2018-07-10 DIAGNOSIS — Z823 Family history of stroke: Secondary | ICD-10-CM | POA: Insufficient documentation

## 2018-07-10 MED ORDER — PREGABALIN 50 MG PO CAPS: 50.0000 mg | ORAL_CAPSULE | Freq: Three times a day (TID) | ORAL | 0 refills | Status: DC

## 2018-07-10 NOTE — Patient Instructions (Addendum)
Electronic Rx for pregabalin (LYRICA) 50 MG capsule  ______________________________________________________________________________________________  Specialty Pain Scale  Introduction:  There are significant differences in how pain is reported. The word pain usually refers to physical pain, but it is also a common synonym of suffering. The medical community uses a scale from 0 (zero) to 10 (ten) to report pain level. Zero (0) is described as "no pain", while ten (10) is described as "the worse pain you can imagine". The problem with this scale is that physical pain is reported along with suffering. Suffering refers to mental pain, or more often yet it refers to any unpleasant feeling, emotion or aversion associated with the perception of harm or threat of harm. It is the psychological component of pain.  Pain Specialists prefer to separate the two components. The pain scale used by this practice is the Verbal Numerical Rating Scale (VNRS-11). This scale is for the physical pain only. DO NOT INCLUDE how your pain psychologically affects you. This scale is for adults 61 years of age and older. It has 11 (eleven) levels. The 1st level is 0/10. This means: "right now, I have no pain". In the context of pain management, it also means: "right now, my physical pain is under control with the current therapy".  General Information:  The scale should reflect your current level of pain. Unless you are specifically asked for the level of your worst pain, or your average pain. If you are asked for one of these two, then it should be understood that it is over the past 24 hours.  Levels 1 (one) through 5 (five) are described below, and can be treated as an outpatient. Ambulatory pain management facilities such as ours are more than adequate to treat these levels. Levels 6 (six) through 10 (ten) are also described below, however, these must be treated as a hospitalized patient. While levels 6 (six) and 7 (seven) may  be evaluated at an urgent care facility, levels 8 (eight) through 10 (ten) constitute medical emergencies and as such, they belong in a hospital's emergency department. When having these levels (as described below), do not come to our office. Our facility is not equipped to manage these levels. Go directly to an urgent care facility or an emergency department to be evaluated.  Definitions:  Activities of Daily Living (ADL): Activities of daily living (ADL or ADLs) is a term used in healthcare to refer to people's daily self-care activities. Health professionals often use a person's ability or inability to perform ADLs as a measurement of their functional status, particularly in regard to people post injury, with disabilities and the elderly. There are two ADL levels: Basic 61 and Instrumental. Basic Activities of Daily Living (BADL  or BADLs) consist of self-care tasks that include: Bathing and showering; personal hygiene and grooming (including brushing/combing/styling hair); dressing; Toilet hygiene (getting to the toilet, cleaning oneself, and getting back up); eating and self-feeding (not including cooking or chewing and swallowing); functional mobility, often referred to as "transferring", as measured by the ability to walk, get in and out of bed, and get into and out of a chair; the broader definition (moving from one place to another while performing activities) is useful for people with different physical abilities who are still able to get around independently. Basic ADLs include the things many people do when they get up in the morning and get ready to go out of the house: get out of bed, go to the toilet, bathe, dress, groom, and eat. On the  average, loss of function typically follows a particular order. Hygiene is the first to go, followed by loss of toilet use and locomotion. The last to go is the ability to eat. When there is only one remaining area in which the person is independent, there is a  62.9% chance that it is eating and only a 3.5% chance that it is hygiene. Instrumental Activities of Daily Living (IADL or IADLs) are not necessary for fundamental functioning, but they let an individual live independently in a community. IADL consist of tasks that include: cleaning and maintaining the house; home establishment and maintenance; care of others (including selecting and supervising caregivers); care of pets; child rearing; managing money; managing financials (investments, etc.); meal preparation and cleanup; shopping for groceries and necessities; moving within the community; safety procedures and emergency responses; health management and maintenance (taking prescribed medications); and using the telephone or other form of communication.  Instructions:  Most patients tend to report their pain as a combination of two factors, their physical pain and their psychosocial pain. This last one is also known as "suffering" and it is reflection of how physical pain affects you socially and psychologically. From now on, report them separately.  From this point on, when asked to report your pain level, report only your physical pain. Use the following table for reference.  Pain Clinic Pain Levels (0-5/10)  Pain Level Score  Description  No Pain 0   Mild pain 1 Nagging, annoying, but does not interfere with basic activities of daily living (ADL). Patients are able to eat, bathe, get dressed, toileting (being able to get on and off the toilet and perform personal hygiene functions), transfer (move in and out of bed or a chair without assistance), and maintain continence (able to control bladder and bowel functions). Blood pressure and heart rate are unaffected. A normal heart rate for a healthy adult ranges from 60 to 100 bpm (beats per minute).   Mild to moderate pain 2 Noticeable and distracting. Impossible to hide from other people. More frequent flare-ups. Still possible to adapt and function close  to normal. It can be very annoying and may have occasional stronger flare-ups. With discipline, patients may get used to it and adapt.   Moderate pain 3 Interferes significantly with activities of daily living (ADL). It becomes difficult to feed, bathe, get dressed, get on and off the toilet or to perform personal hygiene functions. Difficult to get in and out of bed or a chair without assistance. Very distracting. With effort, it can be ignored when deeply involved in activities.   Moderately severe pain 4 Impossible to ignore for more than a few minutes. With effort, patients may still be able to manage work or participate in some social activities. Very difficult to concentrate. Signs of autonomic nervous system discharge are evident: dilated pupils (mydriasis); mild sweating (diaphoresis); sleep interference. Heart rate becomes elevated (>115 bpm). Diastolic blood pressure (lower number) rises above 100 mmHg. Patients find relief in laying down and not moving.   Severe pain 5 Intense and extremely unpleasant. Associated with frowning face and frequent crying. Pain overwhelms the senses.  Ability to do any activity or maintain social relationships becomes significantly limited. Conversation becomes difficult. Pacing back and forth is common, as getting into a comfortable position is nearly impossible. Pain wakes you up from deep sleep. Physical signs will be obvious: pupillary dilation; increased sweating; goosebumps; brisk reflexes; cold, clammy hands and feet; nausea, vomiting or dry heaves; loss of appetite; significant  sleep disturbance with inability to fall asleep or to remain asleep. When persistent, significant weight loss is observed due to the complete loss of appetite and sleep deprivation.  Blood pressure and heart rate becomes significantly elevated. Caution: If elevated blood pressure triggers a pounding headache associated with blurred vision, then the patient should immediately seek  attention at an urgent or emergency care unit, as these may be signs of an impending stroke.    Emergency Department Pain Levels (6-10/10)  Emergency Room Pain 6 Severely limiting. Requires emergency care and should not be seen or managed at an outpatient pain management facility. Communication becomes difficult and requires great effort. Assistance to reach the emergency department may be required. Facial flushing and profuse sweating along with potentially dangerous increases in heart rate and blood pressure will be evident.   Distressing pain 7 Self-care is very difficult. Assistance is required to transport, or use restroom. Assistance to reach the emergency department will be required. Tasks requiring coordination, such as bathing and getting dressed become very difficult.   Disabling pain 8 Self-care is no longer possible. At this level, pain is disabling. The individual is unable to do even the most "basic" activities such as walking, eating, bathing, dressing, transferring to a bed, or toileting. Fine motor skills are lost. It is difficult to think clearly.   Incapacitating pain 9 Pain becomes incapacitating. Thought processing is no longer possible. Difficult to remember your own name. Control of movement and coordination are lost.   The worst pain imaginable 10 At this level, most patients pass out from pain. When this level is reached, collapse of the autonomic nervous system occurs, leading to a sudden drop in blood pressure and heart rate. This in turn results in a temporary and dramatic drop in blood flow to the brain, leading to a loss of consciousness. Fainting is one of the body's self defense mechanisms. Passing out puts the brain in a calmed state and causes it to shut down for a while, in order to begin the healing process.    Summary: 1. Refer to this scale when providing [place] with your pain level. 2. Be accurate and careful when reporting your pain level. This will help with your  care. 3. Over-reporting your pain level will lead to loss of credibility. 4. Even a level of 1/10 means that there is pain and will be treated at our facility. 5. High, inaccurate reporting will be documented as "Symptom Exaggeration", leading to loss of credibility and suspicions of possible secondary gains such as obtaining more narcotics, or wanting to appear disabled, for fraudulent reasons. 6. Only pain levels of 5 or below will be seen at our facility. 7. Pain levels of 6 and above will be sent to the Emergency Department and the appointment cancelled. ______________________________________________________________________________________________

## 2018-07-10 NOTE — Progress Notes (Signed)
Patient's Name: Steve Gregory  MRN: [id number]  Referring Provider: Kirk Ruths, MD  DOB: Jan 03, 1956  PCP: Kirk Ruths, MD  DOS: 07/10/2018  Note by: Vevelyn Francois NP  Service setting: Ambulatory outpatient  Specialty: Interventional Pain Management  Location: [facility] (AMB) Pain Management Facility    Patient type: Established    Primary Reason(s) for Visit: Encounter for prescription drug management. (Level of risk: moderate)  CC: Back Pain  HPI  Steve Gregory is a 62 y.o. year old, male patient, who comes today for a medication management evaluation. He has Lower extremity pain, bilateral; Varicose veins of both lower extremities with pain; HTN (hypertension), benign; Tobacco dependence; COPD (chronic obstructive pulmonary disease) with emphysema ([place]); GERD (gastroesophageal reflux disease); Healthcare maintenance; History of depression; Hyperglycemia; PVD (peripheral vascular disease) ([place]); Lung mass; Lumbosacral radiculopathy at S1; Chronic bilateral low back pain with bilateral sciatica; Lumbar degenerative disc disease; Cervicalgia; Spinal stenosis of lumbar region with neurogenic claudication; Pharmacologic therapy; Disorder of skeletal system; Problems influencing health status; Vitamin D insufficiency; Neurogenic pain; and Chronic pain syndrome on their problem list. His primarily concern today is the Back Pain  Pain Assessment: Location:   Back Radiating: pain radiaties everywhere Onset: More than a month ago Duration: Chronic pain Quality: Throbbing, Stabbing, Aching, Burning, Discomfort Severity: 7 /10 (subjective, self-reported pain score)  Note: Reported level is compatible with observation. Clinically the patient looks like a 1/10 A 1/10 is viewed as "Mild" and described as nagging, annoying, but not interfering with basic activities of daily living (ADL). Steve Gregory is able to eat, bathe, get dressed, do toileting (being able to get on and off the toilet and perform  personal hygiene functions), transfer (move in and out of bed or a chair without assistance), and maintain continence (able to control bladder and bowel functions). Physiologic parameters such as blood pressure and heart rate apear wnl. Steve Gregory does not seem to understand the use of our objective pain scale When using our objective Pain Scale, levels between 6 and 10/10 are said to belong in an emergency room, as it progressively worsens from a 6/10, described as severely limiting, requiring emergency care not usually available at an outpatient pain management facility. At a 6/10 level, communication becomes difficult and requires great effort. Assistance to reach the emergency department may be required. Facial flushing and profuse sweating along with potentially dangerous increases in heart rate and blood pressure will be evident. Effect on ADL: my daily activities are very limited Timing: Constant Modifying factors: nothing BP: (!) 153/85  HR: 92  Steve Gregory was last scheduled for an appointment on 04/11/2018 for medication management. During today's appointment we reviewed Steve Gregory chronic pain status, as well as his outpatient medication regimen. He has numbness and tingling.Marland Kitchen He has weakness in his legs and arms.  He admits that it also has pain in his feet and his fingers.  He does not feel like the gabapentin is working so he has continued this.  He has failed muscle relaxers because they make him to relax.  He has failed over-the-counter medications.  He does take a daily dose of steroids.  He admits that he has not Lyrica.  He admits that he has finished his radiation and will follow-up with oncology in a few weeks.  The patient  reports that he does not use drugs. His body mass index is 24.81 kg/m.  Further details on both, my assessment(s), as well as the proposed treatment plan, please  see below.  Laboratory Chemistry  Inflammation Markers (CRP: Acute Phase) (ESR: Chronic Phase) Lab  Results  Component Value Date   CRP 10 04/11/2018   ESRSEDRATE 15 04/11/2018                         Rheumatology Markers No results found for: RF, ANA, LABURIC, URICUR, LYMEIGGIGMAB, LYMEABIGMQN, HLAB27                      Renal Function Markers Lab Results  Component Value Date   BUN 12 04/03/2017   CREATININE 0.89 04/03/2017   GFRAA >60 04/03/2017   GFRNONAA >60 04/03/2017                             Hepatic Function Markers Lab Results  Component Value Date   AST 15 04/03/2017   ALT 8 (L) 04/03/2017   ALBUMIN 3.8 04/03/2017   ALKPHOS 77 04/03/2017   LIPASE 232 01/31/2013                        Electrolytes Lab Results  Component Value Date   NA 140 04/03/2017   K 4.0 04/03/2017   CL 106 04/03/2017   CALCIUM 9.3 04/03/2017   MG 2.4 (H) 04/11/2018                        Neuropathy Markers Lab Results  Component Value Date   VITAMINB12 428 04/11/2018                        CNS Tests No results found for: COLORCSF, APPEARCSF, RBCCOUNTCSF, WBCCSF, POLYSCSF, LYMPHSCSF, EOSCSF, PROTEINCSF, GLUCCSF, JCVIRUS, CSFOLI, IGGCSF                      Bone Pathology Markers Lab Results  Component Value Date   25OHVITD1 21 (L) 04/11/2018   25OHVITD2 <1.0 04/11/2018   25OHVITD3 21 04/11/2018                         Coagulation Parameters Lab Results  Component Value Date   INR 1.2 01/31/2014   LABPROT 14.9 (H) 01/31/2014   PLT 261 04/03/2017                        Cardiovascular Markers Lab Results  Component Value Date   BNP 6,953 (H) 01/31/2014   TROPONINI 0.04 01/31/2014   HGB 14.7 04/03/2017   HCT 44.1 04/03/2017                         CA Markers No results found for: CEA, CA125, LABCA2                      Note: Lab results reviewed.  Recent Diagnostic Imaging Results  DG UGI  W/KUB CLINICAL DATA:  Abdominal and chest pain.  History of lung carcinoma  EXAM: UPPER GI SERIES WITH KUB  TECHNIQUE: After obtaining a scout radiograph a routine  upper GI series was performed using thin density barium.  FLUOROSCOPY TIME:  Fluoroscopy Time:  1 minutes 30 seconds  Radiation Exposure Index (if provided by the fluoroscopic device): 152.50 mGy  Number of Acquired Spot Images: 21  COMPARISON:  CT  chest and upper abdomen February 13, 2018  FINDINGS: Preliminary abdomen radiograph shows a normal gas pattern.  Esophagus, stomach, and duodenum are visualized. Swallowing and peristalsis appear normal. There is a minimal hiatal hernia with reflux of a full, barium to the upper esophagus which is slowed empty. There is no esophageal stricture, mass, or ulceration. Pharynx appears normal except for hypertrophy of the cricopharyngeus muscle.  There is generalized wall thickening throughout the stomach consistent with gastritis. No gastric mass or ulceration is evident.  There is mild fold thickening in the proximal duodenum consistent with a degree of duodenitis. No evident duodenal mass or ulceration. Proximal jejunum appears normal.  IMPRESSION: 1.  Moderate generalized gastritis.  No gastric mass or ulceration.  2.  Milder proximal duodenitis.  No duodenal mass or ulceration.  3. Rather minimal hiatal hernia with reflux of a full column of barium to the upper esophagus which is slowed empty. No esophageal stricture, mass, or ulceration.  4.  Hypertrophy of the cricopharyngeus muscle.  Electronically Signed   By: Lowella Grip III M.D.   On: 04/12/2018 14:16  Complexity Note: Imaging results reviewed. Results shared with Steve Gregory, using Layman's terms.                         Meds   Current Outpatient Medications:  .  albuterol (PROVENTIL) (2.5 MG/3ML) 0.083% nebulizer solution, Take 2.5 mg by nebulization every 6 (six) hours as needed for wheezing or shortness of breath., Disp: , Rfl:  .  aspirin EC 81 MG tablet, Take by mouth., Disp: , Rfl:  .  fluticasone (FLONASE) 50 MCG/ACT nasal spray, Place 2 sprays into both  nostrils daily., Disp: , Rfl:  .  Fluticasone-Salmeterol (ADVAIR) 250-50 MCG/DOSE AEPB, Inhale 1 puff into the lungs 2 (two) times daily., Disp: , Rfl:  .  ipratropium (ATROVENT HFA) 17 MCG/ACT inhaler, Inhale into the lungs 3 (three) times daily., Disp: , Rfl:  .  ipratropium-albuterol (DUONEB) 0.5-2.5 (3) MG/3ML SOLN, Take 3 mLs by nebulization., Disp: , Rfl:  .  losartan (COZAAR) 25 MG tablet, Take 25 mg by mouth daily., Disp: , Rfl:  .  montelukast (SINGULAIR) 10 MG tablet, Take 10 mg by mouth every morning., Disp: , Rfl:  .  pantoprazole (PROTONIX) 40 MG tablet, Take 40 mg by mouth 2 (two) times daily. , Disp: , Rfl:  .  predniSONE (DELTASONE) 10 MG tablet, Take 15 mg by mouth daily with breakfast., Disp: , Rfl:  .  theophylline (UNIPHYL) 400 MG 24 hr tablet, Take 400 mg by mouth daily., Disp: , Rfl:  .  pregabalin (LYRICA) 50 MG capsule, Take 1 capsule (50 mg total) by mouth 3 (three) times daily., Disp: 90 capsule, Rfl: 0 .  sucralfate (CARAFATE) 1 g tablet, Take by mouth., Disp: , Rfl:   ROS  Constitutional: Denies any fever or chills Gastrointestinal: No reported hemesis, hematochezia, vomiting, or acute GI distress Musculoskeletal: Denies any acute onset joint swelling, redness, loss of ROM, or weakness Neurological: No reported episodes of acute onset apraxia, aphasia, dysarthria, agnosia, amnesia, paralysis, loss of coordination, or loss of consciousness  Allergies  Steve Gregory is allergic to chantix [varenicline]; lipitor [atorvastatin]; mobic [meloxicam]; and statins.  PFSH  Drug: Steve Gregory  reports that he does not use drugs. Alcohol:  reports that he does not drink alcohol. Tobacco:  reports that he has been smoking. He has a 21.50 pack-year smoking history. He has never used smokeless tobacco.  Medical:  has a past medical history of Allergy, Anxiety, Arthritis, Bronchitis, chronic ([place]), Cancer ([place]), COPD (chronic obstructive pulmonary disease) ([place]), Cough, Depression,  Dizziness, Essential hypertension (06/28/2017), GERD (gastroesophageal reflux disease), H/O emphysema, Headache, History of depression (06/05/2014), History of hiatal hernia, HOH (hard of hearing), Hypertension, Lung mass, Orthopnea, Oxygen decrease, Pneumonia, PVD (peripheral vascular disease) ([place]) (06/05/2014), Seizures ([place]), Shortness of breath dyspnea, Varicose veins of both lower extremities with pain (06/28/2017), and Wheezing. Surgical: Steve Gregory  has a past surgical history that includes Facial reconstruction surgery (1980); Cataract extraction w/PHACO (Right, 10/27/2015); Eye surgery; Cataract extraction w/PHACO (Left, 11/17/2015); Esophagogastroduodenoscopy (egd) with propofol (N/A, 06/06/2017); and Colonoscopy with propofol (N/A, 07/28/2017). Family: family history includes Aneurysm in his father; Heart disease in his brother; Hyperlipidemia in his mother; Hypertension in his brother; Stroke in his father.  Constitutional Exam  General appearance: Well nourished, well developed, and well hydrated. In no apparent acute distress Vitals:   07/10/18 1139  BP: (!) 153/85  Pulse: 92  Temp: 97.6 F (36.4 C)  SpO2: 96%  Weight: 168 lb (76.2 kg)  Height: _0  (1.753 m)   BMI Assessment: Estimated body mass index is 24.81 kg/m as calculated from the following:   Height as of this encounter: _1  (1.753 m).   Weight as of this encounter: 168 lb (76.2 kg). Psych/Mental status: Alert, oriented x 3 (person, place, & time)       Eyes: PERLA Respiratory: No evidence of acute respiratory distress  Cervical Spine Area Exam  Skin & Axial Inspection: No masses, redness, edema, swelling, or associated skin lesions Alignment: Symmetrical Functional ROM: Unrestricted ROM      Stability: No instability detected Muscle Tone/Strength: Functionally intact. No obvious neuro-muscular anomalies detected. Sensory (Neurological): Unimpaired Palpation: No palpable anomalies              Upper Extremity  (UE) Exam    Side: Right upper extremity  Side: Left upper extremity  Skin & Extremity Inspection: Skin color, temperature, and hair growth are WNL. No peripheral edema or cyanosis. No masses, redness, swelling, asymmetry, or associated skin lesions. No contractures.  Skin & Extremity Inspection: Skin color, temperature, and hair growth are WNL. No peripheral edema or cyanosis. No masses, redness, swelling, asymmetry, or associated skin lesions. No contractures.  Functional ROM: Unrestricted ROM          Functional ROM: Unrestricted ROM          Muscle Tone/Strength: Functionally intact. No obvious neuro-muscular anomalies detected.  Muscle Tone/Strength: Functionally intact. No obvious neuro-muscular anomalies detected.  Sensory (Neurological): Unimpaired          Sensory (Neurological): Unimpaired          Palpation: No palpable anomalies              Palpation: No palpable anomalies              Provocative Test(s):  Phalen's test: deferred Tinel's test: deferred Apley's scratch test (touch opposite shoulder):  Action 1 (Across chest): deferred Action 2 (Overhead): deferred Action 3 (LB reach): deferred   Provocative Test(s):  Phalen's test: deferred Tinel's test: deferred Apley's scratch test (touch opposite shoulder):  Action 1 (Across chest): deferred Action 2 (Overhead): deferred Action 3 (LB reach): deferred    Thoracic Spine Area Exam  Skin & Axial Inspection: No masses, redness, or swelling Alignment: Symmetrical Functional ROM: Unrestricted ROM Stability: No instability detected Muscle Tone/Strength: Functionally intact. No obvious neuro-muscular anomalies detected. Sensory (Neurological): Unimpaired  Muscle strength & Tone: No palpable anomalies  Lumbar Spine Area Exam  Skin & Axial Inspection: No masses, redness, or swelling Alignment: Symmetrical Functional ROM: Unrestricted ROM       Stability: No instability detected Muscle Tone/Strength: Functionally intact. No  obvious neuro-muscular anomalies detected. Sensory (Neurological): Unimpaired Palpation: No palpable anomalies       Provocative Tests: Hyperextension/rotation test: deferred today       Lumbar quadrant test (Kemp's test): deferred today       Lateral bending test: deferred today       Patrick's Maneuver: deferred today                   FABER test: deferred today                   S-I anterior distraction/compression test: deferred today         S-I lateral compression test: deferred today         S-I Thigh-thrust test: deferred today         S-I Gaenslen's test: deferred today          Gait & Posture Assessment  Ambulation: Unassisted Gait: Relatively normal for age and body habitus Posture: WNL   Lower Extremity Exam    Side: Right lower extremity  Side: Left lower extremity  Stability: No instability observed          Stability: No instability observed          Skin & Extremity Inspection: Skin color, temperature, and hair growth are WNL. No peripheral edema or cyanosis. No masses, redness, swelling, asymmetry, or associated skin lesions. No contractures.  Skin & Extremity Inspection: Skin color, temperature, and hair growth are WNL. No peripheral edema or cyanosis. No masses, redness, swelling, asymmetry, or associated skin lesions. No contractures.  Functional ROM: Unrestricted ROM                  Functional ROM: Unrestricted ROM                  Muscle Tone/Strength: Functionally intact. No obvious neuro-muscular anomalies detected.  Muscle Tone/Strength: Functionally intact. No obvious neuro-muscular anomalies detected.  Sensory (Neurological): Unimpaired  Sensory (Neurological): Unimpaired  Palpation: No palpable anomalies  Palpation: No palpable anomalies   Assessment  Primary Diagnosis & Pertinent Problem List: The primary encounter diagnosis was Chronic bilateral low back pain with bilateral sciatica. Diagnoses of Neurogenic pain, Lower extremity pain, bilateral, and  Chronic pain syndrome were also pertinent to this visit.  Status Diagnosis  Controlled Controlled Controlled 1. Chronic bilateral low back pain with bilateral sciatica   2. Neurogenic pain   3. Lower extremity pain, bilateral   4. Chronic pain syndrome     Problems updated and reviewed during this visit: Problem  Neurogenic Pain  Chronic Pain Syndrome  Chronic Bilateral Low Back Pain With Bilateral Sciatica  Hyperglycemia   Last Assessment & Plan:  Glucose is controlled and followed  Last Assessment & Plan:  Glucose is controlled and followed    Plan of Care  Pharmacotherapy (Medications Ordered): Meds ordered this encounter  Medications  . pregabalin (LYRICA) 50 MG capsule    Sig: Take 1 capsule (50 mg total) by mouth 3 (three) times daily.    Dispense:  90 capsule    Refill:  0    Order Specific Question:   Supervising Provider    Answer:   Gillis Santa [[id number]]   New Prescriptions  PREGABALIN (LYRICA) 50 MG CAPSULE    Take 1 capsule (50 mg total) by mouth 3 (three) times daily.   Medications administered today: Ledell L. Blasco had no medications administered during this visit. Lab-work, procedure(s), and/or referral(s): No orders of the defined types were placed in this encounter.  Imaging and/or referral(s): None  Interventional therapies: Planned, scheduled, and/or pending:   Not at this time.   Provider-requested follow-up: Return in about 4 weeks (around 08/07/2018) for MedMgmt.  Future Appointments  Date Time Provider [place]  08/02/2018  1:00 PM [facility]  08/07/2018  2:30 PM Noreene Filbert, MD [facility] None  08/08/2018  1:45 PM Vevelyn Francois, NP [facility] None   Primary Care Physician: Kirk Ruths, MD Location: [facility] Note by: Vevelyn Francois NP Date: 07/10/2018; Time: 3:57 PM  Pain Score Disclaimer: We use the NRS-11 scale. This is a self-reported, subjective measurement of  pain severity with only modest accuracy. It is used primarily to identify changes within a particular patient. It must be understood that outpatient pain scales are significantly less accurate that those used for research, where they can be applied under ideal controlled circumstances with minimal exposure to variables. In reality, the score is likely to be a combination of pain intensity and pain affect, where pain affect describes the degree of emotional arousal or changes in action readiness caused by the sensory experience of pain. Factors such as social and work situation, setting, emotional state, anxiety levels, expectation, and prior pain experience may influence pain perception and show large inter-individual differences that may also be affected by time variables.  Patient instructions provided during this appointment: Patient Instructions   Electronic Rx for pregabalin (LYRICA) 50 MG capsule  ______________________________________________________________________________________________  Specialty Pain Scale  Introduction:  There are significant differences in how pain is reported. The word pain usually refers to physical pain, but it is also a common synonym of suffering. The medical community uses a scale from 0 (zero) to 10 (ten) to report pain level. Zero (0) is described as "no pain", while ten (10) is described as "the worse pain you can imagine". The problem with this scale is that physical pain is reported along with suffering. Suffering refers to mental pain, or more often yet it refers to any unpleasant feeling, emotion or aversion associated with the perception of harm or threat of harm. It is the psychological component of pain.  Pain Specialists prefer to separate the two components. The pain scale used by this practice is the Verbal Numerical Rating Scale (VNRS-11). This scale is for the physical pain only. DO NOT INCLUDE how your pain psychologically affects you. This scale is  for adults 86 years of age and older. It has 11 (eleven) levels. The 1st level is 0/10. This means: "right now, I have no pain". In the context of pain management, it also means: "right now, my physical pain is under control with the current therapy".  General Information:  The scale should reflect your current level of pain. Unless you are specifically asked for the level of your worst pain, or your average pain. If you are asked for one of these two, then it should be understood that it is over the past 24 hours.  Levels 1 (one) through 5 (five) are described below, and can be treated as an outpatient. Ambulatory pain management facilities such as ours are more than adequate to treat these levels. Levels 6 (six) through 10 (ten) are  also described below, however, these must be treated as a hospitalized patient. While levels 6 (six) and 7 (seven) may be evaluated at an urgent care facility, levels 8 (eight) through 10 (ten) constitute medical emergencies and as such, they belong in a hospital's emergency department. When having these levels (as described below), do not come to our office. Our facility is not equipped to manage these levels. Go directly to an urgent care facility or an emergency department to be evaluated.  Definitions:  Activities of Daily Living (ADL): Activities of daily living (ADL or ADLs) is a term used in healthcare to refer to people's daily self-care activities. Health professionals often use a person's ability or inability to perform ADLs as a measurement of their functional status, particularly in regard to people post injury, with disabilities and the elderly. There are two ADL levels: Basic and Instrumental. Basic Activities of Daily Living (BADL  or BADLs) consist of self-care tasks that include: Bathing and showering; personal hygiene and grooming (including brushing/combing/styling hair); dressing; Toilet hygiene (getting to the toilet, cleaning oneself, and getting back  up); eating and self-feeding (not including cooking or chewing and swallowing); functional mobility, often referred to as "transferring", as measured by the ability to walk, get in and out of bed, and get into and out of a chair; the broader definition (moving from one place to another while performing activities) is useful for people with different physical abilities who are still able to get around independently. Basic ADLs include the things many people do when they get up in the morning and get ready to go out of the house: get out of bed, go to the toilet, bathe, dress, groom, and eat. On the average, loss of function typically follows a particular order. Hygiene is the first to go, followed by loss of toilet use and locomotion. The last to go is the ability to eat. When there is only one remaining area in which the person is independent, there is a 62.9% chance that it is eating and only a 3.5% chance that it is hygiene. Instrumental Activities of Daily Living (IADL or IADLs) are not necessary for fundamental functioning, but they let an individual live independently in a community. IADL consist of tasks that include: cleaning and maintaining the house; home establishment and maintenance; care of others (including selecting and supervising caregivers); care of pets; child rearing; managing money; managing financials (investments, etc.); meal preparation and cleanup; shopping for groceries and necessities; moving within the community; safety procedures and emergency responses; health management and maintenance (taking prescribed medications); and using the telephone or other form of communication.  Instructions:  Most patients tend to report their pain as a combination of two factors, their physical pain and their psychosocial pain. This last one is also known as "suffering" and it is reflection of how physical pain affects you socially and psychologically. From now on, report them separately.  From this  point on, when asked to report your pain level, report only your physical pain. Use the following table for reference.  Pain Clinic Pain Levels (0-5/10)  Pain Level Score  Description  No Pain 0   Mild pain 1 Nagging, annoying, but does not interfere with basic activities of daily living (ADL). Patients are able to eat, bathe, get dressed, toileting (being able to get on and off the toilet and perform personal hygiene functions), transfer (move in and out of bed or a chair without assistance), and maintain continence (able to control bladder and bowel  functions). Blood pressure and heart rate are unaffected. A normal heart rate for a healthy adult ranges from 60 to 100 bpm (beats per minute).   Mild to moderate pain 2 Noticeable and distracting. Impossible to hide from other people. More frequent flare-ups. Still possible to adapt and function close to normal. It can be very annoying and may have occasional stronger flare-ups. With discipline, patients may get used to it and adapt.   Moderate pain 3 Interferes significantly with activities of daily living (ADL). It becomes difficult to feed, bathe, get dressed, get on and off the toilet or to perform personal hygiene functions. Difficult to get in and out of bed or a chair without assistance. Very distracting. With effort, it can be ignored when deeply involved in activities.   Moderately severe pain 4 Impossible to ignore for more than a few minutes. With effort, patients may still be able to manage work or participate in some social activities. Very difficult to concentrate. Signs of autonomic nervous system discharge are evident: dilated pupils (mydriasis); mild sweating (diaphoresis); sleep interference. Heart rate becomes elevated (>115 bpm). Diastolic blood pressure (lower number) rises above 100 mmHg. Patients find relief in laying down and not moving.   Severe pain 5 Intense and extremely unpleasant. Associated with frowning face and frequent  crying. Pain overwhelms the senses.  Ability to do any activity or maintain social relationships becomes significantly limited. Conversation becomes difficult. Pacing back and forth is common, as getting into a comfortable position is nearly impossible. Pain wakes you up from deep sleep. Physical signs will be obvious: pupillary dilation; increased sweating; goosebumps; brisk reflexes; cold, clammy hands and feet; nausea, vomiting or dry heaves; loss of appetite; significant sleep disturbance with inability to fall asleep or to remain asleep. When persistent, significant weight loss is observed due to the complete loss of appetite and sleep deprivation.  Blood pressure and heart rate becomes significantly elevated. Caution: If elevated blood pressure triggers a pounding headache associated with blurred vision, then the patient should immediately seek attention at an urgent or emergency care unit, as these may be signs of an impending stroke.    Emergency Department Pain Levels (6-10/10)  Emergency Room Pain 6 Severely limiting. Requires emergency care and should not be seen or managed at an outpatient pain management facility. Communication becomes difficult and requires great effort. Assistance to reach the emergency department may be required. Facial flushing and profuse sweating along with potentially dangerous increases in heart rate and blood pressure will be evident.   Distressing pain 7 Self-care is very difficult. Assistance is required to transport, or use restroom. Assistance to reach the emergency department will be required. Tasks requiring coordination, such as bathing and getting dressed become very difficult.   Disabling pain 8 Self-care is no longer possible. At this level, pain is disabling. The individual is unable to do even the most "basic" activities such as walking, eating, bathing, dressing, transferring to a bed, or toileting. Fine motor skills are lost. It is difficult to think  clearly.   Incapacitating pain 9 Pain becomes incapacitating. Thought processing is no longer possible. Difficult to remember your own name. Control of movement and coordination are lost.   The worst pain imaginable 10 At this level, most patients pass out from pain. When this level is reached, collapse of the autonomic nervous system occurs, leading to a sudden drop in blood pressure and heart rate. This in turn results in a temporary and dramatic drop in blood flow  to the brain, leading to a loss of consciousness. Fainting is one of the body's self defense mechanisms. Passing out puts the brain in a calmed state and causes it to shut down for a while, in order to begin the healing process.    Summary: 1. Refer to this scale when providing [place] with your pain level. 2. Be accurate and careful when reporting your pain level. This will help with your care. 3. Over-reporting your pain level will lead to loss of credibility. 4. Even a level of 1/10 means that there is pain and will be treated at our facility. 5. High, inaccurate reporting will be documented as "Symptom Exaggeration", leading to loss of credibility and suspicions of possible secondary gains such as obtaining more narcotics, or wanting to appear disabled, for fraudulent reasons. 6. Only pain levels of 5 or below will be seen at our facility. 7. Pain levels of 6 and above will be sent to the Emergency Department and the appointment cancelled. ______________________________________________________________________________________________

## 2018-08-02 ENCOUNTER — Ambulatory Visit
Admission: RE | Admit: 2018-08-02 | Discharge: 2018-08-02 | Disposition: A | Discharge status: Home / Self Care | Payer: Medicare Other | Source: Ambulatory Visit | Attending: Radiation Oncology | Admitting: Radiation Oncology

## 2018-08-02 DIAGNOSIS — C3412 Malignant neoplasm of upper lobe, left bronchus or lung: Secondary | ICD-10-CM | POA: Insufficient documentation

## 2018-08-02 DIAGNOSIS — R918 Other nonspecific abnormal finding of lung field: Secondary | ICD-10-CM | POA: Diagnosis not present

## 2018-08-02 MED ORDER — IOHEXOL 300 MG/ML  SOLN
75.0000 mL | Freq: Once | INTRAMUSCULAR | Status: AC | PRN
  Administered 2018-08-02: 75 mL via INTRAVENOUS

## 2018-08-07 ENCOUNTER — Other Ambulatory Visit: Payer: Self-pay

## 2018-08-07 ENCOUNTER — Encounter: Payer: Self-pay | Admitting: Radiation Oncology

## 2018-08-07 ENCOUNTER — Ambulatory Visit
Admission: RE | Admit: 2018-08-07 | Discharge: 2018-08-07 | Disposition: A | Discharge status: Home / Self Care | Payer: Medicare Other | Source: Ambulatory Visit | Attending: Radiation Oncology | Admitting: Radiation Oncology

## 2018-08-07 ENCOUNTER — Other Ambulatory Visit: Payer: Self-pay | Admitting: *Deleted

## 2018-08-07 VITALS — BP 137/80 | HR 93 | Temp 96.9°F | Resp 22 | Wt 170.7 lb

## 2018-08-07 DIAGNOSIS — C3412 Malignant neoplasm of upper lobe, left bronchus or lung: Secondary | ICD-10-CM

## 2018-08-07 DIAGNOSIS — K219 Gastro-esophageal reflux disease without esophagitis: Secondary | ICD-10-CM | POA: Diagnosis not present

## 2018-08-07 DIAGNOSIS — F17210 Nicotine dependence, cigarettes, uncomplicated: Secondary | ICD-10-CM | POA: Insufficient documentation

## 2018-08-07 DIAGNOSIS — Z923 Personal history of irradiation: Secondary | ICD-10-CM | POA: Insufficient documentation

## 2018-08-07 NOTE — Progress Notes (Signed)
Radiation Oncology Follow up Note  Name: Steve Gregory   Date:   08/07/2018 MRN:  [id number] DOB: Aug 24, 1956    This 62 y.o. male presents to the clinic today for four-month follow-up status post SB RT to his left upper lobe for primary bronchogenic carcinoma stage I.  REFERRING PROVIDER: Kirk Ruths, MD  HPI: patient is a 62 year old male now out 4 months having completed SB RT to his left upper lobe for a stage I primary bronchogenic carcinoma. He is seen today in routine follow-up and is doing fair. He states he's having significant reflux especially at night takes Protonix 2. He is also completed SB RT to his right upper lobe now out a year. He had a recent CT scan showing.response to therapy in the left upper lobe There is a left suprahilar node which is slightly enlarged and 3 month follow-up CT scan was recommended.Otherwise looks lungs look fine. He does have a mild nonproductive cough.  COMPLICATIONS OF TREATMENT: none  FOLLOW UP COMPLIANCE: keeps appointments   PHYSICAL EXAM:  BP 137/80 (BP Location: Left Arm, Patient Position: Sitting)   Pulse 93   Temp (!) 96.9 F (36.1 C) (Tympanic)   Resp (!) 22   Wt 170 lb 11.9 oz (77.4 kg)   BMI 25.21 kg/m  Patient does have some mild expiratory wheezes bilaterally.Well-developed well-nourished patient in NAD. HEENT reveals PERLA, EOMI, discs not visualized.  Oral cavity is clear. No oral mucosal lesions are identified. Neck is clear without evidence of cervical or supraclavicular adenopathy. Lungs are clear to A&P. Cardiac examination is essentially unremarkable with regular rate and rhythm without murmur rub or thrill. Abdomen is benign with no organomegaly or masses noted. Motor sensory and DTR levels are equal and symmetric in the upper and lower extremities. Cranial nerves II through XII are grossly intact. Proprioception is intact. No peripheral adenopathy or edema is identified. No motor or sensory levels are noted. Crude  visual fields are within normal range.  RADIOLOGY RESULTS: CT scan is reviewed and compatible with the above-stated findings  PLAN: at this time of ordered a 3 month follow-up CT scan with a follow-up appointment with me shortly thereafter. I'm also setting him up with medical oncology for continuity of care. I've also suggested he continues on the Protonix at this point in time. Should he have progression of disease in his chest will make further regulations after repeat CT scan. Patient comprehends my treatment plan well.  I would like to take this opportunity to thank you for allowing me to participate in the care of your patient.Noreene Filbert, MD

## 2018-08-08 ENCOUNTER — Encounter: Payer: Self-pay | Admitting: Nurse Practitioner

## 2018-08-08 ENCOUNTER — Other Ambulatory Visit: Payer: Self-pay

## 2018-08-08 ENCOUNTER — Ambulatory Visit: Payer: Medicare Other | Attending: Nurse Practitioner | Admitting: Nurse Practitioner

## 2018-08-08 VITALS — BP 119/97 | HR 86 | Temp 98.2°F | Resp 16 | Ht 70.0 in | Wt 168.0 lb

## 2018-08-08 DIAGNOSIS — M5136 Other intervertebral disc degeneration, lumbar region: Secondary | ICD-10-CM | POA: Diagnosis not present

## 2018-08-08 DIAGNOSIS — I10 Essential (primary) hypertension: Secondary | ICD-10-CM | POA: Insufficient documentation

## 2018-08-08 DIAGNOSIS — F329 Major depressive disorder, single episode, unspecified: Secondary | ICD-10-CM | POA: Insufficient documentation

## 2018-08-08 DIAGNOSIS — Z8249 Family history of ischemic heart disease and other diseases of the circulatory system: Secondary | ICD-10-CM | POA: Diagnosis not present

## 2018-08-08 DIAGNOSIS — Z7982 Long term (current) use of aspirin: Secondary | ICD-10-CM | POA: Diagnosis not present

## 2018-08-08 DIAGNOSIS — Z79899 Other long term (current) drug therapy: Secondary | ICD-10-CM | POA: Insufficient documentation

## 2018-08-08 DIAGNOSIS — J440 Chronic obstructive pulmonary disease with acute lower respiratory infection: Secondary | ICD-10-CM | POA: Insufficient documentation

## 2018-08-08 DIAGNOSIS — I700 Atherosclerosis of aorta: Secondary | ICD-10-CM | POA: Diagnosis not present

## 2018-08-08 DIAGNOSIS — M792 Neuralgia and neuritis, unspecified: Secondary | ICD-10-CM

## 2018-08-08 DIAGNOSIS — E559 Vitamin D deficiency, unspecified: Secondary | ICD-10-CM | POA: Insufficient documentation

## 2018-08-08 DIAGNOSIS — M545 Low back pain: Secondary | ICD-10-CM | POA: Diagnosis present

## 2018-08-08 DIAGNOSIS — M5441 Lumbago with sciatica, right side: Secondary | ICD-10-CM | POA: Insufficient documentation

## 2018-08-08 DIAGNOSIS — Z888 Allergy status to other drugs, medicaments and biological substances status: Secondary | ICD-10-CM | POA: Diagnosis not present

## 2018-08-08 DIAGNOSIS — F419 Anxiety disorder, unspecified: Secondary | ICD-10-CM | POA: Insufficient documentation

## 2018-08-08 DIAGNOSIS — M48062 Spinal stenosis, lumbar region with neurogenic claudication: Secondary | ICD-10-CM | POA: Insufficient documentation

## 2018-08-08 DIAGNOSIS — F17200 Nicotine dependence, unspecified, uncomplicated: Secondary | ICD-10-CM | POA: Diagnosis not present

## 2018-08-08 DIAGNOSIS — M5417 Radiculopathy, lumbosacral region: Secondary | ICD-10-CM | POA: Insufficient documentation

## 2018-08-08 DIAGNOSIS — M5442 Lumbago with sciatica, left side: Secondary | ICD-10-CM | POA: Insufficient documentation

## 2018-08-08 DIAGNOSIS — G8929 Other chronic pain: Secondary | ICD-10-CM

## 2018-08-08 DIAGNOSIS — K219 Gastro-esophageal reflux disease without esophagitis: Secondary | ICD-10-CM | POA: Insufficient documentation

## 2018-08-08 DIAGNOSIS — I739 Peripheral vascular disease, unspecified: Secondary | ICD-10-CM | POA: Diagnosis not present

## 2018-08-08 DIAGNOSIS — G894 Chronic pain syndrome: Secondary | ICD-10-CM | POA: Diagnosis not present

## 2018-08-08 NOTE — Progress Notes (Signed)
Nursing Pain Medication Assessment:  Safety precautions to be maintained throughout the outpatient stay will include: orient to surroundings, keep bed in low position, maintain call bell within reach at all times, provide assistance with transfer out of bed and ambulation.  Medication Inspection Compliance: Pill count conducted under aseptic conditions, in front of the patient. Neither the pills nor the bottle was removed from the patient's sight at any time. Once count was completed pills were immediately returned to the patient in their original bottle.  Medication: Pregabalin Pill/Patch Count: 9 of 90 pills remain Pill/Patch Appearance: Markings consistent with prescribed medication Bottle Appearance: Standard pharmacy container. Clearly labeled. Filled Date: 45 / 11 / 2019 Last Medication intake:  Today

## 2018-08-08 NOTE — Patient Instructions (Signed)
____________________________________________________________________________________________  Medication Rules  Purpose: To inform patients, and their family members, of our rules and regulations.  Applies to: All patients receiving prescriptions (written or electronic).  Pharmacy of record: Pharmacy where electronic prescriptions will be sent. If written prescriptions are taken to a different pharmacy, please inform the nursing staff. The pharmacy listed in the electronic medical record should be the one where you would like electronic prescriptions to be sent.  Electronic prescriptions: In compliance with the [place] Strengthen Opioid Misuse Prevention (STOP) Act of 2017 (Session Law 2017-74/H243), effective August 30, 2018, all controlled substances must be electronically prescribed. Calling prescriptions to the pharmacy will cease to exist.  Prescription refills: Only during scheduled appointments. Applies to all prescriptions.  NOTE: The following applies primarily to controlled substances (Opioid* Pain Medications).   Patient's responsibilities: 1. Pain Pills: Bring all pain pills to every appointment (except for procedure appointments). 2. Pill Bottles: Bring pills in original pharmacy bottle. Always bring the newest bottle. Bring bottle, even if empty. 3. Medication refills: You are responsible for knowing and keeping track of what medications you take and those you need refilled. The day before your appointment: write a list of all prescriptions that need to be refilled. The day of the appointment: give the list to the admitting nurse. Prescriptions will be written only during appointments. If you forget a medication: it will not be "Called in", "Faxed", or "electronically sent". You will need to get another appointment to get these prescribed. No early refills. Do not call asking to have your prescription filled early. 4. Prescription Accuracy: You are responsible for  carefully inspecting your prescriptions before leaving our office. Have the discharge nurse carefully go over each prescription with you, before taking them home. Make sure that your name is accurately spelled, that your address is correct. Check the name and dose of your medication to make sure it is accurate. Check the number of pills, and the written instructions to make sure they are clear and accurate. Make sure that you are given enough medication to last until your next medication refill appointment. 5. Taking Medication: Take medication as prescribed. When it comes to controlled substances, taking less pills or less frequently than prescribed is permitted and encouraged. Never take more pills than instructed. Never take medication more frequently than prescribed.  6. Inform other Doctors: Always inform, all of your healthcare providers, of all the medications you take. 7. Pain Medication from other Providers: You are not allowed to accept any additional pain medication from any other Doctor or Healthcare provider. There are two exceptions to this rule. (see below) In the event that you require additional pain medication, you are responsible for notifying us, as stated below. 8. Medication Agreement: You are responsible for carefully reading and following our Medication Agreement. This must be signed before receiving any prescriptions from our practice. Safely store a copy of your signed Agreement. Violations to the Agreement will result in no further prescriptions. (Additional copies of our Medication Agreement are available upon request.) 9. Laws, Rules, & Regulations: All patients are expected to follow all Federal and State Laws, Statutes, Rules, & Regulations. Ignorance of the Laws does not constitute a valid excuse. The use of any illegal substances is prohibited. 10. Adopted CDC guidelines & recommendations: Target dosing levels will be at or below 60 MME/day. Use of benzodiazepines** is not  recommended.  Exceptions: There are only two exceptions to the rule of not receiving pain medications from other Healthcare Providers. 1.   Exception #1 (Emergencies): In the event of an emergency (i.e.: accident requiring emergency care), you are allowed to receive additional pain medication. However, you are responsible for: As soon as you are able, call our office [phone], at any time of the day or night, and leave a message stating your name, the date and nature of the emergency, and the name and dose of the medication prescribed. In the event that your call is answered by a member of our staff, make sure to document and save the date, time, and the name of the person that took your information.  2. Exception #2 (Planned Surgery): In the event that you are scheduled by another doctor or dentist to have any type of surgery or procedure, you are allowed (for a period no longer than 30 days), to receive additional pain medication, for the acute post-op pain. However, in this case, you are responsible for picking up a copy of our "Post-op Pain Management for Surgeons" handout, and giving it to your surgeon or dentist. This document is available at our office, and does not require an appointment to obtain it. Simply go to our office during business hours (Monday-Thursday from 8:00 AM to 4:00 PM) (Friday 8:00 AM to 12:00 Noon) or if you have a scheduled appointment with us, prior to your surgery, and ask for it by name. In addition, you will need to provide us with your name, name of your surgeon, type of surgery, and date of procedure or surgery.  *Opioid medications include: morphine, codeine, oxycodone, oxymorphone, hydrocodone, hydromorphone, meperidine, tramadol, tapentadol, buprenorphine, fentanyl, methadone. **Benzodiazepine medications include: diazepam (Valium), alprazolam (Xanax), clonazepam (Klonopine), lorazepam (Ativan), clorazepate (Tranxene), chlordiazepoxide (Librium), estazolam (Prosom),  oxazepam (Serax), temazepam (Restoril), triazolam (Halcion) (Last updated: 10/27/2017) ____________________________________________________________________________________________    

## 2018-08-08 NOTE — Progress Notes (Signed)
Patient's Name: Steve Gregory  MRN: [id number]  Referring Provider: Kirk Ruths, MD  DOB: [birth year]  PCP: Kirk Ruths, MD  DOS: 08/08/2018  Note by: Vevelyn Francois NP  Service setting: Ambulatory outpatient  Specialty: Interventional Pain Management  Location: [facility] (AMB) Pain Management Facility    Patient type: Established    Primary Reason(s) for Visit: Encounter for prescription drug management. (Level of risk: moderate)  CC: Back Pain (lower)  HPI  Steve Gregory is a 62 y.o. year old, male patient, who comes today for a medication management evaluation. He has Lower extremity pain, bilateral; Varicose veins of both lower extremities with pain; HTN (hypertension), benign; Tobacco dependence; COPD (chronic obstructive pulmonary disease) with emphysema ([place]); GERD (gastroesophageal reflux disease); Healthcare maintenance; History of depression; Hyperglycemia; PVD (peripheral vascular disease) ([place]); Lung mass; Lumbosacral radiculopathy at S1; Chronic bilateral low back pain with bilateral sciatica; Lumbar degenerative disc disease; Cervicalgia; Spinal stenosis of lumbar region with neurogenic claudication; Pharmacologic therapy; Disorder of skeletal system; Problems influencing health status; Vitamin D insufficiency; Neurogenic pain; and Chronic pain syndrome on their problem list. His primarily concern today is the Back Pain (lower)  Pain Assessment: Location: Lower, Mid Back Radiating: hips/buttocks down legs to ankle bilateral, left is worse Onset: More than a month ago Duration: Chronic pain Quality: Throbbing, Burning, Discomfort, Aching Severity: 7 /10 (subjective, self-reported pain score)  Note: Reported level is compatible with observation. Clinically the patient looks like a 2/10 A 2/10 is viewed as "Mild to Moderate" and described as noticeable and distracting. Impossible to hide from other people. More frequent flare-ups. Still possible to adapt and function close to  normal. It can be very annoying and may have occasional stronger flare-ups. With discipline, patients may get used to it and adapt.       When using our objective Pain Scale, levels between 6 and 10/10 are said to belong in an emergency room, as it progressively worsens from a 6/10, described as severely limiting, requiring emergency care not usually available at an outpatient pain management facility. At a 6/10 level, communication becomes difficult and requires great effort. Assistance to reach the emergency department may be required. Facial flushing and profuse sweating along with potentially dangerous increases in heart rate and blood pressure will be evident. Effect on ADL: pace self, prolonged walking, bending Timing: Constant Modifying factors: Nothing BP: (!) 119/97  HR: 86  Steve Gregory was last scheduled for an appointment on 07/10/2018 for medication management. During today's appointment we reviewed Steve Gregory chronic pain status, as well as his outpatient medication regimen. He admits that the pain does radiate down into his ankles. He has weakness in his legs. He has to walk short distances and wait for the pain to subside before he can continues.  He has started the Lyrica but does not feel like it is more effective that Gabapentin. He admits that his insurance was charged $500.00 for the Lyrica. He has declined any additional prescription of this or the Gabapentin. He continues with is daily prednisone for his "lungs". He is not using any OTC medication for pain. He admits that he may go to [facility] for further evaluation.   The patient  reports that he does not use drugs. His body mass index is 24.11 kg/m.  Further details on both, my assessment(s), as well as the proposed treatment plan, please see below.  Controlled Substance Pharmacotherapy Assessment REMS (Risk Evaluation and Mitigation Strategy)   Ignatius Specking, RN  08/08/2018  2:14 PM  Sign at close encounter Nursing Pain  Medication Assessment:  Safety precautions to be maintained throughout the outpatient stay will include: orient to surroundings, keep bed in low position, maintain call bell within reach at all times, provide assistance with transfer out of bed and ambulation.  Medication Inspection Compliance: Pill count conducted under aseptic conditions, in front of the patient. Neither the pills nor the bottle was removed from the patient's sight at any time. Once count was completed pills were immediately returned to the patient in their original bottle.  Medication: Pregabalin Pill/Patch Count: 9 of 90 pills remain Pill/Patch Appearance: Markings consistent with prescribed medication Bottle Appearance: Standard pharmacy container. Clearly labeled. Filled Date: 73 / 11 / 2019 Last Medication intake:  Today   Pharmacokinetics: Liberation and absorption (onset of action): WNL Distribution (time to peak effect): WNL Metabolism and excretion (duration of action): WNL         Pharmacodynamics: Desired effects: Analgesia: Mr. Clock reports >50% benefit. Functional ability: Patient reports that medication allows him to accomplish basic ADLs Clinically meaningful improvement in function (CMIF): Sustained CMIF goals met Perceived effectiveness: Described as relatively effective, allowing for increase in activities of daily living (ADL) Undesirable effects: Side-effects or Adverse reactions: None reported Monitoring: [place] PMP: Online review of the past 71-monthperiod conducted. Compliant with practice rules and regulations Last UDS on record: Summary  Date Value Ref Range Status  11/15/2017 FINAL  Final    Comment:    ==================================================================== TOXASSURE COMP DRUG ANALYSIS,UR ==================================================================== Test                             Result       Flag       Units Drug Present and Declared for Prescription Verification    Theophylline                   PRESENT      EXPECTED Drug Present not Declared for Prescription Verification   Carboxy-THC                    76           UNEXPECTED ng/mg creat    Carboxy-THC is a metabolite of tetrahydrocannabinol  (THC).    Source of [facility] most commonly illicit, but THC is also present    in a scheduled prescription medication. Drug Absent but Declared for Prescription Verification   Gabapentin                     Not Detected UNEXPECTED   Tizanidine                     Not Detected UNEXPECTED    Tizanidine, as indicated in the declared medication list, is not    always detected even when used as directed.   Salicylate                     Not Detected UNEXPECTED    Aspirin, as indicated in the declared medication list, is not    always detected even when used as directed. ==================================================================== Test                      Result    Flag   Units      Ref Range   Creatinine  80               mg/dL      >=20 ==================================================================== Declared Medications:  The flagging and interpretation on this report are based on the  following declared medications.  Unexpected results may arise from  inaccuracies in the declared medications.  **Note: The testing scope of this panel includes these medications:  Gabapentin  Theophylline  **Note: The testing scope of this panel does not include small to  moderate amounts of these reported medications:  Aspirin (Aspirin 81)  Tizanidine  **Note: The testing scope of this panel does not include following  reported medications:  Albuterol  Albuterol (Ipratropium-Albuterol)  Celecoxib  Cholecalciferol  Clotrimazole  Fluconazole  Fluticasone  Fluticasone (Advair)  Ipratropium  Ipratropium (Ipratropium-Albuterol)  Losartan (Losartan Potassium)  Meloxicam  Montelukast  Pantoprazole  Potassium  Prednisone  Salmeterol (Advair)   Sucralfate ==================================================================== For clinical consultation, please call [phone]. ====================================================================    UDS interpretation: Non-Compliant Undeclared illicit substance detected Medication Assessment Form: Reviewed. Patient indicates being compliant with therapy Treatment compliance: Not applicable. Initial evaluation Risk Assessment Profile: Aberrant behavior: See prior evaluations. None observed or detected today Comorbid factors increasing risk of overdose: See prior notes. No additional risks detected today Opioid risk tool (ORT) (Total Score): 3 Personal History of Substance Abuse (SUD-Substance use disorder):  Alcohol: Negative  Illegal Drugs: Negative  Rx Drugs: Negative  ORT Risk Level calculation: Low Risk Risk of substance use disorder (SUD): Moderate-to-High Opioid Risk Tool - 08/08/18 1409      Family History of Substance Abuse   Alcohol  Negative    Illegal Drugs  Negative    Rx Drugs  Negative      Personal History of Substance Abuse   Alcohol  Negative    Illegal Drugs  Negative    Rx Drugs  Negative      Age   Age between 69-45 years   No      History of Preadolescent Sexual Abuse   History of Preadolescent Sexual Abuse  Negative or Male      Psychological Disease   Psychological Disease  Positive    ADD  Negative    OCD  Negative    Bipolar  Negative    Depression  Positive      Total Score   Opioid Risk Tool Scoring  3    Opioid Risk Interpretation  Low Risk      ORT Scoring interpretation table:  Score <3 = Low Risk for SUD  Score between 4-7 = Moderate Risk for SUD  Score >8 = High Risk for Opioid Abuse   Risk Mitigation Strategies:  Patient Counseling: Covered Patient-Prescriber Agreement (PPA): Present and active  Notification to other healthcare providers: Done  Pharmacologic Plan: No change in therapy, at this time.              Laboratory Chemistry  Inflammation Markers (CRP: Acute Phase) (ESR: Chronic Phase) Lab Results  Component Value Date   CRP 10 04/11/2018   ESRSEDRATE 15 04/11/2018                         Rheumatology Markers No results found for: RF, ANA, LABURIC, URICUR, LYMEIGGIGMAB, LYMEABIGMQN, HLAB27                      Renal Function Markers Lab Results  Component Value Date   BUN 12 04/03/2017   CREATININE 0.89 04/03/2017  GFRAA >60 04/03/2017   GFRNONAA >60 04/03/2017                             Hepatic Function Markers Lab Results  Component Value Date   AST 15 04/03/2017   ALT 8 (L) 04/03/2017   ALBUMIN 3.8 04/03/2017   ALKPHOS 77 04/03/2017   LIPASE 232 01/31/2013                        Electrolytes Lab Results  Component Value Date   NA 140 04/03/2017   K 4.0 04/03/2017   CL 106 04/03/2017   CALCIUM 9.3 04/03/2017   MG 2.4 (H) 04/11/2018                        Neuropathy Markers Lab Results  Component Value Date   VITAMINB12 428 04/11/2018                        CNS Tests No results found for: COLORCSF, APPEARCSF, RBCCOUNTCSF, WBCCSF, POLYSCSF, LYMPHSCSF, EOSCSF, PROTEINCSF, GLUCCSF, JCVIRUS, CSFOLI, IGGCSF                      Bone Pathology Markers Lab Results  Component Value Date   25OHVITD1 21 (L) 04/11/2018   25OHVITD2 <1.0 04/11/2018   25OHVITD3 21 04/11/2018                         Coagulation Parameters Lab Results  Component Value Date   INR 1.2 01/31/2014   LABPROT 14.9 (H) 01/31/2014   PLT 261 04/03/2017                        Cardiovascular Markers Lab Results  Component Value Date   BNP 6,953 (H) 01/31/2014   TROPONINI 0.04 01/31/2014   HGB 14.7 04/03/2017   HCT 44.1 04/03/2017                         CA Markers No results found for: CEA, CA125, LABCA2                      Note: Lab results reviewed.  Recent Diagnostic Imaging Results  CT CHEST W CONTRAST CLINICAL DATA:  Non-small-cell lung cancer. Primary  radiation therapy. Bilateral upper lobe primaries.  EXAM: CT CHEST WITH CONTRAST  TECHNIQUE: Multidetector CT imaging of the chest was performed during intravenous contrast administration.  CONTRAST:  12m OMNIPAQUE IOHEXOL 300 MG/ML  SOLN  COMPARISON:  02/13/2018  FINDINGS: Cardiovascular: Aortic and branch vessel atherosclerosis. Normal heart size, without pericardial effusion. No central pulmonary embolism, on this non-dedicated study.  Mediastinum/Nodes: No supraclavicular adenopathy. A right hilar node is similar and upper normal size at 11 mm. A left suprahilar node versus less likely central pulmonary nodule measures 9 mm on image 58/2 versus 5 mm on image 65/2 of the prior.  Lungs/Pleura: No pleural fluid.  Moderate centrilobular emphysema.  Lower lobe predominant bronchial wall thickening. Basilar predominant subpleural interstitial thickening is worse on the right and slightly increased.  No residual right upper lobe pulmonary nodule identified at the site of 6 mm nodule on the prior.  Right lower lobe pulmonary nodule of 5 mm on image 106/3, similar on the prior and unchanged to 09/28/2016, favoring  a benign etiology.  Decrease in irregular subpleural left upper lobe pulmonary nodule. Example 1.3 x 1.4 cm on image 39/3. Compare maximally 2.2 x 2.0 cm on the prior.  Upper Abdomen: Scattered low-density liver lesions are favored to represent cysts. Technically too small to characterize. Normal imaged portions of the spleen, pancreas, gallbladder, kidneys. Left greater than right adrenal nodularity is mild and similar. Similar appearance of the proximal stomach, with underdistention and apparent greater curvature wall thickening.  Musculoskeletal: No acute osseous abnormality.  IMPRESSION: 1. Response to therapy of left upper lobe pulmonary nodule. 2. Left suprahilar node versus less likely central left upper lobe pulmonary nodule has enlarged in the  interval. Suspicious for nodal metastasis. Consider short-term follow-up chest CT at 3 months versus further evaluation with PET. 3. No residual nodule at the right upper lobe site of treated malignancy. 4. Aortic atherosclerosis (ICD10-I70.0), coronary artery atherosclerosis and emphysema (ICD10-J43.9). 5. Suspect interstitial lung disease as evidenced by subpleural reticulation at the right greater than left bases. This could be post infectious/inflammatory, or represent nonspecific interstitial pneumonitis. 6. Similar appearance of the stomach, with underdistention and possible greater curvature wall thickening. Correlate with symptoms of gastritis.  Electronically Signed   By: Abigail Miyamoto M.D.   On: 08/03/2018 07:23  Complexity Note: Imaging results reviewed. Results shared with Mr. Gosch, using Layman's terms.                         Meds   Current Outpatient Medications:  .  albuterol (PROVENTIL) (2.5 MG/3ML) 0.083% nebulizer solution, Take 2.5 mg by nebulization every 6 (six) hours as needed for wheezing or shortness of breath., Disp: , Rfl:  .  aspirin EC 81 MG tablet, Take by mouth., Disp: , Rfl:  .  fluticasone (FLONASE) 50 MCG/ACT nasal spray, Place 2 sprays into both nostrils daily., Disp: , Rfl:  .  Fluticasone-Salmeterol (ADVAIR) 250-50 MCG/DOSE AEPB, Inhale 1 puff into the lungs 2 (two) times daily., Disp: , Rfl:  .  ipratropium-albuterol (DUONEB) 0.5-2.5 (3) MG/3ML SOLN, Take 3 mLs by nebulization., Disp: , Rfl:  .  losartan (COZAAR) 25 MG tablet, Take 25 mg by mouth daily., Disp: , Rfl:  .  montelukast (SINGULAIR) 10 MG tablet, Take 10 mg by mouth every morning., Disp: , Rfl:  .  pantoprazole (PROTONIX) 40 MG tablet, Take 40 mg by mouth 2 (two) times daily. , Disp: , Rfl:  .  predniSONE (DELTASONE) 10 MG tablet, Take 15 mg by mouth daily with breakfast., Disp: , Rfl:  .  pregabalin (LYRICA) 50 MG capsule, Take 1 capsule (50 mg total) by mouth 3 (three) times  daily., Disp: 90 capsule, Rfl: 0 .  theophylline (UNIPHYL) 400 MG 24 hr tablet, Take 400 mg by mouth daily., Disp: , Rfl:  .  sucralfate (CARAFATE) 1 g tablet, Take by mouth., Disp: , Rfl:   ROS  Constitutional: Denies any fever or chills Gastrointestinal: No reported hemesis, hematochezia, vomiting, or acute GI distress Musculoskeletal: Denies any acute onset joint swelling, redness, loss of ROM, or weakness Neurological: No reported episodes of acute onset apraxia, aphasia, dysarthria, agnosia, amnesia, paralysis, loss of coordination, or loss of consciousness  Allergies  Steve Gregory is allergic to chantix [varenicline]; lipitor [atorvastatin]; mobic [meloxicam]; and statins.  PFSH  Drug: Steve Gregory  reports that he does not use drugs. Alcohol:  reports that he does not drink alcohol. Tobacco:  reports that he has been smoking. He has a 21.50  pack-year smoking history. He has never used smokeless tobacco. Medical:  has a past medical history of Allergy, Anxiety, Arthritis, Bronchitis, chronic ([place]), Cancer ([place]), COPD (chronic obstructive pulmonary disease) ([place]), Cough, Depression, Dizziness, Essential hypertension (06/28/2017), GERD (gastroesophageal reflux disease), H/O emphysema, Headache, History of depression (06/05/2014), History of hiatal hernia, HOH (hard of hearing), Hypertension, Lung mass, Orthopnea, Oxygen decrease, Pneumonia, PVD (peripheral vascular disease) ([place]) (06/05/2014), Seizures ([place]), Shortness of breath dyspnea, Varicose veins of both lower extremities with pain (06/28/2017), and Wheezing. Surgical: Steve Gregory  has a past surgical history that includes Facial reconstruction surgery (1980); Cataract extraction w/PHACO (Right, 10/27/2015); Eye surgery; Cataract extraction w/PHACO (Left, 11/17/2015); Esophagogastroduodenoscopy (egd) with propofol (N/A, 06/06/2017); and Colonoscopy with propofol (N/A, 07/28/2017). Family: family history includes Aneurysm in his father; Heart disease  in his brother; Hyperlipidemia in his mother; Hypertension in his brother; Stroke in his father.  Constitutional Exam  General appearance: Well nourished, well developed, and well hydrated. In no apparent acute distress Vitals:   08/08/18 1402  BP: (!) 119/97  Pulse: 86  Resp: 16  Temp: 98.2 F (36.8 C)  SpO2: 93%  Weight: 168 lb (76.2 kg)  Height: '5\' 10"'$  (1.778 m)  Psych/Mental status: Alert, oriented x 3 (person, place, & time)       Eyes: PERLA Respiratory: No evidence of acute respiratory distress  Cervical Spine Area Exam  Skin & Axial Inspection: No masses, redness, edema, swelling, or associated skin lesions Alignment: Symmetrical Functional ROM: Unrestricted ROM      Stability: No instability detected Muscle Tone/Strength: Functionally intact. No obvious neuro-muscular anomalies detected. Sensory (Neurological): Unimpaired Palpation: No palpable anomalies              Upper Extremity (UE) Exam    Side: Right upper extremity  Side: Left upper extremity  Skin & Extremity Inspection: Skin color, temperature, and hair growth are WNL. No peripheral edema or cyanosis. No masses, redness, swelling, asymmetry, or associated skin lesions. No contractures.  Skin & Extremity Inspection: Skin color, temperature, and hair growth are WNL. No peripheral edema or cyanosis. No masses, redness, swelling, asymmetry, or associated skin lesions. No contractures.  Functional ROM: Adequate ROM          Functional ROM: Unrestricted ROM          Muscle Tone/Strength: Functionally intact. No obvious neuro-muscular anomalies detected.  Muscle Tone/Strength: Functionally intact. No obvious neuro-muscular anomalies detected.  Sensory (Neurological): No anomaly detected          Sensory (Neurological): Unimpaired          Palpation: No palpable anomalies              Palpation: No palpable anomalies                   Thoracic Spine Area Exam  Skin & Axial Inspection: No masses, redness, or  swelling Alignment: Symmetrical Functional ROM: Unrestricted ROM Stability: No instability detected Muscle Tone/Strength: Functionally intact. No obvious neuro-muscular anomalies detected. Sensory (Neurological): Unimpaired Muscle strength & Tone: No palpable anomalies  Lumbar Spine Area Exam  Skin & Axial Inspection: No masses, redness, or swelling Alignment: Symmetrical Functional ROM: Unrestricted ROM       Stability: No instability detected Muscle Tone/Strength: Functionally intact. No obvious neuro-muscular anomalies detected. Sensory (Neurological): Unimpaired Palpation: No palpable anomalies        Gait & Posture Assessment  Ambulation: Limited Gait: Antalgic Posture: Antalgic   Lower Extremity Exam    Side: Right lower  extremity  Side: Left lower extremity  Stability: No instability observed          Stability: No instability observed          Skin & Extremity Inspection: Skin color, temperature, and hair growth are WNL. No peripheral edema or cyanosis. No masses, redness, swelling, asymmetry, or associated skin lesions. No contractures.  Skin & Extremity Inspection: Skin color, temperature, and hair growth are WNL. No peripheral edema or cyanosis. No masses, redness, swelling, asymmetry, or associated skin lesions. No contractures.  Functional ROM: Adequate ROM                  Functional ROM: Adequate ROM                  Muscle Tone/Strength: Functionally intact. No obvious neuro-muscular anomalies detected.  Muscle Tone/Strength: Functionally intact. No obvious neuro-muscular anomalies detected.  Sensory (Neurological): Dermatomal pain pattern        Sensory (Neurological): Dermatomal pain pattern         Assessment  Primary Diagnosis & Pertinent Problem List: The primary encounter diagnosis was Chronic bilateral low back pain with bilateral sciatica. Diagnoses of Lumbar degenerative disc disease, Neurogenic pain, PVD (peripheral vascular disease) ([place]), and Chronic pain  syndrome were also pertinent to this visit.  Status Diagnosis  Controlled Controlled Controlled 1. Chronic bilateral low back pain with bilateral sciatica   2. Lumbar degenerative disc disease   3. Neurogenic pain   4. PVD (peripheral vascular disease) ([place])   5. Chronic pain syndrome     Problems updated and reviewed during this visit: Problem  Pvd (Peripheral Vascular Disease) ([facility])   Overview:  Smoker with poor pulses  Last Assessment & Plan:  No leg pain is noted of late.   Smoker with poor pulses  Last Assessment & Plan:  No real claudication  Smoker with poor pulses  Last Assessment & Plan:  No leg pain is noted at this point    Plan of Care  Pharmacotherapy (Medications Ordered): No orders of the defined types were placed in this encounter.  New Prescriptions   No medications on file   Medications administered today: Lenus L. Tavella had no medications administered during this visit. Lab-work, procedure(s), and/or referral(s): No orders of the defined types were placed in this encounter.  Imaging and/or referral(s): None  Interventional therapies: Planned, scheduled, and/or pending:   Not at this time. Declined any procedures.   Provider-requested follow-up: Return if symptoms worsen or fail to improve.  Future Appointments  Date Time Provider [place]  12/07/2018  2:30 PM Noreene Filbert, MD [facility] None   Primary Care Physician: Kirk Ruths, MD Location: [facility] Outpatient Pain Management Facility Note by: Vevelyn Francois NP Date: 08/08/2018; Time: 2:41 PM  Pain Score Disclaimer: We use the NRS-11 scale. This is a self-reported, subjective measurement of pain severity with only modest accuracy. It is used primarily to identify changes within a particular patient. It must be understood that outpatient pain scales are significantly less accurate that those used for research, where they can be applied under ideal controlled  circumstances with minimal exposure to variables. In reality, the score is likely to be a combination of pain intensity and pain affect, where pain affect describes the degree of emotional arousal or changes in action readiness caused by the sensory experience of pain. Factors such as social and work situation, setting, emotional state, anxiety levels, expectation, and prior pain experience may influence pain perception and  show large inter-individual differences that may also be affected by time variables.  Patient instructions provided during this appointment: Patient Instructions  ____________________________________________________________________________________________  Medication Rules  Purpose: To inform patients, and their family members, of our rules and regulations.  Applies to: All patients receiving prescriptions (written or electronic).  Pharmacy of record: Pharmacy where electronic prescriptions will be sent. If written prescriptions are taken to a different pharmacy, please inform the nursing staff. The pharmacy listed in the electronic medical record should be the one where you would like electronic prescriptions to be sent.  Electronic prescriptions: In compliance with the [place] (STOP) Act of 2017 (Session Lanny Cramp [phone]), effective August 30, 2018, all controlled substances must be electronically prescribed. Calling prescriptions to the pharmacy will cease to exist.  Prescription refills: Only during scheduled appointments. Applies to all prescriptions.  NOTE: The following applies primarily to controlled substances (Opioid* Pain Medications).   Patient's responsibilities: 1. Pain Pills: Bring all pain pills to every appointment (except for procedure appointments). 2. Pill Bottles: Bring pills in original pharmacy bottle. Always bring the newest bottle. Bring bottle, even if empty. 3. Medication refills: You are responsible for  knowing and keeping track of what medications you take and those you need refilled. The day before your appointment: write a list of all prescriptions that need to be refilled. The day of the appointment: give the list to the admitting nurse. Prescriptions will be written only during appointments. If you forget a medication: it will not be "Called in", "Faxed", or "electronically sent". You will need to get another appointment to get these prescribed. No early refills. Do not call asking to have your prescription filled early. 4. Prescription Accuracy: You are responsible for carefully inspecting your prescriptions before leaving our office. Have the discharge nurse carefully go over each prescription with you, before taking them home. Make sure that your name is accurately spelled, that your address is correct. Check the name and dose of your medication to make sure it is accurate. Check the number of pills, and the written instructions to make sure they are clear and accurate. Make sure that you are given enough medication to last until your next medication refill appointment. 5. Taking Medication: Take medication as prescribed. When it comes to controlled substances, taking less pills or less frequently than prescribed is permitted and encouraged. Never take more pills than instructed. Never take medication more frequently than prescribed.  6. Inform other Doctors: Always inform, all of your healthcare providers, of all the medications you take. 7. Pain Medication from other Providers: You are not allowed to accept any additional pain medication from any other Doctor or Healthcare provider. There are two exceptions to this rule. (see below) In the event that you require additional pain medication, you are responsible for notifying us, as stated below. 8. Medication Agreement: You are responsible for carefully reading and following our Medication Agreement. This must be signed before receiving any  prescriptions from our practice. Safely store a copy of your signed Agreement. Violations to the Agreement will result in no further prescriptions. (Additional copies of our Medication Agreement are available upon request.) 9. Laws, Rules, & Regulations: All patients are expected to follow all Federal and [employer], [employer], Rules, [employer]. Ignorance of the Laws does not constitute a valid excuse. The use of any illegal substances is prohibited. 10. Adopted CDC guidelines & recommendations: Target dosing levels will be at or below 60 MME/day. Use of benzodiazepines** is  not recommended.  Exceptions: There are only two exceptions to the rule of not receiving pain medications from other Healthcare Providers. 1. Exception #1 (Emergencies): In the event of an emergency (i.e.: accident requiring emergency care), you are allowed to receive additional pain medication. However, you are responsible for: As soon as you are able, call our office [phone], at any time of the day or night, and leave a message stating your name, the date and nature of the emergency, and the name and dose of the medication prescribed. In the event that your call is answered by a member of our staff, make sure to document and save the date, time, and the name of the person that took your information.  2. Exception #2 (Planned Surgery): In the event that you are scheduled by another doctor or dentist to have any type of surgery or procedure, you are allowed (for a period no longer than 30 days), to receive additional pain medication, for the acute post-op pain. However, in this case, you are responsible for picking up a copy of our "Post-op Pain Management for Surgeons" handout, and giving it to your surgeon or dentist. This document is available at our office, and does not require an appointment to obtain it. Simply go to our office during business hours (Monday-Thursday from 8:00 AM to 4:00 PM) (Friday 8:00 AM to 12:00 Noon) or  if you have a scheduled appointment with [place], prior to your surgery, and ask for it by name. In addition, you will need to provide [place] with your name, name of your surgeon, type of surgery, and date of procedure or surgery.  *Opioid medications include: morphine, codeine, oxycodone, oxymorphone, hydrocodone, hydromorphone, meperidine, tramadol, tapentadol, buprenorphine, fentanyl, methadone. **Benzodiazepine medications include: diazepam (Valium), alprazolam (Xanax), clonazepam (Klonopine), lorazepam (Ativan), clorazepate (Tranxene), chlordiazepoxide (Librium), estazolam (Prosom), oxazepam (Serax), temazepam (Restoril), triazolam (Halcion) (Last updated: 10/27/2017) ____________________________________________________________________________________________

## 2018-08-09 ENCOUNTER — Encounter: Payer: Self-pay | Admitting: *Deleted

## 2018-08-09 DIAGNOSIS — R590 Localized enlarged lymph nodes: Secondary | ICD-10-CM

## 2018-08-09 DIAGNOSIS — R918 Other nonspecific abnormal finding of lung field: Secondary | ICD-10-CM

## 2018-08-09 NOTE — Progress Notes (Signed)
  Oncology Nurse Navigator Documentation  Navigator Location: [facility] (08/09/18 0800) Referral date to RadOnc/MedOnc: 08/07/18 (08/09/18 0800) )Navigator Encounter Type: Introductory phone call (08/09/18 0800)   Abnormal Finding Date: 08/03/18 (08/09/18 0800)                   Treatment Phase: Abnormal Scans (08/09/18 0800) Barriers/Navigation Needs: Coordination of Care (08/09/18 0800)   Interventions: Coordination of Care (08/09/18 0800)   Coordination of Care: Appts;Radiology (08/09/18 0800)        Acuity: Level 2 (08/09/18 0800)   Acuity Level 2: Initial guidance, education and coordination as needed;Educational needs;Assistance expediting appointments (08/09/18 0800)    phone call made to patient to introduce to navigator services. Reviewed upcoming appts with patient. All questions answered during phone call. Contact info and instructed to call with any further questions or needs. Pt verbalized understanding. Time Spent with Patient: 30 (08/09/18 0800)

## 2018-08-14 ENCOUNTER — Ambulatory Visit
Admission: RE | Admit: 2018-08-14 | Discharge: 2018-08-14 | Disposition: A | Discharge status: Home / Self Care | Payer: Medicare Other | Source: Ambulatory Visit | Attending: Oncology | Admitting: Oncology

## 2018-08-14 ENCOUNTER — Other Ambulatory Visit: Payer: Self-pay | Admitting: Hematology and Oncology

## 2018-08-14 DIAGNOSIS — R590 Localized enlarged lymph nodes: Secondary | ICD-10-CM

## 2018-08-14 DIAGNOSIS — I700 Atherosclerosis of aorta: Secondary | ICD-10-CM | POA: Diagnosis not present

## 2018-08-14 DIAGNOSIS — J984 Other disorders of lung: Secondary | ICD-10-CM | POA: Insufficient documentation

## 2018-08-14 DIAGNOSIS — R918 Other nonspecific abnormal finding of lung field: Secondary | ICD-10-CM | POA: Diagnosis not present

## 2018-08-14 LAB — GLUCOSE, CAPILLARY: Glucose-Capillary: 82 mg/dL (ref 70–99)

## 2018-08-14 MED ORDER — FLUDEOXYGLUCOSE F - 18 (FDG) INJECTION
8.7000 | Freq: Once | INTRAVENOUS | Status: AC | PRN
  Administered 2018-08-14: 9.04 via INTRAVENOUS

## 2018-08-17 ENCOUNTER — Other Ambulatory Visit: Payer: Medicare Other

## 2018-08-17 NOTE — Progress Notes (Signed)
Tumor Board Documentation  BOSTON COOKSON was presented by Dr Janese Banks at our Tumor Board on 08/17/2018, which included representatives from medical oncology, radiation oncology, internal medicine, navigation, pathology, radiology, surgical, genetics, nutrition, research, pulmonology.  Beckem currently presents as a new patient, for discussion, for [place] with history of the following treatments: active survellience, adjuvant chemotherapy, adjuvant radiation.  Additionally, we reviewed previous medical and familial history, history of present illness, and recent lab results along with all available histopathologic and imaging studies. The tumor board considered available treatment options and made the following recommendations:   Referral to Pulmonology for possible biopsy then to Radiation Therapy  The following procedures/referrals were also placed: No orders of the defined types were placed in this encounter.   Clinical Trial Status: not discussed   Staging used: AJCC Stage Group History of Stage 1 NSCLC possible recurrence  National site-specific guidelines NCCN were discussed with respect to the case.  Tumor board is a meeting of clinicians from various specialty areas who evaluate and discuss patients for whom a multidisciplinary approach is being considered. Final determinations in the plan of care are those of the provider(s). The responsibility for follow up of recommendations given during tumor board is that of the provider.   Today's extended care, comprehensive team conference, [place] was not present for the discussion and was not examined.   Multidisciplinary Tumor Board is a multidisciplinary case peer review process.  Decisions discussed in the Multidisciplinary Tumor Board reflect the opinions of the specialists present at the conference without having examined the patient.  Ultimately, treatment and diagnostic decisions rest with the primary provider(s) and the patient.

## 2018-08-18 ENCOUNTER — Encounter: Payer: Self-pay | Admitting: *Deleted

## 2018-08-18 ENCOUNTER — Encounter: Payer: Self-pay | Admitting: Pulmonary Disease

## 2018-08-18 ENCOUNTER — Inpatient Hospital Stay: Payer: Medicare Other | Attending: Oncology | Admitting: Oncology

## 2018-08-18 ENCOUNTER — Other Ambulatory Visit: Payer: Self-pay

## 2018-08-18 ENCOUNTER — Ambulatory Visit (INDEPENDENT_AMBULATORY_CARE_PROVIDER_SITE_OTHER): Payer: Medicare Other | Admitting: Pulmonary Disease

## 2018-08-18 VITALS — BP 150/84 | HR 105 | Temp 97.8°F | Ht 69.0 in | Wt 172.7 lb

## 2018-08-18 VITALS — BP 150/84 | HR 105 | Ht 69.0 in | Wt 172.0 lb

## 2018-08-18 DIAGNOSIS — I10 Essential (primary) hypertension: Secondary | ICD-10-CM | POA: Insufficient documentation

## 2018-08-18 DIAGNOSIS — R918 Other nonspecific abnormal finding of lung field: Secondary | ICD-10-CM

## 2018-08-18 DIAGNOSIS — Z7982 Long term (current) use of aspirin: Secondary | ICD-10-CM | POA: Diagnosis not present

## 2018-08-18 DIAGNOSIS — K219 Gastro-esophageal reflux disease without esophagitis: Secondary | ICD-10-CM | POA: Insufficient documentation

## 2018-08-18 DIAGNOSIS — Z79899 Other long term (current) drug therapy: Secondary | ICD-10-CM | POA: Diagnosis not present

## 2018-08-18 DIAGNOSIS — J449 Chronic obstructive pulmonary disease, unspecified: Secondary | ICD-10-CM

## 2018-08-18 DIAGNOSIS — Z7189 Other specified counseling: Secondary | ICD-10-CM

## 2018-08-18 DIAGNOSIS — F17210 Nicotine dependence, cigarettes, uncomplicated: Secondary | ICD-10-CM | POA: Diagnosis not present

## 2018-08-18 DIAGNOSIS — F17200 Nicotine dependence, unspecified, uncomplicated: Secondary | ICD-10-CM

## 2018-08-18 DIAGNOSIS — C3412 Malignant neoplasm of upper lobe, left bronchus or lung: Secondary | ICD-10-CM | POA: Insufficient documentation

## 2018-08-18 NOTE — Progress Notes (Signed)
  Oncology Nurse Navigator Documentation  Navigator Location: [facility] (08/18/18 1000)   )Navigator Encounter Type: Clinic/MDC (08/18/18 1000)               Multidisiplinary Clinic Date: 08/18/18 (08/18/18 1000) Multidisiplinary Clinic Type: Thoracic (08/18/18 1000)   Patient Visit Type: MedOnc;Other (08/18/18 1000) Treatment Phase: Abnormal Scans (08/18/18 1000) Barriers/Navigation Needs: Coordination of Care (08/18/18 1000)   Interventions: Coordination of Care (08/18/18 1000)   Coordination of Care: Appts (08/18/18 1000)       met with patient during initial consultation with Dr. Janese Banks and Dr. Patsey Berthold in the [facility] [place] this morning. All questions answered during visit. Recommendation was to have patient proceed with bronchoscopy to obtain tissue diagnosis. After discussion with providers, pt refused biopsy and states only wants radiation at this time. Informed pt that will discuss with Dr. Baruch Gouty to further plan his follow up and/or treatment. Message sent to Dr. Baruch Gouty for further guidance as to how to schedule pt with him for follow up. Contact info given to patient and instructed to call with any further questions or needs. Informed pt that our office will notify him with his next appointment. Pt verbalized understanding. Nothing further needed at this time.            Time Spent with Patient: 60 (08/18/18 1000)

## 2018-08-18 NOTE — Progress Notes (Signed)
Subjective:    Patient ID: Steve Gregory, male    DOB: 1955-11-03, 62 y.o.   MRN: [id number]  HPI Patient is a 62 year old current smoker we are seeing in the multidisciplinary oncology clinic due to an enlarging left suprahilar process.  This process is FDG avid.  He is usually followed by Dr. Wallene Huh with regards to his pulmonary issues.  He saw Dr. Raul Del 3 weeks ago.  We are asked to evaluate the patient specifically for the question of electromagnetic navigation bronchoscopy (ENB) to main diagnosis regards to the area in question.  His history with regards to this process goes back to January 2018 where he had a CT scan of the chest that showed a small left upper lobe pulmonary nodule.  On repeat CT scan of August 2018 this nodule had increased in size to 1 cm and was suspicious for carcinoma.  PET/CT performed in September 2018 showed hypermetabolic activity in this area.  The patient declined surgical intervention at that time and had radiation to this area.  He has been under surveillance since then.  He had SBRT to the left upper upper lobe that was completed 4 months ago.  On surveillance film obtained on 4 December it was noted that the patient had a left suprahilar node/mass that was enlarging.  He discussed all the findings with the patient.  The patient has complaints of significant gastroesophageal reflux to the point of regurgitating particularly in the mornings.  This is a chronic issue for him.  He is currently on PPI twice a day.  This does not control his symptoms.  Has been having these issues for a year or 2.  Chronic cough in the running which is productive of whitish sputum.  He has not had any hemoptysis.  He is supposed to be on albuterol and Advair HFA however uses only the albuterol on an as-needed basis and rarely uses the Advair because he is "scared of using it".  He is also on theophylline this could be making his reflux worse.  His activities of daily living are  limited due to severe dyspnea.  He unfortunately continues to smoke anywhere between 1/2 to 1 pack of cigarettes per day.  He does not endorse any fevers, chills or sweats.  Past medical history, surgical history, family history have been reviewed.  I discussed with the patient the diagnostic modalities that are available and utilizing EMB to provide a diagnosis this procedure would require general anesthesia.  The patient does not wish to undergo any invasive procedures.  Review of Systems  Constitutional: Positive for fatigue.  Respiratory: Positive for cough and shortness of breath.   Cardiovascular: Negative.   Gastrointestinal:       Severe issues with reflux, regurgitates due to reflux.  Endocrine: Negative.   Genitourinary: Negative.   Musculoskeletal: Negative.   Skin: Positive for rash.       States is to have dermatology evaluation.  Allergic/Immunologic: Negative.   Neurological: Positive for weakness.  Psychiatric/Behavioral: Negative.   All other systems reviewed and are negative.      Objective:   Physical Exam Nursing note reviewed. Exam conducted with a chaperone present.  Constitutional:      Appearance: He is ill-appearing (Chronically ill-appearing).     Comments: Somewhat disheveled gentleman, looks markedly older than stated age.  HENT:     Head: Normocephalic and atraumatic.     Right Ear: External ear normal.     Left Ear: External ear  normal.     Nose: Nose normal.     Mouth/Throat:     Mouth: Mucous membranes are moist.     Dentition: Abnormal dentition (Poor dentition with missing teeth).     Palate: No lesions.     Pharynx: Oropharynx is clear.  Eyes:     General: No scleral icterus.    Extraocular Movements: Extraocular movements intact.     Conjunctiva/sclera: Conjunctivae normal.  Neck:     Musculoskeletal: Neck supple.  Cardiovascular:     Rate and Rhythm: Normal rate and regular rhythm.  Pulmonary:     Effort: Tachypnea, accessory  muscle usage and prolonged expiration present.     Breath sounds: Decreased air movement present. No transmitted upper airway sounds. Decreased breath sounds (Diffusely distant breath sounds) and wheezing (Diffuse end expiratory) present. No rhonchi or rales.  Abdominal:     General: Bowel sounds are normal.     Comments: Protuberant.  Soft.  Nontender.  Musculoskeletal: Normal range of motion.        General: No swelling.     Right lower leg: No edema.     Left lower leg: No edema.  Skin:    General: Skin is warm and dry.     Coloration: Skin is not jaundiced.  Neurological:     General: No focal deficit present.     Mental Status: He is alert and oriented to person, place, and time.  Psychiatric:        Mood and Affect: Affect is flat.        Speech: Speech normal.        Behavior: Behavior is cooperative.        Thought Content: Thought content normal.     All available radiographic data was independently evaluated.      Assessment & Plan:    1.  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  This is carcinoma until proven otherwise.  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  The patient however is reluctant to undergo bronchoscopic evaluation and biopsy.  He prefers to undergo radiation and does not want to undergo further procedures.  2.  Chronic obstructive pulmonary disease appears to be moderate to severe: Likely has advanced to severe.  He has very limited reserve, he was advised to use his inhalers as prescribed by Dr. Raul Del.  This would include Advair 2 puffs twice a day he uses HFA 115 mcg/21.  Continue to use Proventil as needed.  He is on theophylline which may be aggravating his reflux and he was encouraged to discuss with Dr. Raul Del about discontinuing this medication.  Patient was offered a prescription of Trelegy Ellipta to try to see if this would help with compliance however he declined.  3.  Tobacco abuse and dependence due  to cigarettes: Patient was counseled regards to discontinuation of smoking counseling time 3-5 minutes.   I discussed the case with Telford Nab, RN Oncology Services Navigator.

## 2018-08-21 ENCOUNTER — Encounter: Payer: Self-pay | Admitting: Oncology

## 2018-08-21 DIAGNOSIS — Z7189 Other specified counseling: Secondary | ICD-10-CM | POA: Insufficient documentation

## 2018-08-21 NOTE — Progress Notes (Deleted)
Hematology/Oncology Consult note [facility]  [phone] [phone]  Patient Care Team: Kirk Ruths, MD as PCP - General (Internal Medicine)   Name of the patient: Steve Gregory  [id number]  04/04/1959   Date of visit: 08/21/18  Diagnosis- ***  Chief complaint/ Reason for visit- ***  Heme/Onc history: ***  Interval history- ***  ECOG PS- *** Pain scale- *** Opioid associated constipation- ***  Review of systems- ROS   Current treatment- ***  Allergies  Allergen Reactions  . Chantix [Varenicline] Swelling    Caused tongue swelling.   . Lipitor [Atorvastatin] Swelling    Swelling of tongue  . Mobic [Meloxicam]   . Statins      Past Medical History:  Diagnosis Date  . Allergy   . Anxiety   . Arthritis   . Bronchitis, chronic ([place])   . Cancer ([place])   . COPD (chronic obstructive pulmonary disease) ([place])   . Cough    CHRONIC  . Depression   . Dizziness   . Essential hypertension 06/28/2017  . GERD (gastroesophageal reflux disease)   . H/O emphysema   . Headache   . History of depression 06/05/2014   Last Assessment & Plan:  Mood is doing well on medications.   . History of hiatal hernia   . HOH (hard of hearing)   . Hypertension   . Lung mass   . Orthopnea   . Oxygen decrease    H/O HOME USE, NOT NOW  . Pneumonia    in the past  . PVD (peripheral vascular disease) ([place]) 06/05/2014   Overview:  Smoker with poor pulses  Last Assessment & Plan:  No leg pain is noted of late.   . Seizures (HCC)    sounds like it could be vagal at times, coughing can cause him to fade out and see white lights. had last sz 3 months ago when not taking meds  . Shortness of breath dyspnea    uses many different inhalers  . Varicose veins of both lower extremities with pain 06/28/2017  . Wheezing      Past Surgical History:  Procedure Laterality Date  . CATARACT EXTRACTION W/PHACO Right 10/27/2015   Procedure:  CATARACT EXTRACTION PHACO AND INTRAOCULAR LENS PLACEMENT ([place]) suture placed in right eye at end of procedure;  Surgeon: Estill Cotta, MD;  Location: [facility] ORS;  Service: Ophthalmology;  Laterality: Right;  [place] 01:02 AP% 24.4 CDE 28.12 fluid pack lot # [id number]  . CATARACT EXTRACTION W/PHACO Left 11/17/2015   Procedure: CATARACT EXTRACTION PHACO AND INTRAOCULAR LENS PLACEMENT ([place]);  Surgeon: Estill Cotta, MD;  Location: [facility] ORS;  Service: Ophthalmology;  Laterality: Left;  [place] 01:29 AP% 24.3 CDE 40.05 fluid pack lot # [id number]  . COLONOSCOPY WITH PROPOFOL N/A 07/28/2017   Procedure: COLONOSCOPY WITH PROPOFOL;  Surgeon: [name], MD;  Location: [facility]COPY;  Service: Endoscopy;  Laterality: N/A;  . ESOPHAGOGASTRODUODENOSCOPY (EGD) WITH PROPOFOL N/A 06/06/2017   Procedure: ESOPHAGOGASTRODUODENOSCOPY (EGD) WITH PROPOFOL;  Surgeon: [name], MD;  Location: [facility] ENDOSCOPY;  Service: Endoscopy;  Laterality: N/A;  . EYE SURGERY    . [place]   LEFT CHEEK BONE INJURY 1980    Social History   Socioeconomic History  . Marital status: Single    Spouse name: Not on file  . Number of children: Not on file  . Years of education: Not on file  . Highest education level: Not on file  Occupational History  . Not on file  Social Needs  . Financial resource strain: Not on file  . Food insecurity:    Worry: Not on file    Inability: Not on file  . Transportation needs:    Medical: Not on file    Non-medical: Not on file  Tobacco Use  . Smoking status: Current Every Day Smoker    Packs/day: 0.50    Years: 43.00    Pack years: 21.50  . Smokeless tobacco: Never Used  Substance and Sexual Activity  . Alcohol use: No  . Drug use: No  . Sexual activity: Not on file  Lifestyle  . Physical activity:    Days per week: Not on file    Minutes per session: Not on file  . Stress: Not on file  Relationships  . Social connections:    Talks on  phone: Not on file    Gets together: Not on file    Attends religious service: Not on file    Active member of club or organization: Not on file    Attends meetings of clubs or organizations: Not on file    Relationship status: Not on file  . Intimate partner violence:    Fear of current or ex partner: Not on file    Emotionally abused: Not on file    Physically abused: Not on file    Forced sexual activity: Not on file  Other Topics Concern  . Not on file  Social History Narrative  . Not on file    Family History  Problem Relation Age of Onset  . Hyperlipidemia Mother   . Stroke Father   . Aneurysm Father   . Heart disease Brother   . Hypertension Brother      Current Outpatient Medications:  .  albuterol (PROVENTIL) (2.5 MG/3ML) 0.083% nebulizer solution, Take 2.5 mg by nebulization every 6 (six) hours as needed for wheezing or shortness of breath., Disp: , Rfl:  .  fluticasone (FLONASE) 50 MCG/ACT nasal spray, Place 2 sprays into both nostrils daily., Disp: , Rfl:  .  Fluticasone-Salmeterol (ADVAIR) 250-50 MCG/DOSE AEPB, Inhale 1 puff into the lungs 2 (two) times daily., Disp: , Rfl:  .  ipratropium-albuterol (DUONEB) 0.5-2.5 (3) MG/3ML SOLN, Take 3 mLs by nebulization., Disp: , Rfl:  .  losartan (COZAAR) 25 MG tablet, Take 25 mg by mouth daily., Disp: , Rfl:  .  montelukast (SINGULAIR) 10 MG tablet, Take 10 mg by mouth every morning., Disp: , Rfl:  .  pantoprazole (PROTONIX) 40 MG tablet, Take 40 mg by mouth 2 (two) times daily. , Disp: , Rfl:  .  predniSONE (DELTASONE) 10 MG tablet, Take 15 mg by mouth daily with breakfast., Disp: , Rfl:  .  theophylline (UNIPHYL) 400 MG 24 hr tablet, Take 400 mg by mouth daily., Disp: , Rfl:  .  aspirin EC 81 MG tablet, Take by mouth., Disp: , Rfl:   Physical exam:  Vitals:   08/18/18 0859 08/18/18 0907  BP: (!) 150/84   Pulse: (!) 105   Temp: 97.8 F (36.6 C)   TempSrc: Tympanic   SpO2: 94% 94%  Weight: 172 lb 11.2 oz (78.3 kg)    Height: 5\' 9"  (1.753 m)    Physical Exam   CMP Latest Ref Rng & Units 04/03/2017  Glucose 65 - 99 mg/dL 109(H)  BUN 6 - 20 mg/dL 12  Creatinine 0.61 - 1.24 mg/dL 0.89  Sodium 135 - 145 mmol/L 140  Potassium  3.5 - 5.1 mmol/L 4.0  Chloride 101 - 111 mmol/L 106  CO2 22 - 32 mmol/L 26  Calcium 8.9 - 10.3 mg/dL 9.3  Total Protein 6.5 - 8.1 g/dL 7.0  Total Bilirubin 0.3 - 1.2 mg/dL 1.0  Alkaline Phos 38 - 126 U/L 77  AST 15 - 41 U/L 15  ALT 17 - 63 U/L 8(L)   CBC Latest Ref Rng & Units 04/03/2017  WBC 3.8 - 10.6 K/uL 12.0(H)  Hemoglobin 13.0 - 18.0 g/dL 14.7  Hematocrit 40.0 - 52.0 % 44.1  Platelets 150 - 440 K/uL 261    No images are attached to the encounter.  Ct Chest W Contrast  Result Date: 08/03/2018 CLINICAL DATA:  Non-small-cell lung cancer. Primary radiation therapy. Bilateral upper lobe primaries. EXAM: CT CHEST WITH CONTRAST TECHNIQUE: Multidetector CT imaging of the chest was performed during intravenous contrast administration. CONTRAST:  23mL OMNIPAQUE IOHEXOL 300 MG/ML  SOLN COMPARISON:  02/13/2018 FINDINGS: Cardiovascular: Aortic and branch vessel atherosclerosis. Normal heart size, without pericardial effusion. No central pulmonary embolism, on this non-dedicated study. Mediastinum/Nodes: No supraclavicular adenopathy. A right hilar node is similar and upper normal size at 11 mm. A left suprahilar node versus less likely central pulmonary nodule measures 9 mm on image 58/2 versus 5 mm on image 65/2 of the prior. Lungs/Pleura: No pleural fluid.  Moderate centrilobular emphysema. Lower lobe predominant bronchial wall thickening. Basilar predominant subpleural interstitial thickening is worse on the right and slightly increased. No residual right upper lobe pulmonary nodule identified at the site of 6 mm nodule on the prior. Right lower lobe pulmonary nodule of 5 mm on image 106/3, similar on the prior and unchanged to 09/28/2016, favoring a benign etiology. Decrease in irregular  subpleural left upper lobe pulmonary nodule. Example 1.3 x 1.4 cm on image 39/3. Compare maximally 2.2 x 2.0 cm on the prior. Upper Abdomen: Scattered low-density liver lesions are favored to represent cysts. Technically too small to characterize. Normal imaged portions of the spleen, pancreas, gallbladder, kidneys. Left greater than right adrenal nodularity is mild and similar. Similar appearance of the proximal stomach, with underdistention and apparent greater curvature wall thickening. Musculoskeletal: No acute osseous abnormality. IMPRESSION: 1. Response to therapy of left upper lobe pulmonary nodule. 2. Left suprahilar node versus less likely central left upper lobe pulmonary nodule has enlarged in the interval. Suspicious for nodal metastasis. Consider short-term follow-up chest CT at 3 months versus further evaluation with PET. 3. No residual nodule at the right upper lobe site of treated malignancy. 4. Aortic atherosclerosis (ICD10-I70.0), coronary artery atherosclerosis and emphysema (ICD10-J43.9). 5. Suspect interstitial lung disease as evidenced by subpleural reticulation at the right greater than left bases. This could be post infectious/inflammatory, or represent nonspecific interstitial pneumonitis. 6. Similar appearance of the stomach, with underdistention and possible greater curvature wall thickening. Correlate with symptoms of gastritis. Electronically Signed   By: Abigail Miyamoto M.D.   On: 08/03/2018 07:23   Nm Pet Image Initial (pi) Skull Base To Thigh  Result Date: 08/14/2018 CLINICAL DATA:  Subsequent treatment strategy for lung mass. EXAM: NUCLEAR MEDICINE PET SKULL BASE TO THIGH TECHNIQUE: 9.0 mCi F-18 FDG was injected intravenously. Full-ring PET imaging was performed from the skull base to thigh after the radiotracer. CT data was obtained and used for attenuation correction and anatomic localization. Fasting blood glucose: 82 mg/dl COMPARISON:  Chest CT 08/02/2018.  PET-CT 05/03/2017.  FINDINGS: Mediastinal blood pool activity: SUV max 2.3 NECK: No hypermetabolic lymph nodes in the neck.  Incidental CT findings: none CHEST: 1.3 x 1.4 cm irregular peripheral left upper lobe nodule described on the 08/02/2018 exam has not changed substantially in size in the interval. This shows low level FDG uptake with SUV max = 2.7. Left suprahilar lymph node versus central parenchymal nodule measured at 9 mm on 08/02/2018 and documented on that exam as increased from 5 mm on 02/13/2018 is markedly hypermetabolic with SUV max = 7.6. No other suspicious soft tissue hypermetabolic disease in the thorax. Incidental CT findings: Coronary artery calcification is evident. No thoracic aortic aneurysm. ABDOMEN/PELVIS: No abnormal hypermetabolic activity within the liver, pancreas, adrenal glands, or spleen. No hypermetabolic lymph nodes in the abdomen or pelvis. Incidental CT findings: Stable hypoattenuating lesion inferior right liver measuring 2 cm. Stable left renal cyst. There is abdominal aortic atherosclerosis without aneurysm. SKELETON: Focal uptake is identified in the left chest wall between the lateral second and third and lateral third and fourth ribs. No underlying chest wall mass evident at either location and both areas are overlying the known peripheral 1.4 cm left upper lobe nodule. Uptake may potentially be related to treatment/radiation. Incidental CT findings: No worrisome lytic or sclerotic osseous abnormality. IMPRESSION: 1. Enlarging left suprahilar/central left upper lobe nodule measuring 9 mm on the 08/02/2018 exam is markedly hypermetabolic with SUV max = 7.6. Imaging features are compatible with a metastatic lymph node although metachronous central lung primary not excluded. 2. Low level FDG accumulation in the chest wall overlying the known peripheral left upper lobe pulmonary lesion without mass lesion evident on CT images. Attention on follow-up suggested. 3.  Aortic Atherosclerois  (ICD10-170.0) Electronically Signed   By: Misty Stanley M.D.   On: 08/14/2018 13:56     Assessment and plan- Patient is a 62 y.o. male ***   Visit Diagnosis No diagnosis found.   Dr. Randa Evens, MD, MPH [facility] [phone] 08/21/2018 8:17 AM

## 2018-08-21 NOTE — Progress Notes (Signed)
Hematology/Oncology Consult note [facility] Telephone:([phone] [phone]  Patient Care Team: Kirk Ruths, MD as PCP - General (Internal Medicine)   Name of the patient: Steve Gregory  [id number]  16-Jun-1956    Reason for referral-history of stage I left upper lobe lung cancer status post SBRT now with a new mediastinal lymph node   Referring physician- Dr. Baruch Gouty   Date of visit: 08/21/18   History of presenting illness-patient is a 62 year old male with a past medical history significant for chronic COPD and bronchitis.  In January 2018 he was noted to have a right upper lobe lung nodule which had increased to 1 cm suspicious for bronchogenic carcinoma.  He was seen by Dr. Donella Stade at that time and underwent SBRT.  He did not undergo a lung biopsy at that time as the patient declined in the biopsy was also risky putting him at a high risk of pneumothorax.  Subsequently he was noted to have new left upper lobe nodule and in June 2019 he again underwent SBRT for that lesion as well.  Patient then underwent a CT chest with contrast in December 2019 which showed left suprahilar node versus central left upper lobe pulmonary nodule which had enlarged from 5 mm to 9 mm concerning for malignancy.  This was followed by a PET CT scan which showed hypermetabolism in that region with an SUV of 7.6.  Patient was also noted to have some metabolic activity in his anus on the PET scan and underwent colonoscopy which was negative for malignancy.  He has undergone upper endoscopy in the past with findings consistent with Barrett's esophagitis.  Patient is here to discuss the results of his PET scan further management.  He continues to smoke and is presently not interested in quitting.  He has some baseline shortness of breath which has remained unchanged.  ECOG PS- 1  Pain scale- 0   Review of systems- Review of Systems  Constitutional: Negative for chills,  fever, malaise/fatigue and weight loss.  HENT: Negative for congestion, ear discharge and nosebleeds.   Eyes: Negative for blurred vision.  Respiratory: Positive for shortness of breath. Negative for cough, hemoptysis, sputum production and wheezing.   Cardiovascular: Negative for chest pain, palpitations, orthopnea and claudication.  Gastrointestinal: Negative for abdominal pain, blood in stool, constipation, diarrhea, heartburn, melena, nausea and vomiting.  Genitourinary: Negative for dysuria, flank pain, frequency, hematuria and urgency.  Musculoskeletal: Negative for back pain, joint pain and myalgias.  Skin: Negative for rash.  Neurological: Negative for dizziness, tingling, focal weakness, seizures, weakness and headaches.  Endo/Heme/Allergies: Does not bruise/bleed easily.  Psychiatric/Behavioral: Negative for depression and suicidal ideas. The patient does not have insomnia.     Allergies  Allergen Reactions  . Chantix [Varenicline] Swelling    Caused tongue swelling.   . Lipitor [Atorvastatin] Swelling    Swelling of tongue  . Mobic [Meloxicam]   . Statins     Patient Active Problem List   Diagnosis Date Noted  . Neurogenic pain 07/10/2018  . Chronic pain syndrome 07/10/2018  . Vitamin D insufficiency 04/18/2018  . Pharmacologic therapy 04/11/2018  . Disorder of skeletal system 04/11/2018  . Problems influencing health status 04/11/2018  . 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 Lumbosacral radiculopathy at S1 01/03/2018  . Chronic bilateral low back pain with bilateral sciatica 01/03/2018  . Lumbar degenerative disc disease 01/03/2018  . Cervicalgia 01/03/2018  . Lung mass 07/11/2017  . Lower  extremity pain, bilateral 06/28/2017  . Varicose veins of both lower extremities with pain 06/28/2017  . Tobacco dependence 06/28/2017  . Hyperglycemia 03/07/2017  . Healthcare maintenance 11/04/2016  . History of depression 06/05/2014  .  PVD (peripheral vascular disease) ([place]) 06/05/2014  . HTN (hypertension), benign 02/22/2014  . GERD (gastroesophageal reflux disease) 02/22/2014  . COPD (chronic obstructive pulmonary disease) with emphysema ([place]) 02/21/2014     Past Medical History:  Diagnosis Date  . Allergy   . Anxiety   . Arthritis   . Bronchitis, chronic ([place])   . Cancer ([place])   . COPD (chronic obstructive pulmonary disease) ([place])   . Cough    CHRONIC  . Depression   . Dizziness   . Essential hypertension 06/28/2017  . GERD (gastroesophageal reflux disease)   . H/O emphysema   . Headache   . History of depression 06/05/2014   Last Assessment & Plan:  Mood is doing well on medications.   . History of hiatal hernia   . HOH (hard of hearing)   . Hypertension   . Lung mass   . Orthopnea   . Oxygen decrease    H/O HOME USE, NOT NOW  . Pneumonia    in the past  . PVD (peripheral vascular disease) ([place]) 06/05/2014   Overview:  Smoker with poor pulses  Last Assessment & Plan:  No leg pain is noted of late.   . Seizures (HCC)    sounds like it could be vagal at times, coughing can cause him to fade out and see white lights. had last sz 3 months ago when not taking meds  . Shortness of breath dyspnea    uses many different inhalers  . Varicose veins of both lower extremities with pain 06/28/2017  . Wheezing      Past Surgical History:  Procedure Laterality Date  . CATARACT EXTRACTION W/PHACO Right 10/27/2015   Procedure: CATARACT EXTRACTION PHACO AND INTRAOCULAR LENS PLACEMENT ([place]) suture placed in right eye at end of procedure;  Surgeon: Estill Cotta, MD;  Location: [facility] ORS;  Service: Ophthalmology;  Laterality: Right;  [place] 01:02 AP% 24.4 CDE 28.12 fluid pack lot # [id number]  . CATARACT EXTRACTION W/PHACO Left 11/17/2015   Procedure: CATARACT EXTRACTION PHACO AND INTRAOCULAR LENS PLACEMENT ([place]);  Surgeon: Estill Cotta, MD;  Location: [facility] ORS;  Service: Ophthalmology;  Laterality: Left;  [place]  01:29 AP% 24.3 CDE 40.05 fluid pack lot # [id number]  . COLONOSCOPY WITH PROPOFOL N/A 07/28/2017   Procedure: COLONOSCOPY WITH PROPOFOL;  Surgeon: [name], MD;  Location: [facility] ENDOSCOPY;  Service: Endoscopy;  Laterality: N/A;  . ESOPHAGOGASTRODUODENOSCOPY (EGD) WITH PROPOFOL N/A 06/06/2017   Procedure: ESOPHAGOGASTRODUODENOSCOPY (EGD) WITH PROPOFOL;  Surgeon: [name], MD;  Location: [facility] ENDOSCOPY;  Service: Endoscopy;  Laterality: N/A;  . EYE SURGERY    . [place]   LEFT CHEEK BONE INJURY 1980    Social History   Socioeconomic History  . Marital status: Single    Spouse name: Not on file  . Number of children: Not on file  . Years of education: Not on file  . Highest education level: Not on file  Occupational History  . Not on file  Social Needs  . Financial resource strain: Not on file  . Food insecurity:    Worry: Not on file    Inability: Not on file  . Transportation needs:    Medical: Not on file    Non-medical: Not on file  Tobacco Use  . Smoking status: Current Every Day Smoker    Packs/day: 0.50    Years: 43.00    Pack years: 21.50  . Smokeless tobacco: Never Used  Substance and Sexual Activity  . Alcohol use: No  . Drug use: No  . Sexual activity: Not on file  Lifestyle  . Physical activity:    Days per week: Not on file    Minutes per session: Not on file  . Stress: Not on file  Relationships  . Social connections:    Talks on phone: Not on file    Gets together: Not on file    Attends religious service: Not on file    Active member of club or organization: Not on file    Attends meetings of clubs or organizations: Not on file    Relationship status: Not on file  . Intimate partner violence:    Fear of current or ex partner: Not on file    Emotionally abused: Not on file    Physically abused: Not on file    Forced sexual activity: Not on file  Other Topics Concern  . Not on file  Social History  Narrative  . Not on file     Family History  Problem Relation Age of Onset  . Hyperlipidemia Mother   . Stroke Father   . Aneurysm Father   . Heart disease Brother   . Hypertension Brother      Current Outpatient Medications:  .  albuterol (PROVENTIL) (2.5 MG/3ML) 0.083% nebulizer solution, Take 2.5 mg by nebulization every 6 (six) hours as needed for wheezing or shortness of breath., Disp: , Rfl:  .  fluticasone (FLONASE) 50 MCG/ACT nasal spray, Place 2 sprays into both nostrils daily., Disp: , Rfl:  .  Fluticasone-Salmeterol (ADVAIR) 250-50 MCG/DOSE AEPB, Inhale 1 puff into the lungs 2 (two) times daily., Disp: , Rfl:  .  ipratropium-albuterol (DUONEB) 0.5-2.5 (3) MG/3ML SOLN, Take 3 mLs by nebulization., Disp: , Rfl:  .  losartan (COZAAR) 25 MG tablet, Take 25 mg by mouth daily., Disp: , Rfl:  .  montelukast (SINGULAIR) 10 MG tablet, Take 10 mg by mouth every morning., Disp: , Rfl:  .  pantoprazole (PROTONIX) 40 MG tablet, Take 40 mg by mouth 2 (two) times daily. , Disp: , Rfl:  .  predniSONE (DELTASONE) 10 MG tablet, Take 15 mg by mouth daily with breakfast., Disp: , Rfl:  .  theophylline (UNIPHYL) 400 MG 24 hr tablet, Take 400 mg by mouth daily., Disp: , Rfl:  .  aspirin EC 81 MG tablet, Take by mouth., Disp: , Rfl:    Physical exam:  Vitals:   08/18/18 0859 08/18/18 0907  BP: (!) 150/84   Pulse: (!) 105   Temp: 97.8 F (36.6 C)   TempSrc: Tympanic   SpO2: 94% 94%  Weight: 172 lb 11.2 oz (78.3 kg)   Height: 5\' 9"  (1.753 m)    Physical Exam HENT:     Head: Normocephalic and atraumatic.  Eyes:     Pupils: Pupils are equal, round, and reactive to light.  Neck:     Musculoskeletal: Normal range of motion.  Cardiovascular:     Rate and Rhythm: Normal rate and regular rhythm.     Heart sounds: Normal heart sounds.  Pulmonary:     Effort: Pulmonary effort is normal.     Breath sounds: Normal breath sounds.  Abdominal:     General: Bowel sounds are normal.  Palpations: Abdomen is soft.  Skin:    General: Skin is warm and dry.  Neurological:     Mental Status: He is alert and oriented to person, place, and time.        CMP Latest Ref Rng & Units 04/03/2017  Glucose 65 - 99 mg/dL 109(H)  BUN 6 - 20 mg/dL 12  Creatinine 0.61 - 1.24 mg/dL 0.89  Sodium 135 - 145 mmol/L 140  Potassium 3.5 - 5.1 mmol/L 4.0  Chloride 101 - 111 mmol/L 106  CO2 22 - 32 mmol/L 26  Calcium 8.9 - 10.3 mg/dL 9.3  Total Protein 6.5 - 8.1 g/dL 7.0  Total Bilirubin 0.3 - 1.2 mg/dL 1.0  Alkaline Phos 38 - 126 U/L 77  AST 15 - 41 U/L 15  ALT 17 - 63 U/L 8(L)   CBC Latest Ref Rng & Units 04/03/2017  WBC 3.8 - 10.6 K/uL 12.0(H)  Hemoglobin 13.0 - 18.0 g/dL 14.7  Hematocrit 40.0 - 52.0 % 44.1  Platelets 150 - 440 K/uL 261    No images are attached to the encounter.  Ct Chest W Contrast  Result Date: 08/03/2018 CLINICAL DATA:  Non-small-cell lung cancer. Primary radiation therapy. Bilateral upper lobe primaries. EXAM: CT CHEST WITH CONTRAST TECHNIQUE: Multidetector CT imaging of the chest was performed during intravenous contrast administration. CONTRAST:  17mL OMNIPAQUE IOHEXOL 300 MG/ML  SOLN COMPARISON:  02/13/2018 FINDINGS: Cardiovascular: Aortic and branch vessel atherosclerosis. Normal heart size, without pericardial effusion. No central pulmonary embolism, on this non-dedicated study. Mediastinum/Nodes: No supraclavicular adenopathy. A right hilar node is similar and upper normal size at 11 mm. A left suprahilar node versus less likely central pulmonary nodule measures 9 mm on image 58/2 versus 5 mm on image 65/2 of the prior. Lungs/Pleura: No pleural fluid.  Moderate centrilobular emphysema. Lower lobe predominant bronchial wall thickening. Basilar predominant subpleural interstitial thickening is worse on the right and slightly increased. No residual right upper lobe pulmonary nodule identified at the site of 6 mm nodule on the prior. Right lower lobe pulmonary nodule  of 5 mm on image 106/3, similar on the prior and unchanged to 09/28/2016, favoring a benign etiology. Decrease in irregular subpleural left upper lobe pulmonary nodule. Example 1.3 x 1.4 cm on image 39/3. Compare maximally 2.2 x 2.0 cm on the prior. Upper Abdomen: Scattered low-density liver lesions are favored to represent cysts. Technically too small to characterize. Normal imaged portions of the spleen, pancreas, gallbladder, kidneys. Left greater than right adrenal nodularity is mild and similar. Similar appearance of the proximal stomach, with underdistention and apparent greater curvature wall thickening. Musculoskeletal: No acute osseous abnormality. IMPRESSION: 1. Response to therapy of left upper lobe pulmonary nodule. 2. Left suprahilar node versus less likely central left upper lobe pulmonary nodule has enlarged in the interval. Suspicious for nodal metastasis. Consider short-term follow-up chest CT at 3 months versus further evaluation with PET. 3. No residual nodule at the right upper lobe site of treated malignancy. 4. Aortic atherosclerosis (ICD10-I70.0), coronary artery atherosclerosis and emphysema (ICD10-J43.9). 5. Suspect interstitial lung disease as evidenced by subpleural reticulation at the right greater than left bases. This could be post infectious/inflammatory, or represent nonspecific interstitial pneumonitis. 6. Similar appearance of the stomach, with underdistention and possible greater curvature wall thickening. Correlate with symptoms of gastritis. Electronically Signed   By: Abigail Miyamoto M.D.   On: 08/03/2018 07:23   Nm Pet Image Initial (pi) Skull Base To Thigh  Result Date: 08/14/2018 CLINICAL DATA:  Subsequent  treatment strategy for lung mass. EXAM: NUCLEAR MEDICINE PET SKULL BASE TO THIGH TECHNIQUE: 9.0 mCi F-18 FDG was injected intravenously. Full-ring PET imaging was performed from the skull base to thigh after the radiotracer. CT data was obtained and used for attenuation  correction and anatomic localization. Fasting blood glucose: 82 mg/dl COMPARISON:  Chest CT 08/02/2018.  PET-CT 05/03/2017. FINDINGS: Mediastinal blood pool activity: SUV max 2.3 NECK: No hypermetabolic lymph nodes in the neck. Incidental CT findings: none CHEST: 1.3 x 1.4 cm irregular peripheral left upper lobe nodule described on the 08/02/2018 exam has not changed substantially in size in the interval. This shows low level FDG uptake with SUV max = 2.7. Left suprahilar lymph node versus central parenchymal nodule measured at 9 mm on 08/02/2018 and documented on that exam as increased from 5 mm on 02/13/2018 is markedly hypermetabolic with SUV max = 7.6. No other suspicious soft tissue hypermetabolic disease in the thorax. Incidental CT findings: Coronary artery calcification is evident. No thoracic aortic aneurysm. ABDOMEN/PELVIS: No abnormal hypermetabolic activity within the liver, pancreas, adrenal glands, or spleen. No hypermetabolic lymph nodes in the abdomen or pelvis. Incidental CT findings: Stable hypoattenuating lesion inferior right liver measuring 2 cm. Stable left renal cyst. There is abdominal aortic atherosclerosis without aneurysm. SKELETON: Focal uptake is identified in the left chest wall between the lateral second and third and lateral third and fourth ribs. No underlying chest wall mass evident at either location and both areas are overlying the known peripheral 1.4 cm left upper lobe nodule. Uptake may potentially be related to treatment/radiation. Incidental CT findings: No worrisome lytic or sclerotic osseous abnormality. IMPRESSION: 1. Enlarging left suprahilar/central left upper lobe nodule measuring 9 mm on the 08/02/2018 exam is markedly hypermetabolic with SUV max = 7.6. Imaging features are compatible with a metastatic lymph node although metachronous central lung primary not excluded. 2. Low level FDG accumulation in the chest wall overlying the known peripheral left upper lobe  pulmonary lesion without mass lesion evident on CT images. Attention on follow-up suggested. 3.  Aortic Atherosclerois (ICD10-170.0) Electronically Signed   By: Misty Stanley M.D.   On: 08/14/2018 13:56    Assessment and plan- Patient is a 62 y.o. male with prior history of lung cancer in the right upper lobe and left upper lobes treated with SBRT in the past..  Patient has now developed a new left suprahilar lymph node/central left upper lobe nodule which is hypermetabolism on the PET scan  We have reviewed patient's CT chest as well as PET/CT scan at the tumor board.  The only site of hypermetabolism is a left suprahilar lymph node.  The prior 2 areas in the right upper lobe and left upper lobe which had been treated with radiation have responded well.  There are no other signs of distant metastatic disease.  Dr. Donella Stade is of the opinion that since he has responded well to radiation in the past he would like to attempt another SBRT instead of offering concurrent chemoradiation to the left suprahilar lymph node.  We discussed attempting biopsy prior to radiation.  I have discussed these findings with the patient.  He will be seeing pulmonary after me to discuss the possibility of a biopsy.  After that he will follow-up with Dr. Donella Stade for SBRT to that region.  Patient will continue to follow-up with radiation oncology at this time and continue to get surveillance scans every 3-6 months with them.  He can be referred back to [place] in the  future if there are any concerning findings of locally advanced disease or metastatic disease.  Treatment will be given by radiation oncology with the potential curative intent   Thank you for this kind referral and the opportunity to participate in the care of this patient   Visit Diagnosis 1. Goals of care, counseling/discussion   2. Malignant neoplasm of upper lobe of left lung ([place])     Dr. Randa Evens, MD, MPH [facility] [phone] 08/21/2018 8:27 AM

## 2018-09-07 ENCOUNTER — Ambulatory Visit: Payer: Medicare Other

## 2018-09-07 ENCOUNTER — Ambulatory Visit
Admission: RE | Admit: 2018-09-07 | Discharge: 2018-09-07 | Disposition: A | Discharge status: Home / Self Care | Payer: Medicare Other | Source: Ambulatory Visit | Attending: Radiation Oncology | Admitting: Radiation Oncology

## 2018-09-07 DIAGNOSIS — Z510 Encounter for antineoplastic radiation therapy: Secondary | ICD-10-CM | POA: Insufficient documentation

## 2018-09-07 DIAGNOSIS — C3411 Malignant neoplasm of upper lobe, right bronchus or lung: Secondary | ICD-10-CM | POA: Diagnosis not present

## 2018-09-11 DIAGNOSIS — Z510 Encounter for antineoplastic radiation therapy: Secondary | ICD-10-CM | POA: Diagnosis not present

## 2018-09-15 ENCOUNTER — Other Ambulatory Visit: Payer: Self-pay | Admitting: *Deleted

## 2018-09-15 DIAGNOSIS — C3412 Malignant neoplasm of upper lobe, left bronchus or lung: Secondary | ICD-10-CM

## 2018-09-18 ENCOUNTER — Ambulatory Visit
Admission: RE | Admit: 2018-09-18 | Discharge: 2018-09-18 | Disposition: A | Discharge status: Home / Self Care | Payer: Medicare Other | Source: Ambulatory Visit | Attending: Radiation Oncology | Admitting: Radiation Oncology

## 2018-09-19 ENCOUNTER — Ambulatory Visit: Payer: Medicare Other

## 2018-09-19 ENCOUNTER — Ambulatory Visit
Admission: RE | Admit: 2018-09-19 | Discharge: 2018-09-19 | Disposition: A | Discharge status: Home / Self Care | Payer: Medicare Other | Source: Ambulatory Visit | Attending: Radiation Oncology | Admitting: Radiation Oncology

## 2018-09-19 DIAGNOSIS — Z510 Encounter for antineoplastic radiation therapy: Secondary | ICD-10-CM | POA: Diagnosis not present

## 2018-09-20 ENCOUNTER — Ambulatory Visit
Admission: RE | Admit: 2018-09-20 | Discharge: 2018-09-20 | Disposition: A | Discharge status: Home / Self Care | Payer: Medicare Other | Source: Ambulatory Visit | Attending: Radiation Oncology | Admitting: Radiation Oncology

## 2018-09-20 DIAGNOSIS — Z510 Encounter for antineoplastic radiation therapy: Secondary | ICD-10-CM | POA: Diagnosis not present

## 2018-09-21 ENCOUNTER — Ambulatory Visit
Admission: RE | Admit: 2018-09-21 | Discharge: 2018-09-21 | Disposition: A | Discharge status: Home / Self Care | Payer: Medicare Other | Source: Ambulatory Visit | Attending: Radiation Oncology | Admitting: Radiation Oncology

## 2018-09-21 ENCOUNTER — Inpatient Hospital Stay: Payer: Medicare Other | Attending: Oncology

## 2018-09-21 ENCOUNTER — Ambulatory Visit: Payer: Medicare Other

## 2018-09-21 DIAGNOSIS — Z510 Encounter for antineoplastic radiation therapy: Secondary | ICD-10-CM | POA: Diagnosis not present

## 2018-09-21 DIAGNOSIS — C3412 Malignant neoplasm of upper lobe, left bronchus or lung: Secondary | ICD-10-CM | POA: Insufficient documentation

## 2018-09-21 LAB — CBC
HCT: 42.8 % (ref 39.0–52.0)
Hemoglobin: 13.8 g/dL (ref 13.0–17.0)
MCH: 29.7 pg (ref 26.0–34.0)
MCHC: 32.2 g/dL (ref 30.0–36.0)
MCV: 92 fL (ref 80.0–100.0)
Platelets: 331 10*3/uL (ref 150–400)
RBC: 4.65 MIL/uL (ref 4.22–5.81)
RDW: 13.6 % (ref 11.5–15.5)
WBC: 11 10*3/uL — ABNORMAL HIGH (ref 4.0–10.5)
nRBC: 0 % (ref 0.0–0.2)

## 2018-09-22 ENCOUNTER — Ambulatory Visit
Admission: RE | Admit: 2018-09-22 | Discharge: 2018-09-22 | Disposition: A | Discharge status: Home / Self Care | Payer: Medicare Other | Source: Ambulatory Visit | Attending: Radiation Oncology | Admitting: Radiation Oncology

## 2018-09-22 DIAGNOSIS — Z510 Encounter for antineoplastic radiation therapy: Secondary | ICD-10-CM | POA: Diagnosis not present

## 2018-09-25 ENCOUNTER — Ambulatory Visit
Admission: RE | Admit: 2018-09-25 | Discharge: 2018-09-25 | Disposition: A | Discharge status: Home / Self Care | Payer: Medicare Other | Source: Ambulatory Visit | Attending: Radiation Oncology | Admitting: Radiation Oncology

## 2018-09-25 DIAGNOSIS — Z510 Encounter for antineoplastic radiation therapy: Secondary | ICD-10-CM | POA: Diagnosis not present

## 2018-09-26 ENCOUNTER — Ambulatory Visit: Payer: Medicare Other

## 2018-09-26 ENCOUNTER — Ambulatory Visit
Admission: RE | Admit: 2018-09-26 | Discharge: 2018-09-26 | Disposition: A | Discharge status: Home / Self Care | Payer: Medicare Other | Source: Ambulatory Visit | Attending: Radiation Oncology | Admitting: Radiation Oncology

## 2018-09-26 DIAGNOSIS — Z510 Encounter for antineoplastic radiation therapy: Secondary | ICD-10-CM | POA: Diagnosis not present

## 2018-09-27 ENCOUNTER — Ambulatory Visit
Admission: RE | Admit: 2018-09-27 | Discharge: 2018-09-27 | Disposition: A | Discharge status: Home / Self Care | Payer: Medicare Other | Source: Ambulatory Visit | Attending: Radiation Oncology | Admitting: Radiation Oncology

## 2018-09-27 DIAGNOSIS — Z510 Encounter for antineoplastic radiation therapy: Secondary | ICD-10-CM | POA: Diagnosis not present

## 2018-09-28 ENCOUNTER — Ambulatory Visit: Payer: Medicare Other

## 2018-09-28 ENCOUNTER — Ambulatory Visit
Admission: RE | Admit: 2018-09-28 | Discharge: 2018-09-28 | Disposition: A | Discharge status: Home / Self Care | Payer: Medicare Other | Source: Ambulatory Visit | Attending: Radiation Oncology | Admitting: Radiation Oncology

## 2018-09-28 DIAGNOSIS — Z510 Encounter for antineoplastic radiation therapy: Secondary | ICD-10-CM | POA: Diagnosis not present

## 2018-09-29 ENCOUNTER — Ambulatory Visit
Admission: RE | Admit: 2018-09-29 | Discharge: 2018-09-29 | Disposition: A | Discharge status: Home / Self Care | Payer: Medicare Other | Source: Ambulatory Visit | Attending: Radiation Oncology | Admitting: Radiation Oncology

## 2018-09-29 DIAGNOSIS — Z510 Encounter for antineoplastic radiation therapy: Secondary | ICD-10-CM | POA: Diagnosis not present

## 2018-10-02 ENCOUNTER — Ambulatory Visit
Admission: RE | Admit: 2018-10-02 | Discharge: 2018-10-02 | Disposition: A | Discharge status: Home / Self Care | Payer: Medicare Other | Source: Ambulatory Visit | Attending: Radiation Oncology | Admitting: Radiation Oncology

## 2018-10-02 DIAGNOSIS — C3411 Malignant neoplasm of upper lobe, right bronchus or lung: Secondary | ICD-10-CM | POA: Diagnosis not present

## 2018-10-02 DIAGNOSIS — Z510 Encounter for antineoplastic radiation therapy: Secondary | ICD-10-CM | POA: Diagnosis present

## 2018-10-03 ENCOUNTER — Ambulatory Visit: Payer: Medicare Other

## 2018-10-30 ENCOUNTER — Ambulatory Visit: Payer: Medicare Other | Admitting: Radiation Oncology

## 2018-11-01 ENCOUNTER — Ambulatory Visit
Admission: RE | Admit: 2018-11-01 | Discharge: 2018-11-01 | Disposition: A | Discharge status: Home / Self Care | Payer: Medicare Other | Source: Ambulatory Visit | Attending: Radiation Oncology | Admitting: Radiation Oncology

## 2018-11-01 ENCOUNTER — Other Ambulatory Visit: Payer: Self-pay

## 2018-11-01 ENCOUNTER — Other Ambulatory Visit: Payer: Self-pay | Admitting: *Deleted

## 2018-11-01 ENCOUNTER — Encounter: Payer: Self-pay | Admitting: Radiation Oncology

## 2018-11-01 VITALS — BP 158/86 | HR 80 | Temp 98.2°F | Resp 18 | Wt 171.7 lb

## 2018-11-01 DIAGNOSIS — F17210 Nicotine dependence, cigarettes, uncomplicated: Secondary | ICD-10-CM | POA: Insufficient documentation

## 2018-11-01 DIAGNOSIS — C3411 Malignant neoplasm of upper lobe, right bronchus or lung: Secondary | ICD-10-CM | POA: Diagnosis not present

## 2018-11-01 DIAGNOSIS — Z923 Personal history of irradiation: Secondary | ICD-10-CM | POA: Diagnosis not present

## 2018-11-01 DIAGNOSIS — R918 Other nonspecific abnormal finding of lung field: Secondary | ICD-10-CM

## 2018-11-01 DIAGNOSIS — C771 Secondary and unspecified malignant neoplasm of intrathoracic lymph nodes: Secondary | ICD-10-CM | POA: Diagnosis present

## 2018-11-01 DIAGNOSIS — C3412 Malignant neoplasm of upper lobe, left bronchus or lung: Secondary | ICD-10-CM

## 2018-11-01 NOTE — Progress Notes (Signed)
Radiation Oncology Follow up Note  Name: Steve Gregory   Date:   11/01/2018 MRN:  [id number] DOB: May 03, 1956    This 63 y.o. male presents to the clinic today for ne-month follow-up status post hypofractionated course of radiation therapy to left supra hilar node in patient with prior history of SB RT 2.  REFERRING PROVIDER: Kirk Ruths, MD  HPI: patient is a 63 year old male now out 1 month having completed hyper fraction course of radiation therapy to left suprahilar nodal metastasis. Ration is had 2 previous SB RT's 1 to his right upper lobe as well as left upper lobe. Then presented with progressive disease in left suprahilar region.which had progressed over time. Lesion was hypermetabolic with SUV of 7.6. He is seen today one month out and is doing well. He specifically denies cough hemoptysis or chest tightness. He occasionally has some slight dysphasia.  COMPLICATIONS OF TREATMENT: none  FOLLOW UP COMPLIANCE: keeps appointments   PHYSICAL EXAM:  BP (!) 158/86   Pulse 80   Temp 98.2 F (36.8 C)   Resp 18   Wt 171 lb 11.8 oz (77.9 kg)   SpO2 96%   BMI 25.36 kg/m  Well-developed well-nourished patient in NAD. HEENT reveals PERLA, EOMI, discs not visualized.  Oral cavity is clear. No oral mucosal lesions are identified. Neck is clear without evidence of cervical or supraclavicular adenopathy. Lungs are clear to A&P. Cardiac examination is essentially unremarkable with regular rate and rhythm without murmur rub or thrill. Abdomen is benign with no organomegaly or masses noted. Motor sensory and DTR levels are equal and symmetric in the upper and lower extremities. Cranial nerves II through XII are grossly intact. Proprioception is intact. No peripheral adenopathy or edema is identified. No motor or sensory levels are noted. Crude visual fields are within normal range.  RADIOLOGY RESULTS: CT scan 3 months ordered  PLAN: present time patient is clinically stable. I've ordered a  CT scan in 3 months and will see him shortly after in follow-up. Patient is to call anytime with any concerns. He continues to be asymptomatic. I'm please was overall progress.  I would like to take this opportunity to thank you for allowing me to participate in the care of your patient.Noreene Filbert, MD

## 2018-12-07 ENCOUNTER — Ambulatory Visit: Payer: Medicare Other | Admitting: Radiation Oncology

## 2019-01-16 ENCOUNTER — Other Ambulatory Visit: Payer: Self-pay

## 2019-01-16 ENCOUNTER — Ambulatory Visit
Admission: RE | Admit: 2019-01-16 | Discharge: 2019-01-16 | Disposition: A | Discharge status: Home / Self Care | Payer: Medicare Other | Source: Ambulatory Visit | Attending: Radiation Oncology | Admitting: Radiation Oncology

## 2019-01-16 DIAGNOSIS — C3412 Malignant neoplasm of upper lobe, left bronchus or lung: Secondary | ICD-10-CM | POA: Diagnosis present

## 2019-01-16 DIAGNOSIS — R918 Other nonspecific abnormal finding of lung field: Secondary | ICD-10-CM | POA: Diagnosis present

## 2019-01-16 MED ORDER — IOHEXOL 300 MG/ML  SOLN
75.0000 mL | Freq: Once | INTRAMUSCULAR | Status: AC | PRN
  Administered 2019-01-16: 75 mL via INTRAVENOUS

## 2019-01-18 ENCOUNTER — Ambulatory Visit
Admission: RE | Admit: 2019-01-18 | Discharge: 2019-01-18 | Disposition: A | Discharge status: Home / Self Care | Payer: Medicare Other | Source: Ambulatory Visit | Attending: Radiation Oncology | Admitting: Radiation Oncology

## 2019-01-18 ENCOUNTER — Other Ambulatory Visit: Payer: Self-pay

## 2019-01-18 ENCOUNTER — Other Ambulatory Visit: Payer: Self-pay | Admitting: *Deleted

## 2019-01-18 ENCOUNTER — Encounter: Payer: Self-pay | Admitting: Radiation Oncology

## 2019-01-18 VITALS — BP 166/89 | HR 92 | Temp 97.9°F | Resp 18 | Wt 168.5 lb

## 2019-01-18 DIAGNOSIS — F17210 Nicotine dependence, cigarettes, uncomplicated: Secondary | ICD-10-CM | POA: Insufficient documentation

## 2019-01-18 DIAGNOSIS — R918 Other nonspecific abnormal finding of lung field: Secondary | ICD-10-CM | POA: Insufficient documentation

## 2019-01-18 DIAGNOSIS — C771 Secondary and unspecified malignant neoplasm of intrathoracic lymph nodes: Secondary | ICD-10-CM | POA: Diagnosis present

## 2019-01-18 DIAGNOSIS — C3411 Malignant neoplasm of upper lobe, right bronchus or lung: Secondary | ICD-10-CM | POA: Insufficient documentation

## 2019-01-18 DIAGNOSIS — C3412 Malignant neoplasm of upper lobe, left bronchus or lung: Secondary | ICD-10-CM

## 2019-01-18 DIAGNOSIS — Z923 Personal history of irradiation: Secondary | ICD-10-CM | POA: Insufficient documentation

## 2019-01-18 NOTE — Progress Notes (Signed)
Radiation Oncology Follow up Note  Name: Steve Gregory   Date:   01/18/2019 MRN:  [id number] DOB: 12/23/1955    This 63 y.o. male presents to the clinic today for follow-up 3 months out from hypofractionated course of radiation therapy to his left suprahilar chest for nodal metastasis.  REFERRING PROVIDER: Kirk Ruths, MD  HPI: Patient is a 63 year old male well-known to our department having received SBRT x2 as well as now 3 months out from hypofractionated course of radiation therapy to left suprahilar lymph node.  Patient is been having increasing left shoulder pain recently had a CT scan.  Showing resolution of the left suprahilar nodules.  Unfortunately there is been in interval marked increase in the prevascular lymphadenopathy highly concerning for metastatic disease.  He has difficulty moving his left upper extremity CT scan did not demonstrate any bone lesions although the exam was limited on my review.  He specifically denies cough hemoptysis or chest tightness.  COMPLICATIONS OF TREATMENT: none  FOLLOW UP COMPLIANCE: keeps appointments   PHYSICAL EXAM:  BP (!) 166/89   Pulse 92   Temp 97.9 F (36.6 C)   Resp 18   Wt 168 lb 8.7 oz (76.5 kg)   BMI 24.89 kg/m  Range of motion is extremely limited secondary to pain in his left upper extremity.  Well-developed well-nourished patient in NAD. HEENT reveals PERLA, EOMI, discs not visualized.  Oral cavity is clear. No oral mucosal lesions are identified. Neck is clear without evidence of cervical or supraclavicular adenopathy. Lungs are clear to A&P. Cardiac examination is essentially unremarkable with regular rate and rhythm without murmur rub or thrill. Abdomen is benign with no organomegaly or masses noted. Motor sensory and DTR levels are equal and symmetric in the upper and lower extremities. Cranial nerves II through XII are grossly intact. Proprioception is intact. No peripheral adenopathy or edema is identified. No motor  or sensory levels are noted. Crude visual fields are within normal range.  RADIOLOGY RESULTS: CT scan reviewed compatible with above-stated findings.  PET CT scan has been ordered  PLAN: At this time of ordered a PET CT scan to delineate any possible evidence of metastatic disease either in his left shoulder bone or elsewhere in his body.  Should the prevascular lymphadenopathy be PET positive and only site of disease would offer SBRT to that lesion as well.  Patient comprehends my treatment plan well PET CT scan and follow-up appointment ordered.  I would like to take this opportunity to thank you for allowing me to participate in the care of your patient.Noreene Filbert, MD

## 2019-01-29 ENCOUNTER — Ambulatory Visit: Payer: Medicare Other

## 2019-01-30 ENCOUNTER — Other Ambulatory Visit: Payer: Medicare Other

## 2019-02-01 ENCOUNTER — Ambulatory Visit: Payer: Medicare Other

## 2019-02-14 ENCOUNTER — Other Ambulatory Visit: Payer: Self-pay

## 2019-02-14 ENCOUNTER — Ambulatory Visit
Admission: RE | Admit: 2019-02-14 | Discharge: 2019-02-14 | Disposition: A | Discharge status: Home / Self Care | Payer: Medicare Other | Source: Ambulatory Visit | Attending: Radiation Oncology | Admitting: Radiation Oncology

## 2019-02-14 DIAGNOSIS — C3412 Malignant neoplasm of upper lobe, left bronchus or lung: Secondary | ICD-10-CM

## 2019-02-14 DIAGNOSIS — J439 Emphysema, unspecified: Secondary | ICD-10-CM | POA: Diagnosis not present

## 2019-02-14 DIAGNOSIS — I700 Atherosclerosis of aorta: Secondary | ICD-10-CM | POA: Diagnosis not present

## 2019-02-14 DIAGNOSIS — R599 Enlarged lymph nodes, unspecified: Secondary | ICD-10-CM | POA: Insufficient documentation

## 2019-02-14 LAB — GLUCOSE, CAPILLARY: Glucose-Capillary: 100 mg/dL — ABNORMAL HIGH (ref 70–99)

## 2019-02-14 MED ORDER — FLUDEOXYGLUCOSE F - 18 (FDG) INJECTION
8.7000 | Freq: Once | INTRAVENOUS | Status: AC | PRN
  Administered 2019-02-14: 8.95 via INTRAVENOUS

## 2019-02-15 ENCOUNTER — Ambulatory Visit: Payer: Medicare Other | Admitting: Radiation Oncology

## 2019-02-16 ENCOUNTER — Other Ambulatory Visit: Payer: Self-pay

## 2019-02-19 ENCOUNTER — Other Ambulatory Visit: Payer: Self-pay

## 2019-02-19 ENCOUNTER — Encounter: Payer: Self-pay | Admitting: Radiation Oncology

## 2019-02-19 ENCOUNTER — Ambulatory Visit
Admission: RE | Admit: 2019-02-19 | Discharge: 2019-02-19 | Disposition: A | Discharge status: Home / Self Care | Payer: Medicare Other | Source: Ambulatory Visit | Attending: Radiation Oncology | Admitting: Radiation Oncology

## 2019-02-19 VITALS — BP 137/79 | HR 98 | Temp 98.7°F | Resp 18 | Wt 164.5 lb

## 2019-02-19 DIAGNOSIS — C771 Secondary and unspecified malignant neoplasm of intrathoracic lymph nodes: Secondary | ICD-10-CM | POA: Insufficient documentation

## 2019-02-19 DIAGNOSIS — Z923 Personal history of irradiation: Secondary | ICD-10-CM | POA: Diagnosis not present

## 2019-02-19 DIAGNOSIS — C3412 Malignant neoplasm of upper lobe, left bronchus or lung: Secondary | ICD-10-CM | POA: Insufficient documentation

## 2019-02-19 NOTE — Progress Notes (Signed)
Radiation Oncology Follow up Note  Name: Steve Gregory   Date:   02/19/2019 MRN:  [id number] DOB: 09-26-1955    This 63 y.o. male presents to the clinic today for follow-up on new.  Left prevascular lymph node in patient with multiple previous treatments.  REFERRING PROVIDER: Kirk Ruths, MD  HPI: Patient is a 63 year old male well-known to our department.  For non-small cell lung cancer recently had follow-up CT scan showing new left prevascular lymph node concerning for malignancy.  I did a PET CT scan showing enlarging hypermetabolic left prevascular lymph node compatible with nodal metastasis.  No other findings suspicious for hypermetabolic metastatic disease.  There was a peripheral left upper lobe pulmonary nodule which we previously treated which was stable.  He continues to have poor lung function no hemoptysis cough  COMPLICATIONS OF TREATMENT: none  FOLLOW UP COMPLIANCE: keeps appointments   PHYSICAL EXAM:  BP 137/79   Pulse 98   Temp 98.7 F (37.1 C)   Resp 18   Wt 164 lb 7.4 oz (74.6 kg)   BMI 24.29 kg/m  Well-developed well-nourished patient in NAD. HEENT reveals PERLA, EOMI, discs not visualized.  Oral cavity is clear. No oral mucosal lesions are identified. Neck is clear without evidence of cervical or supraclavicular adenopathy. Lungs are clear to A&P. Cardiac examination is essentially unremarkable with regular rate and rhythm without murmur rub or thrill. Abdomen is benign with no organomegaly or masses noted. Motor sensory and DTR levels are equal and symmetric in the upper and lower extremities. Cranial nerves II through XII are grossly intact. Proprioception is intact. No peripheral adenopathy or edema is identified. No motor or sensory levels are noted. Crude visual fields are within normal range.  RADIOLOGY RESULTS: PET CT scan reviewed compatible with above-stated findings  PLAN: At this time like to go ahead with SBRT to the new lesion.  We will plan on  delivering 5000 cGy in 5 fractions.  I would use motion restriction protocol as well as 4D treatment planning.  We will also use PET CT fusion study.  Risks and benefits of treatment including cough fatigue possible slight radiation esophagitis secondary to his close proximity to the esophagus all were explained in detail to the patient.  I have personally set up and ordered CT simulation for later this week.  Patient comprehends my treatment plan well.  I would like to take this opportunity to thank you for allowing me to participate in the care of your patient.Noreene Filbert, MD

## 2019-02-20 ENCOUNTER — Other Ambulatory Visit: Payer: Self-pay

## 2019-02-21 ENCOUNTER — Ambulatory Visit
Admission: RE | Admit: 2019-02-21 | Discharge: 2019-02-21 | Disposition: A | Discharge status: Home / Self Care | Payer: Medicare Other | Source: Ambulatory Visit | Attending: Radiation Oncology | Admitting: Radiation Oncology

## 2019-02-21 ENCOUNTER — Other Ambulatory Visit: Payer: Self-pay

## 2019-02-21 DIAGNOSIS — C771 Secondary and unspecified malignant neoplasm of intrathoracic lymph nodes: Secondary | ICD-10-CM | POA: Diagnosis not present

## 2019-02-21 DIAGNOSIS — F17210 Nicotine dependence, cigarettes, uncomplicated: Secondary | ICD-10-CM | POA: Insufficient documentation

## 2019-02-21 DIAGNOSIS — C3411 Malignant neoplasm of upper lobe, right bronchus or lung: Secondary | ICD-10-CM | POA: Insufficient documentation

## 2019-02-21 DIAGNOSIS — Z510 Encounter for antineoplastic radiation therapy: Secondary | ICD-10-CM | POA: Insufficient documentation

## 2019-02-22 DIAGNOSIS — Z510 Encounter for antineoplastic radiation therapy: Secondary | ICD-10-CM | POA: Diagnosis not present

## 2019-03-05 ENCOUNTER — Other Ambulatory Visit: Payer: Self-pay

## 2019-03-05 ENCOUNTER — Ambulatory Visit
Admission: RE | Admit: 2019-03-05 | Discharge: 2019-03-05 | Disposition: A | Discharge status: Home / Self Care | Payer: Medicare Other | Source: Ambulatory Visit | Attending: Radiation Oncology | Admitting: Radiation Oncology

## 2019-03-05 DIAGNOSIS — F17210 Nicotine dependence, cigarettes, uncomplicated: Secondary | ICD-10-CM | POA: Diagnosis not present

## 2019-03-05 DIAGNOSIS — C3411 Malignant neoplasm of upper lobe, right bronchus or lung: Secondary | ICD-10-CM | POA: Insufficient documentation

## 2019-03-05 DIAGNOSIS — Z510 Encounter for antineoplastic radiation therapy: Secondary | ICD-10-CM | POA: Diagnosis present

## 2019-03-05 DIAGNOSIS — C771 Secondary and unspecified malignant neoplasm of intrathoracic lymph nodes: Secondary | ICD-10-CM | POA: Insufficient documentation

## 2019-03-07 ENCOUNTER — Other Ambulatory Visit: Payer: Self-pay

## 2019-03-07 ENCOUNTER — Ambulatory Visit
Admission: RE | Admit: 2019-03-07 | Discharge: 2019-03-07 | Disposition: A | Discharge status: Home / Self Care | Payer: Medicare Other | Source: Ambulatory Visit | Attending: Radiation Oncology | Admitting: Radiation Oncology

## 2019-03-07 DIAGNOSIS — Z510 Encounter for antineoplastic radiation therapy: Secondary | ICD-10-CM | POA: Diagnosis not present

## 2019-03-12 ENCOUNTER — Ambulatory Visit
Admission: RE | Admit: 2019-03-12 | Discharge: 2019-03-12 | Disposition: A | Discharge status: Home / Self Care | Payer: Medicare Other | Source: Ambulatory Visit | Attending: Radiation Oncology | Admitting: Radiation Oncology

## 2019-03-12 ENCOUNTER — Other Ambulatory Visit: Payer: Self-pay

## 2019-03-12 DIAGNOSIS — Z510 Encounter for antineoplastic radiation therapy: Secondary | ICD-10-CM | POA: Diagnosis not present

## 2019-03-14 ENCOUNTER — Other Ambulatory Visit: Payer: Self-pay

## 2019-03-14 ENCOUNTER — Ambulatory Visit
Admission: RE | Admit: 2019-03-14 | Discharge: 2019-03-14 | Disposition: A | Discharge status: Home / Self Care | Payer: Medicare Other | Source: Ambulatory Visit | Attending: Radiation Oncology | Admitting: Radiation Oncology

## 2019-03-14 DIAGNOSIS — Z510 Encounter for antineoplastic radiation therapy: Secondary | ICD-10-CM | POA: Diagnosis not present

## 2019-03-19 ENCOUNTER — Ambulatory Visit
Admission: RE | Admit: 2019-03-19 | Discharge: 2019-03-19 | Disposition: A | Discharge status: Home / Self Care | Payer: Medicare Other | Source: Ambulatory Visit | Attending: Radiation Oncology | Admitting: Radiation Oncology

## 2019-03-19 ENCOUNTER — Other Ambulatory Visit: Payer: Self-pay

## 2019-03-19 DIAGNOSIS — Z510 Encounter for antineoplastic radiation therapy: Secondary | ICD-10-CM | POA: Diagnosis not present

## 2019-04-23 ENCOUNTER — Ambulatory Visit: Payer: Medicare Other | Admitting: Radiation Oncology

## 2019-04-27 ENCOUNTER — Other Ambulatory Visit: Payer: Self-pay

## 2019-04-30 ENCOUNTER — Other Ambulatory Visit: Payer: Self-pay | Admitting: *Deleted

## 2019-04-30 ENCOUNTER — Ambulatory Visit
Admission: RE | Admit: 2019-04-30 | Discharge: 2019-04-30 | Disposition: A | Discharge status: Home / Self Care | Payer: Medicare Other | Source: Ambulatory Visit | Attending: Radiation Oncology | Admitting: Radiation Oncology

## 2019-04-30 ENCOUNTER — Other Ambulatory Visit: Payer: Self-pay

## 2019-04-30 ENCOUNTER — Encounter: Payer: Self-pay | Admitting: Radiation Oncology

## 2019-04-30 VITALS — BP 138/73 | HR 88 | Temp 98.4°F | Resp 20 | Wt 160.2 lb

## 2019-04-30 DIAGNOSIS — C3411 Malignant neoplasm of upper lobe, right bronchus or lung: Secondary | ICD-10-CM | POA: Diagnosis not present

## 2019-04-30 DIAGNOSIS — C3412 Malignant neoplasm of upper lobe, left bronchus or lung: Secondary | ICD-10-CM

## 2019-04-30 DIAGNOSIS — F17210 Nicotine dependence, cigarettes, uncomplicated: Secondary | ICD-10-CM | POA: Insufficient documentation

## 2019-04-30 DIAGNOSIS — C771 Secondary and unspecified malignant neoplasm of intrathoracic lymph nodes: Secondary | ICD-10-CM | POA: Insufficient documentation

## 2019-04-30 DIAGNOSIS — Z923 Personal history of irradiation: Secondary | ICD-10-CM | POA: Diagnosis not present

## 2019-04-30 NOTE — Progress Notes (Signed)
Radiation Oncology Follow up Note  Name: Steve Gregory   Date:   04/30/2019 MRN:  [id number] DOB: Oct 12, 1955    This 63 y.o. male presents to the clinic today for 1 month follow-up status post SBRT for progressive prevascular lymph node in patient with known small cell lung cancer.  REFERRING PROVIDER: Kirk Ruths, MD  HPI: Patient is 1 month out having completed SBRT for a progressive hypermetabolic new left prevascular lymph node concerning for malignancy.  Seen today in routine follow-up he is stable he specifically denies hemoptysis chest tightness does have a mild nonproductive cough.  He is having no dysphasia at this time.  He has had no new imaging..  COMPLICATIONS OF TREATMENT: none  FOLLOW UP COMPLIANCE: keeps appointments   PHYSICAL EXAM:  BP 138/73 (BP Location: Left Arm, Patient Position: Sitting)   Pulse 88   Temp 98.4 F (36.9 C) (Tympanic)   Resp 20   Wt 160 lb 3.2 oz (72.7 kg)   BMI 23.66 kg/m  Well-developed well-nourished patient in NAD. HEENT reveals PERLA, EOMI, discs not visualized.  Oral cavity is clear. No oral mucosal lesions are identified. Neck is clear without evidence of cervical or supraclavicular adenopathy. Lungs are clear to A&P. Cardiac examination is essentially unremarkable with regular rate and rhythm without murmur rub or thrill. Abdomen is benign with no organomegaly or masses noted. Motor sensory and DTR levels are equal and symmetric in the upper and lower extremities. Cranial nerves II through XII are grossly intact. Proprioception is intact. No peripheral adenopathy or edema is identified. No motor or sensory levels are noted. Crude visual fields are within normal range.  RADIOLOGY RESULTS: CT scan ordered for November  PLAN: Present time patient is doing well his pulmonary functions appear stable after SBRT.  I have asked to see him back in 3 months with a CT scan of the chest with contrast prior to that visit.  Patient knows to call  sooner with any concerns.  I would like to take this opportunity to thank you for allowing me to participate in the care of your patient.Noreene Filbert, MD

## 2019-07-12 DIAGNOSIS — T466X5A Adverse effect of antihyperlipidemic and antiarteriosclerotic drugs, initial encounter: Secondary | ICD-10-CM | POA: Insufficient documentation

## 2019-07-24 ENCOUNTER — Other Ambulatory Visit: Payer: Self-pay

## 2019-07-24 ENCOUNTER — Ambulatory Visit
Admission: RE | Admit: 2019-07-24 | Discharge: 2019-07-24 | Disposition: A | Discharge status: Home / Self Care | Payer: Medicare Other | Source: Ambulatory Visit | Attending: Radiation Oncology | Admitting: Radiation Oncology

## 2019-07-24 DIAGNOSIS — C3412 Malignant neoplasm of upper lobe, left bronchus or lung: Secondary | ICD-10-CM | POA: Diagnosis present

## 2019-07-24 MED ORDER — IOHEXOL 300 MG/ML  SOLN
75.0000 mL | Freq: Once | INTRAMUSCULAR | Status: AC | PRN
  Administered 2019-07-24: 14:00:00 75 mL via INTRAVENOUS

## 2019-07-25 ENCOUNTER — Ambulatory Visit: Admit: 2019-07-25 | Payer: Medicare Other | Admitting: Internal Medicine

## 2019-07-25 SURGERY — ESOPHAGOGASTRODUODENOSCOPY (EGD) WITH PROPOFOL
Anesthesia: General

## 2019-07-30 ENCOUNTER — Ambulatory Visit
Admission: RE | Admit: 2019-07-30 | Discharge: 2019-07-30 | Disposition: A | Discharge status: Home / Self Care | Payer: Medicare Other | Source: Ambulatory Visit | Attending: Radiation Oncology | Admitting: Radiation Oncology

## 2019-07-30 ENCOUNTER — Other Ambulatory Visit: Payer: Self-pay

## 2019-07-30 ENCOUNTER — Other Ambulatory Visit: Payer: Self-pay | Admitting: *Deleted

## 2019-07-30 ENCOUNTER — Encounter: Payer: Self-pay | Admitting: Radiation Oncology

## 2019-07-30 DIAGNOSIS — C771 Secondary and unspecified malignant neoplasm of intrathoracic lymph nodes: Secondary | ICD-10-CM | POA: Diagnosis not present

## 2019-07-30 DIAGNOSIS — C3412 Malignant neoplasm of upper lobe, left bronchus or lung: Secondary | ICD-10-CM | POA: Diagnosis present

## 2019-07-30 DIAGNOSIS — Z923 Personal history of irradiation: Secondary | ICD-10-CM | POA: Insufficient documentation

## 2019-07-30 DIAGNOSIS — R0789 Other chest pain: Secondary | ICD-10-CM | POA: Insufficient documentation

## 2019-07-30 NOTE — Progress Notes (Signed)
Radiation Oncology Follow up Note  Name: Steve Gregory   Date:   07/30/2019 MRN:  [id number] DOB: [birth year]    This 63 y.o. male presents to the clinic today for 15-month follow-up status post SBRT for progressive prevascular lymph node and patient with known small cell lung cancer  REFERRING PROVIDER: Kirk Ruths, MD  HPI: Patient is a 63 year old male now out 4 months having completed SBRT for progressive hypermetabolic new left prevascular lymph node concerning for malignancy and patient with known small cell lung cancer.  Seen today in routine follow-up he is doing well.  He states he has some chest tightness.  Specifically denies cough hemoptysis.  Recent chest CT last week showed the prevascular node decreased in size by 50%.  Unchanged posterior posttreatment appearance of peripheral nodule left upper lobe and stable enlarged right hilar lymph nodes.  COMPLICATIONS OF TREATMENT: none  FOLLOW UP COMPLIANCE: keeps appointments   PHYSICAL EXAM:  BP (!) (P) 143/79 (BP Location: Left Arm, Patient Position: Sitting)   Pulse (P) 89   Temp (P) 98.4 F (36.9 C) (Tympanic)   Resp (P) 18   Wt (P) 161 lb (73 kg)   BMI (P) 23.78 kg/m  Well-developed well-nourished patient in NAD. HEENT reveals PERLA, EOMI, discs not visualized.  Oral cavity is clear. No oral mucosal lesions are identified. Neck is clear without evidence of cervical or supraclavicular adenopathy. Lungs are clear to A&P. Cardiac examination is essentially unremarkable with regular rate and rhythm without murmur rub or thrill. Abdomen is benign with no organomegaly or masses noted. Motor sensory and DTR levels are equal and symmetric in the upper and lower extremities. Cranial nerves II through XII are grossly intact. Proprioception is intact. No peripheral adenopathy or edema is identified. No motor or sensory levels are noted. Crude visual fields are within normal range.  RADIOLOGY RESULTS: CT scans reviewed compatible  with above-stated findings  PLAN: Present time patient is doing well with CT scan showing stable disease in his chest upon my review.  I am pleased with his overall progress.  I have asked to see him at about 5 months for follow-up hopefully with a repeat CT scan at that time.  Patient continues follow-up care with medical oncology.  Patient knows to call with any concerns.  I would like to take this opportunity to thank you for allowing me to participate in the care of your patient.Noreene Filbert, MD

## 2019-07-31 ENCOUNTER — Other Ambulatory Visit
Admission: RE | Admit: 2019-07-31 | Discharge: 2019-07-31 | Disposition: A | Discharge status: Home / Self Care | Payer: Medicare Other | Source: Ambulatory Visit | Attending: General Surgery | Admitting: General Surgery

## 2019-07-31 DIAGNOSIS — Z20828 Contact with and (suspected) exposure to other viral communicable diseases: Secondary | ICD-10-CM | POA: Insufficient documentation

## 2019-07-31 DIAGNOSIS — Z01812 Encounter for preprocedural laboratory examination: Secondary | ICD-10-CM | POA: Diagnosis present

## 2019-07-31 LAB — SARS CORONAVIRUS 2 (TAT 6-24 HRS): SARS Coronavirus 2: NEGATIVE

## 2019-08-02 ENCOUNTER — Encounter: Payer: Self-pay | Admitting: *Deleted

## 2019-11-12 ENCOUNTER — Other Ambulatory Visit: Admission: RE | Admit: 2019-11-12 | Payer: Medicare Other | Source: Ambulatory Visit

## 2019-11-12 ENCOUNTER — Other Ambulatory Visit: Payer: Self-pay

## 2019-11-12 ENCOUNTER — Emergency Department: Payer: Medicare Other

## 2019-11-12 ENCOUNTER — Observation Stay
Admission: EM | Admit: 2019-11-12 | Discharge: 2019-11-13 | Disposition: A | Discharge status: Home / Self Care | Payer: Medicare Other | Attending: Internal Medicine | Admitting: Internal Medicine

## 2019-11-12 ENCOUNTER — Encounter: Payer: Self-pay | Admitting: Emergency Medicine

## 2019-11-12 DIAGNOSIS — G894 Chronic pain syndrome: Secondary | ICD-10-CM | POA: Insufficient documentation

## 2019-11-12 DIAGNOSIS — J439 Emphysema, unspecified: Secondary | ICD-10-CM | POA: Diagnosis not present

## 2019-11-12 DIAGNOSIS — Z7982 Long term (current) use of aspirin: Secondary | ICD-10-CM | POA: Insufficient documentation

## 2019-11-12 DIAGNOSIS — Z79899 Other long term (current) drug therapy: Secondary | ICD-10-CM | POA: Diagnosis not present

## 2019-11-12 DIAGNOSIS — Z7952 Long term (current) use of systemic steroids: Secondary | ICD-10-CM | POA: Diagnosis not present

## 2019-11-12 DIAGNOSIS — I700 Atherosclerosis of aorta: Secondary | ICD-10-CM | POA: Insufficient documentation

## 2019-11-12 DIAGNOSIS — J441 Chronic obstructive pulmonary disease with (acute) exacerbation: Secondary | ICD-10-CM | POA: Diagnosis present

## 2019-11-12 DIAGNOSIS — F17210 Nicotine dependence, cigarettes, uncomplicated: Secondary | ICD-10-CM | POA: Insufficient documentation

## 2019-11-12 DIAGNOSIS — I10 Essential (primary) hypertension: Secondary | ICD-10-CM | POA: Diagnosis not present

## 2019-11-12 DIAGNOSIS — Z923 Personal history of irradiation: Secondary | ICD-10-CM | POA: Diagnosis not present

## 2019-11-12 DIAGNOSIS — M1990 Unspecified osteoarthritis, unspecified site: Secondary | ICD-10-CM | POA: Diagnosis not present

## 2019-11-12 DIAGNOSIS — Z7951 Long term (current) use of inhaled steroids: Secondary | ICD-10-CM | POA: Insufficient documentation

## 2019-11-12 DIAGNOSIS — M48061 Spinal stenosis, lumbar region without neurogenic claudication: Secondary | ICD-10-CM | POA: Insufficient documentation

## 2019-11-12 DIAGNOSIS — Z85118 Personal history of other malignant neoplasm of bronchus and lung: Secondary | ICD-10-CM | POA: Diagnosis not present

## 2019-11-12 DIAGNOSIS — I739 Peripheral vascular disease, unspecified: Secondary | ICD-10-CM | POA: Insufficient documentation

## 2019-11-12 DIAGNOSIS — Z8719 Personal history of other diseases of the digestive system: Secondary | ICD-10-CM | POA: Diagnosis not present

## 2019-11-12 DIAGNOSIS — M5136 Other intervertebral disc degeneration, lumbar region: Secondary | ICD-10-CM | POA: Diagnosis not present

## 2019-11-12 DIAGNOSIS — K219 Gastro-esophageal reflux disease without esophagitis: Secondary | ICD-10-CM | POA: Diagnosis not present

## 2019-11-12 DIAGNOSIS — Z720 Tobacco use: Secondary | ICD-10-CM | POA: Diagnosis not present

## 2019-11-12 DIAGNOSIS — J440 Chronic obstructive pulmonary disease with acute lower respiratory infection: Secondary | ICD-10-CM | POA: Diagnosis not present

## 2019-11-12 DIAGNOSIS — Z8249 Family history of ischemic heart disease and other diseases of the circulatory system: Secondary | ICD-10-CM | POA: Insufficient documentation

## 2019-11-12 DIAGNOSIS — J209 Acute bronchitis, unspecified: Secondary | ICD-10-CM

## 2019-11-12 DIAGNOSIS — Z20822 Contact with and (suspected) exposure to covid-19: Secondary | ICD-10-CM | POA: Insufficient documentation

## 2019-11-12 DIAGNOSIS — Z888 Allergy status to other drugs, medicaments and biological substances status: Secondary | ICD-10-CM | POA: Insufficient documentation

## 2019-11-12 LAB — COMPREHENSIVE METABOLIC PANEL
ALT: 12 U/L (ref 0–44)
AST: 23 U/L (ref 15–41)
Albumin: 3.9 g/dL (ref 3.5–5.0)
Alkaline Phosphatase: 77 U/L (ref 38–126)
Anion gap: 8 (ref 5–15)
BUN: 11 mg/dL (ref 8–23)
CO2: 26 mmol/L (ref 22–32)
Calcium: 9.1 mg/dL (ref 8.9–10.3)
Chloride: 104 mmol/L (ref 98–111)
Creatinine, Ser: 1.13 mg/dL (ref 0.61–1.24)
GFR calc Af Amer: >60 mL/min (ref >60)
GFR calc non Af Amer: >60 mL/min (ref >60)
Glucose, Bld: 199 mg/dL — ABNORMAL HIGH (ref 70–99)
Potassium: 3.7 mmol/L (ref 3.5–5.1)
Sodium: 138 mmol/L (ref 135–145)
Total Bilirubin: 0.7 mg/dL (ref 0.3–1.2)
Total Protein: 6.9 g/dL (ref 6.5–8.1)

## 2019-11-12 LAB — CBC WITH DIFFERENTIAL/PLATELET
Abs Immature Granulocytes: 0.13 10*3/uL — ABNORMAL HIGH (ref 0.00–0.07)
Basophils Absolute: 0.1 10*3/uL (ref 0.0–0.1)
Basophils Relative: 0 %
Eosinophils Absolute: 0 10*3/uL (ref 0.0–0.5)
Eosinophils Relative: 0 %
HCT: 43.5 % (ref 39.0–52.0)
Hemoglobin: 14.1 g/dL (ref 13.0–17.0)
Immature Granulocytes: 1 %
Lymphocytes Relative: 8 %
Lymphs Abs: 1.1 10*3/uL (ref 0.7–4.0)
MCH: 30.4 pg (ref 26.0–34.0)
MCHC: 32.4 g/dL (ref 30.0–36.0)
MCV: 93.8 fL (ref 80.0–100.0)
Monocytes Absolute: 0.5 10*3/uL (ref 0.1–1.0)
Monocytes Relative: 3 %
Neutro Abs: 11.7 10*3/uL — ABNORMAL HIGH (ref 1.7–7.7)
Neutrophils Relative %: 88 %
Platelets: 281 10*3/uL (ref 150–400)
RBC: 4.64 MIL/uL (ref 4.22–5.81)
RDW: 14.6 % (ref 11.5–15.5)
WBC: 13.5 10*3/uL — ABNORMAL HIGH (ref 4.0–10.5)
nRBC: 0 % (ref 0.0–0.2)

## 2019-11-12 LAB — POC SARS CORONAVIRUS 2 AG: SARS Coronavirus 2 Ag: NEGATIVE

## 2019-11-12 LAB — LACTIC ACID, PLASMA
Lactic Acid, Venous: 4.9 mmol/L (ref 0.5–1.9)
Lactic Acid, Venous: 1.6 mmol/L (ref 0.5–1.9)

## 2019-11-12 LAB — MAGNESIUM: Magnesium: 2 mg/dL (ref 1.7–2.4)

## 2019-11-12 LAB — PROCALCITONIN: Procalcitonin: <0.1 ng/mL

## 2019-11-12 LAB — TROPONIN I (HIGH SENSITIVITY)
Troponin I (High Sensitivity): 3 ng/L (ref <18)
Troponin I (High Sensitivity): <2 ng/L (ref <18)

## 2019-11-12 MED ORDER — KETOROLAC TROMETHAMINE 30 MG/ML IJ SOLN
15.0000 mg | Freq: Once | INTRAMUSCULAR | Status: AC
  Administered 2019-11-12: 15 mg via INTRAVENOUS
  Filled 2019-11-12: qty 1

## 2019-11-12 MED ORDER — ENOXAPARIN SODIUM 40 MG/0.4ML ~~LOC~~ SOLN
40.0000 mg | SUBCUTANEOUS | Status: DC
  Administered 2019-11-12: 40 mg via SUBCUTANEOUS
  Filled 2019-11-12: qty 0.4

## 2019-11-12 MED ORDER — SODIUM CHLORIDE 0.9 % IV SOLN
500.0000 mg | INTRAVENOUS | Status: DC
  Filled 2019-11-12: qty 500

## 2019-11-12 MED ORDER — SODIUM CHLORIDE 0.9 % IV SOLN
2.0000 g | Freq: Once | INTRAVENOUS | Status: AC
  Administered 2019-11-12: 18:00:00 2 g via INTRAVENOUS
  Filled 2019-11-12: qty 20

## 2019-11-12 MED ORDER — MORPHINE SULFATE (PF) 4 MG/ML IV SOLN
4.0000 mg | Freq: Once | INTRAVENOUS | Status: AC
  Administered 2019-11-12: 4 mg via INTRAVENOUS
  Filled 2019-11-12: qty 1

## 2019-11-12 MED ORDER — SODIUM CHLORIDE 0.9 % IV SOLN: INTRAVENOUS | Status: DC

## 2019-11-12 MED ORDER — MAGNESIUM HYDROXIDE 400 MG/5ML PO SUSP
30.0000 mL | Freq: Every day | ORAL | Status: DC | PRN
  Filled 2019-11-12: qty 30

## 2019-11-12 MED ORDER — LABETALOL HCL 5 MG/ML IV SOLN: 20.0000 mg | INTRAVENOUS | Status: DC | PRN

## 2019-11-12 MED ORDER — SODIUM CHLORIDE 0.9 % IV SOLN
1.0000 g | INTRAVENOUS | Status: DC
  Filled 2019-11-12: qty 10

## 2019-11-12 MED ORDER — ONDANSETRON HCL 4 MG/2ML IJ SOLN: 4.0000 mg | Freq: Four times a day (QID) | INTRAMUSCULAR | Status: DC | PRN

## 2019-11-12 MED ORDER — ACETAMINOPHEN 650 MG RE SUPP: 650.0000 mg | Freq: Four times a day (QID) | RECTAL | Status: DC | PRN

## 2019-11-12 MED ORDER — ONDANSETRON HCL 4 MG/2ML IJ SOLN
4.0000 mg | Freq: Once | INTRAMUSCULAR | Status: AC
  Administered 2019-11-12: 20:00:00 4 mg via INTRAVENOUS
  Filled 2019-11-12: qty 2

## 2019-11-12 MED ORDER — ONDANSETRON HCL 4 MG PO TABS: 4.0000 mg | ORAL_TABLET | Freq: Four times a day (QID) | ORAL | Status: DC | PRN

## 2019-11-12 MED ORDER — GUAIFENESIN ER 600 MG PO TB12
600.0000 mg | ORAL_TABLET | Freq: Two times a day (BID) | ORAL | Status: DC
  Administered 2019-11-12: 600 mg via ORAL
  Filled 2019-11-12 (×3): qty 1

## 2019-11-12 MED ORDER — METHYLPREDNISOLONE SODIUM SUCC 40 MG IJ SOLR
40.0000 mg | Freq: Three times a day (TID) | INTRAMUSCULAR | Status: DC
  Administered 2019-11-13: 06:00:00 40 mg via INTRAVENOUS
  Filled 2019-11-12: qty 1

## 2019-11-12 MED ORDER — TRAZODONE HCL 50 MG PO TABS: 25.0000 mg | ORAL_TABLET | Freq: Every evening | ORAL | Status: DC | PRN

## 2019-11-12 MED ORDER — ACETAMINOPHEN 325 MG PO TABS
650.0000 mg | ORAL_TABLET | Freq: Four times a day (QID) | ORAL | Status: DC | PRN
  Administered 2019-11-13: 650 mg via ORAL
  Filled 2019-11-12: qty 2

## 2019-11-12 MED ORDER — IPRATROPIUM-ALBUTEROL 0.5-2.5 (3) MG/3ML IN SOLN
3.0000 mL | Freq: Four times a day (QID) | RESPIRATORY_TRACT | Status: DC
  Filled 2019-11-12 (×2): qty 3

## 2019-11-12 MED ORDER — ALBUTEROL SULFATE (2.5 MG/3ML) 0.083% IN NEBU
5.0000 mg | INHALATION_SOLUTION | Freq: Once | RESPIRATORY_TRACT | Status: AC
  Administered 2019-11-12: 20:00:00 5 mg via RESPIRATORY_TRACT
  Filled 2019-11-12: qty 6

## 2019-11-12 MED ORDER — METHYLPREDNISOLONE SODIUM SUCC 125 MG IJ SOLR
125.0000 mg | Freq: Once | INTRAMUSCULAR | Status: AC
  Administered 2019-11-12: 125 mg via INTRAVENOUS
  Filled 2019-11-12: qty 2

## 2019-11-12 MED ORDER — HYDROCOD POLST-CPM POLST ER 10-8 MG/5ML PO SUER
5.0000 mL | Freq: Two times a day (BID) | ORAL | Status: DC | PRN
  Administered 2019-11-13: 5 mL via ORAL
  Filled 2019-11-12: qty 5

## 2019-11-12 MED ORDER — SODIUM CHLORIDE 0.9 % IV BOLUS
1000.0000 mL | Freq: Once | INTRAVENOUS | Status: AC
  Administered 2019-11-12: 18:00:00 1000 mL via INTRAVENOUS

## 2019-11-12 MED ORDER — IOHEXOL 350 MG/ML SOLN
75.0000 mL | Freq: Once | INTRAVENOUS | Status: AC | PRN
  Administered 2019-11-12: 19:00:00 75 mL via INTRAVENOUS

## 2019-11-12 MED ORDER — SODIUM CHLORIDE 0.9 % IV SOLN
500.0000 mg | Freq: Once | INTRAVENOUS | Status: AC
  Administered 2019-11-12: 20:00:00 500 mg via INTRAVENOUS
  Filled 2019-11-12: qty 500

## 2019-11-12 NOTE — Consult Note (Signed)
CODE SEPSIS - PHARMACY COMMUNICATION  **Broad Spectrum Antibiotics should be administered within 1 hour of Sepsis diagnosis**  Time Code Sepsis Called/Page Received: 1731  Antibiotics Ordered: ceftriaxone and azithromycin  Time of 1st antibiotic administration: 1817  Additional action taken by pharmacy: none required  If necessary, Name of Provider/Nurse Contacted: N/A  Dallie Piles ,PharmD Clinical Pharmacist  11/12/2019  6:42 PM

## 2019-11-12 NOTE — ED Provider Notes (Addendum)
[facility] Emergency Department Provider Note  ____________________________________________   First MD Initiated Contact with Patient 11/12/19 1725     (approximate)  I have reviewed the triage vital signs and the nursing notes.   HISTORY  Chief Complaint Chest Pain    HPI Steve Gregory is a 64 y.o. male  With h/o lung cancer, COPD, here with SOB and cough. Pt reports that for the past 3-4 weeks, he's had persistently worsening cough, SOB, and left-sided CP. He was seen recently and started on ABX, prednisone increase without significant relief. He has subsequently developed worsening cough, as well as a sharp, stabbing left-sided CP that is worse with inspiration, movement. He's having extreme difficulty moving aroud 2/2 his dyspnea. No leg swelling, no h/o DVT/PE. No alleviating factors.        Past Medical History:  Diagnosis Date  . Allergy   . Anxiety   . Arthritis   . Barrett's esophagus   . Bronchitis, chronic ([place])   . Cancer (HCC)    LUNG  . COPD (chronic obstructive pulmonary disease) ([place])   . Cough    CHRONIC  . Depression   . Dizziness   . Dysphagia   . Essential hypertension 06/28/2017  . GERD (gastroesophageal reflux disease)   . H/O emphysema   . Headache   . History of depression 06/05/2014   Last Assessment & Plan:  Mood is doing well on medications.   . History of hiatal hernia   . HOH (hard of hearing)   . Hypertension   . Lung mass   . Maxillary fracture (HCC)    LEFT SIDE  . Orthopnea   . Oxygen decrease    H/O HOME USE, NOT NOW  . Personal history of colonic polyps   . Pneumonia    PSEUDOMONAL PNEUMONIA IN THE PAST  . PVD (peripheral vascular disease) ([place]) 06/05/2014   Overview:  Smoker with poor pulses  Last Assessment & Plan:  No leg pain is noted of late.   . Renal failure    PRERENAL  . Seizures (HCC)    sounds like it could be vagal at times, coughing can cause him to fade out and see white lights. had  last sz 3 months ago when not taking meds  . Shortness of breath dyspnea    uses many different inhalers  . Varicose veins of both lower extremities with pain 06/28/2017  . Wheezing     Patient Active Problem List   Diagnosis Date Noted  . COPD exacerbation ([place]) 11/12/2019  . Goals of care, counseling/discussion 08/21/2018  . Neurogenic pain 07/10/2018  . Chronic pain syndrome 07/10/2018  . Vitamin D insufficiency 04/18/2018  . Pharmacologic therapy 04/11/2018  . Disorder of skeletal system 04/11/2018  . Problems influencing health status 04/11/2018  . 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 Lumbosacral radiculopathy at S1 01/03/2018  . Chronic bilateral low back pain with bilateral sciatica 01/03/2018  . Lumbar degenerative disc disease 01/03/2018  . Cervicalgia 01/03/2018  . Lung mass 07/11/2017  . Lower extremity pain, bilateral 06/28/2017  . Varicose veins of both lower extremities with pain 06/28/2017  . Tobacco dependence 06/28/2017  . Hyperglycemia 03/07/2017  . Healthcare maintenance 11/04/2016  . History of depression 06/05/2014  . PVD (peripheral vascular disease) ([place]) 06/05/2014  . HTN (hypertension), benign 02/22/2014  . GERD (gastroesophageal reflux disease) 02/22/2014  . COPD (chronic obstructive pulmonary disease) with emphysema ([place]) 02/21/2014  Past Surgical History:  Procedure Laterality Date  . CATARACT EXTRACTION W/PHACO Right 10/27/2015   Procedure: CATARACT EXTRACTION PHACO AND INTRAOCULAR LENS PLACEMENT ([place]) suture placed in right eye at end of procedure;  Surgeon: Estill Cotta, MD;  Location: [facility] ORS;  Service: Ophthalmology;  Laterality: Right;  [place] 01:02 AP% 24.4 CDE 28.12 fluid pack lot # [id number]  . CATARACT EXTRACTION W/PHACO Left 11/17/2015   Procedure: CATARACT EXTRACTION PHACO AND INTRAOCULAR LENS PLACEMENT ([place]);  Surgeon: Estill Cotta, MD;  Location: [facility] ORS;  Service: Ophthalmology;   Laterality: Left;  [place] 01:29 AP% 24.3 CDE 40.05 fluid pack lot # [id number]  . COLONOSCOPY WITH PROPOFOL N/A 07/28/2017   Procedure: COLONOSCOPY WITH PROPOFOL;  Surgeon: [name], MD;  Location: [facility] ENDOSCOPY;  Service: Endoscopy;  Laterality: N/A;  . ESOPHAGOGASTRODUODENOSCOPY (EGD) WITH PROPOFOL N/A 06/06/2017   Procedure: ESOPHAGOGASTRODUODENOSCOPY (EGD) WITH PROPOFOL;  Surgeon: [name], MD;  Location: [facility] ENDOSCOPY;  Service: Endoscopy;  Laterality: N/A;  . EYE SURGERY    . [place]   LEFT CHEEK BONE INJURY 1980    Prior to Admission medications   Medication Sig Start Date End Date Taking? Authorizing Provider  albuterol (PROVENTIL) (2.5 MG/3ML) 0.083% nebulizer solution Take 2.5 mg by nebulization every 6 (six) hours as needed for wheezing or shortness of breath.   Yes [provider]  amoxicillin-clavulanate (AUGMENTIN) 500-125 MG tablet Take 1 tablet by mouth 2 (two) times daily. 11/06/19 11/16/19 Yes [provider]  famotidine (PEPCID) 20 MG tablet Take 20 mg by mouth 2 (two) times daily. 04/09/19  Yes [provider]  fluticasone (FLONASE) 50 MCG/ACT nasal spray Place 2 sprays into both nostrils daily.   Yes [provider]  fluticasone-salmeterol (ADVAIR HFA) 115-21 MCG/ACT inhaler Inhale 2 puffs into the lungs 2 (two) times daily.   Yes [provider]  losartan (COZAAR) 50 MG tablet Take 50 mg by mouth daily.    Yes [provider]  montelukast (SINGULAIR) 10 MG tablet Take 10 mg by mouth daily.    Yes [provider]  predniSONE (DELTASONE) 10 MG tablet Take 10 mg by mouth daily with breakfast.    Yes [provider]  theophylline (UNIPHYL) 400 MG 24 hr tablet Take 400 mg by mouth daily.   Yes [provider]  VENTOLIN HFA 108 (90 Base) MCG/ACT inhaler Inhale 2 puffs into the lungs every 6 (six) hours as needed for wheezing or shortness of breath.    Yes  [provider]    Allergies Chantix [varenicline], Lipitor [atorvastatin], Mobic [meloxicam], and Statins  Family History  Problem Relation Age of Onset  . Hyperlipidemia Mother   . Stroke Father   . Aneurysm Father   . Heart disease Brother   . Hypertension Brother     Social History Social History   Tobacco Use  . Smoking status: Current Every Day Smoker    Packs/day: 1.00    Years: 43.00    Pack years: 43.00    Types: Cigarettes  . Smokeless tobacco: Never Used  Substance Use Topics  . Alcohol use: No  . Drug use: No    Review of Systems  Review of Systems  Constitutional: Positive for fatigue. Negative for chills and fever.  HENT: Negative for sore throat.   Respiratory: Positive for cough, shortness of breath and wheezing.   Cardiovascular: Positive for chest pain.  Gastrointestinal: Negative for abdominal pain.  Genitourinary: Negative for flank pain.  Musculoskeletal: Negative  for neck pain.  Skin: Negative for rash and wound.  Allergic/Immunologic: Negative for immunocompromised state.  Neurological: Positive for weakness. Negative for numbness.  Hematological: Does not bruise/bleed easily.  All other systems reviewed and are negative.    ____________________________________________  PHYSICAL EXAM:      VITAL SIGNS: ED Triage Vitals  Enc Vitals Group     BP 11/12/19 1454 (!) 153/67     Pulse Rate 11/12/19 1454 100     Resp 11/12/19 1454 18     Temp 11/12/19 1454 98 F (36.7 C)     Temp Source 11/12/19 1454 Oral     SpO2 11/12/19 1454 98 %     Weight 11/12/19 1448 166 lb (75.3 kg)     Height 11/12/19 1448 5\' 10"  (1.778 m)     Head Circumference --      Peak Flow --      Pain Score 11/12/19 1448 7     Pain Loc --      Pain Edu? --      Excl. in [place]? --      Physical Exam Vitals and nursing note reviewed.  Constitutional:      General: He is not in acute distress.    Appearance: He is well-developed.  HENT:     Head:  Normocephalic and atraumatic.  Eyes:     Conjunctiva/sclera: Conjunctivae normal.  Cardiovascular:     Rate and Rhythm: Regular rhythm. Tachycardia present.     Heart sounds: Normal heart sounds. No murmur. No friction rub.  Pulmonary:     Effort: Tachypnea and accessory muscle usage present. No respiratory distress.     Breath sounds: Decreased air movement present. Examination of the right-upper field reveals wheezing. Examination of the left-upper field reveals wheezing. Examination of the right-middle field reveals wheezing. Examination of the left-middle field reveals wheezing. Examination of the right-lower field reveals wheezing. Examination of the left-lower field reveals wheezing. Wheezing present. No rales.  Abdominal:     General: There is no distension.     Palpations: Abdomen is soft.     Tenderness: There is no abdominal tenderness.  Musculoskeletal:     Cervical back: Neck supple.  Skin:    General: Skin is warm.     Capillary Refill: Capillary refill takes less than 2 seconds.  Neurological:     Mental Status: He is alert and oriented to person, place, and time.     Motor: No abnormal muscle tone.       ____________________________________________   LABS (all labs ordered are listed, but only abnormal results are displayed)  Labs Reviewed  LACTIC ACID, PLASMA - Abnormal; Notable for the following components:      Result Value   Lactic Acid, Venous 4.9 (*)    All other components within normal limits  COMPREHENSIVE METABOLIC PANEL - Abnormal; Notable for the following components:   Glucose, Bld 199 (*)    All other components within normal limits  CBC WITH DIFFERENTIAL/PLATELET - Abnormal; Notable for the following components:   WBC 13.5 (*)    Neutro Abs 11.7 (*)    Abs Immature Granulocytes 0.13 (*)    All other components within normal limits  CULTURE, BLOOD (ROUTINE X 2)  CULTURE, BLOOD (ROUTINE X 2)  SARS CORONAVIRUS 2 (TAT 6-24 HRS)  LACTIC ACID,  PLASMA  PROCALCITONIN  MAGNESIUM  PROCALCITONIN  BASIC METABOLIC PANEL  CBC  POC SARS CORONAVIRUS 2 AG -  ED  POC SARS CORONAVIRUS 2 AG  TROPONIN I (HIGH SENSITIVITY)  TROPONIN I (HIGH SENSITIVITY)    ____________________________________________  EKG: Normal sinus rhythm, VR 93. PR 142, QRS 94, QTc 440. Non-specific TWA. No acute ST elevations.  ________________________________________  RADIOLOGY All imaging, including plain films, CT scans, and ultrasounds, independently reviewed by me, and interpretations confirmed via formal radiology reads.  ED MD interpretation:   CXR: COPD, no acute PNA CT Angio: No PE, emphysema, no PNA  Official radiology report(s): DG Chest 2 View  Result Date: 11/12/2019 CLINICAL DATA:  Left-sided chest pain, COPD EXAM: CHEST - 2 VIEW COMPARISON:  07/24/2019 FINDINGS: The heart size and mediastinal contours are stable. Mildly hyperexpanded lungs. Diffuse peribronchial thickening. Area of nodularity along the peripheral aspect of the left upper lobe appears slightly less prominent compared to prior CT, when accounting for differences in technique. Similar pleuroparenchymal scarring/thickening along the lateral aspect of the right upper lobe. No new focal airspace consolidation, pleural effusion, or pneumothorax. The visualized skeletal structures are unremarkable. IMPRESSION: Chronic findings of COPD without superimposed acute cardiopulmonary process. Electronically Signed   By: Davina Poke D.O.   On: 11/12/2019 15:28   CT Angio Chest PE W and/or Wo Contrast  Result Date: 11/12/2019 CLINICAL DATA:  Shortness of breath EXAM: CT ANGIOGRAPHY CHEST WITH CONTRAST TECHNIQUE: Multidetector CT imaging of the chest was performed using the standard protocol during bolus administration of intravenous contrast. Multiplanar CT image reconstructions and MIPs were obtained to evaluate the vascular anatomy. CONTRAST:  9mL OMNIPAQUE IOHEXOL 350 MG/ML SOLN COMPARISON:   Chest x-ray 11/12/2019, CT chest 07/24/2019 FINDINGS: Cardiovascular: Satisfactory opacification of the pulmonary arteries to the segmental level. No evidence of pulmonary embolism. Nonaneurysmal aorta. No dissection is seen. Mild aortic atherosclerosis. Normal heart size. Trace pericardial effusion Mediastinum/Nodes: Midline trachea. No thyroid mass. Slight decreased size of left prevascular lymph node measuring 10 mm, previously 11 mm. Right hilar node measures 9 mm, previously 12 mm. Esophagus within normal limits. Lungs/Pleura: Emphysema. Stable treated left upper lobe lung mass measuring 12 x 11 mm, previously 14 x 13 mm, series 6, image number 34. No acute consolidation, pleural effusion or pneumothorax. Upper Abdomen: Liver demonstrates low attenuation lesions similar compared to prior. Musculoskeletal: No acute or suspicious osseous abnormality Review of the MIP images confirms the above findings. IMPRESSION: 1. Negative for acute pulmonary embolus or aortic dissection. 2. Emphysema. Stable treated left upper lobe lung mass. No acute airspace disease. Aortic Atherosclerosis (ICD10-I70.0) and Emphysema (ICD10-J43.9). Electronically Signed   By: Donavan Foil M.D.   On: 11/12/2019 19:27    ____________________________________________  PROCEDURES   Procedure(s) performed (including Critical Care):  .1-3 Lead EKG Interpretation Performed by: Duffy Bruce, MD Authorized by: Duffy Bruce, MD     Interpretation: abnormal     ECG rate:  90-100   ECG rate assessment: tachycardic     Rhythm: sinus rhythm     Ectopy: PAC and PVCs     Conduction: abnormal   Comments:     Indication: COPD with respiratory distress, receiving nebulizers, chest pain.  Possible transient VTach episode noted - unclear if this was artifact versus true arrhythmia. Hospitalist notified. .Critical Care Performed by: Duffy Bruce, MD Authorized by: Duffy Bruce, MD   Critical care provider statement:     Critical care time (minutes):  35   Critical care time was exclusive of:  Separately billable procedures and treating other patients and teaching time   Critical care was necessary to treat or prevent imminent or life-threatening deterioration of the following  conditions:  Cardiac failure, circulatory failure and respiratory failure   Critical care was time spent personally by me on the following activities:  Development of treatment plan with patient or surrogate, discussions with consultants, evaluation of patient's response to treatment, examination of patient, obtaining history from patient or surrogate, ordering and performing treatments and interventions, ordering and review of laboratory studies, ordering and review of radiographic studies, pulse oximetry, re-evaluation of patient's condition and review of old charts   I assumed direction of critical care for this patient from another provider in my specialty: no      ____________________________________________  INITIAL IMPRESSION / MDM / [place] / ED COURSE  As part of my medical decision making, I reviewed the following data within the [place] notes reviewed and incorporated, Old chart reviewed, Notes from prior ED visits, and [place] Controlled Substance Database       *Steve Gregory was evaluated in Emergency Department on 11/12/2019 for the symptoms described in the history of present illness. He was evaluated in the context of the global COVID-19 pandemic, which necessitated consideration that the patient might be at risk for infection with the SARS-CoV-2 virus that causes COVID-19. Institutional protocols and algorithms that pertain to the evaluation of patients at risk for COVID-19 are in a state of rapid change based on information released by regulatory bodies including the CDC and federal and state organizations. These policies and algorithms were followed during the patient's care in the ED.   Some ED evaluations and interventions may be delayed as a result of limited staffing during the pandemic.*     Medical Decision Making:  64 yo M with PMHx COPD, lung CA, here with acute on chronic SOB with increased sputum production. Minimal improvement despite outpt increase in prednisone and AUgmentin. Suspect ongoing COPD exacerbation versus CAP. Will start IV steroids, Rocephin/Azithro given sputum production. Initial LA markedly elevated at 4.9. WBC normal, procal neg. I suspect this is 2/2 his increased WOB and theophylline use. He has no hypotension, normal procal, no evidence of hypoperfusion and I do not suspect septic shock. IVF given and repeat is 1.6. Given his failrue of outpt treatment, sputum production, pleuritic pain with h/o lung CA and PE, CT Angio obtained which shows no signs of PE. Will admit for IV steroids, ABX, and further management.  ____________________________________________  FINAL CLINICAL IMPRESSION(S) / ED DIAGNOSES  Final diagnoses:  COPD exacerbation ([place])     MEDICATIONS GIVEN DURING THIS VISIT:  Medications  enoxaparin (LOVENOX) injection 40 mg (has no administration in time range)  0.9 %  sodium chloride infusion (has no administration in time range)  acetaminophen (TYLENOL) tablet 650 mg (has no administration in time range)    Or  acetaminophen (TYLENOL) suppository 650 mg (has no administration in time range)  traZODone (DESYREL) tablet 25 mg (has no administration in time range)  magnesium hydroxide (MILK OF MAGNESIA) suspension 30 mL (has no administration in time range)  ondansetron (ZOFRAN) tablet 4 mg (has no administration in time range)    Or  ondansetron (ZOFRAN) injection 4 mg (has no administration in time range)  cefTRIAXone (ROCEPHIN) 1 g in sodium chloride 0.9 % 100 mL IVPB (0 g Intravenous Hold 11/12/19 2052)  ipratropium-albuterol (DUONEB) 0.5-2.5 (3) MG/3ML nebulizer solution 3 mL (has no administration in time range)    azithromycin (ZITHROMAX) 500 mg in sodium chloride 0.9 % 250 mL IVPB (has no administration in time range)  guaiFENesin (MUCINEX) 12 hr  tablet 600 mg (has no administration in time range)  chlorpheniramine-HYDROcodone (TUSSIONEX) 10-8 MG/5ML suspension 5 mL (has no administration in time range)  cefTRIAXone (ROCEPHIN) 2 g in sodium chloride 0.9 % 100 mL IVPB (0 g Intravenous Stopped 11/12/19 1847)  azithromycin (ZITHROMAX) 500 mg in sodium chloride 0.9 % 250 mL IVPB (0 mg Intravenous Stopped 11/12/19 2037)  sodium chloride 0.9 % bolus 1,000 mL (0 mLs Intravenous Stopped 11/12/19 1945)  morphine 4 MG/ML injection 4 mg (4 mg Intravenous Given 11/12/19 1959)  ondansetron (ZOFRAN) injection 4 mg (4 mg Intravenous Given 11/12/19 1958)  iohexol (OMNIPAQUE) 350 MG/ML injection 75 mL (75 mLs Intravenous Contrast Given 11/12/19 1855)  methylPREDNISolone sodium succinate (SOLU-MEDROL) 125 mg/2 mL injection 125 mg (125 mg Intravenous Given 11/12/19 2001)  albuterol (PROVENTIL) (2.5 MG/3ML) 0.083% nebulizer solution 5 mg (5 mg Nebulization Given 11/12/19 2007)  ketorolac (TORADOL) 30 MG/ML injection 15 mg (15 mg Intravenous Given 11/12/19 2027)     ED Discharge Orders    None       Note:  This document was prepared using Dragon voice recognition software and may include unintentional dictation errors.   Duffy Bruce, MD 11/12/19 2224    Duffy Bruce, MD 11/12/19 2230    Duffy Bruce, MD 11/13/19 [phone]

## 2019-11-12 NOTE — ED Notes (Signed)
Pt up to bedside toilet. Will give other meds once pt back to bed.

## 2019-11-12 NOTE — ED Triage Notes (Signed)
Pt in via POV, reports ongoing left side chest pain.  Reports being dx with "infection to my left lung."  Reports being on antibiotic therapy without any relief.  NAD noted at this time.

## 2019-11-12 NOTE — H&P (Addendum)
[place] [facility] [place] NAME: Steve Gregory    MR#:  [id number]  DATE OF BIRTH:  03-17-1956  DATE OF ADMISSION:  11/12/2019  PRIMARY CARE PHYSICIAN: Kirk Ruths, MD   REQUESTING/REFERRING PHYSICIAN: Duffy Bruce, MD  CHIEF COMPLAINT:   Chief Complaint  Patient presents with  . Chest Pain    HISTORY OF PRESENT ILLNESS:  Steve Gregory  is a 64 y.o. Caucasian male with a known history of multiple medical problems that are mentioned below, including advanced COPD and lung cancer, presented to the emergency room with a [place] of worsening dyspnea with associated cough that has been mainly dry and wheezing over the last month.  He admitted to left-sided chest wall and infraaxillary pain with cough and upon lying on his left side.  He was evaluated by his primary care physician who increased his prednisone from 10 mg to 20 mg p.o. daily and he was given Augmentin on 11/06/2019.  He continued to have worsening symptoms though and therefore came to the ER.  He denied any fever or chills.  No nausea or vomiting or abdominal pain.  No dysuria, oliguria or hematuria or flank pain.  Upon presentation to the emergency room, his blood pressure was 153/67 vital signs were otherwise normal With occasional tachypnea.  Labs revealed unremarkable CMP except for blood glucose of 199.  High-sensitivity troponin I was 3 and later less than 2.  Lactic acid was 4.9 initially and later 1.6 and procalcitonin was less than 0.1.  CBC showed leukocytosis of 13.5 with neutrophilia.  He had 2 blood cultures drawn chest x-ray showed chronic findings of COPD without acute cardiopulmonary disease.  He had a chest CTA that was negative for PE or aortic dissection.  It showed emphysema and stable treated left upper lobe lung mass with no acute airspace disease.  The patient was given nebulized Abrol, IV Rocephin and Zithromax, IV Solu-Medrol, 4 mg of IV morphine sulfate and 4 mg of IV Zofran and  1 L bolus of IV normal saline.  In the ER he had a brief run of suspected nonsustained V. tach.  His magnesium level came back 2 and his potassium was 3.7.  He will be admitted to a medical monitored bed for further evaluation and management. PAST MEDICAL HISTORY:   Past Medical History:  Diagnosis Date  . Allergy   . Anxiety   . Arthritis   . Barrett's esophagus   . Bronchitis, chronic ([place])   . Cancer (HCC)    LUNG  . COPD (chronic obstructive pulmonary disease) ([place])   . Cough    CHRONIC  . Depression   . Dizziness   . Dysphagia   . Essential hypertension 06/28/2017  . GERD (gastroesophageal reflux disease)   . H/O emphysema   . Headache   . History of depression 06/05/2014   Last Assessment & Plan:  Mood is doing well on medications.   . History of hiatal hernia   . HOH (hard of hearing)   . Hypertension   . Lung mass   . Maxillary fracture (HCC)    LEFT SIDE  . Orthopnea   . Oxygen decrease    H/O HOME USE, NOT NOW  . Personal history of colonic polyps   . Pneumonia    PSEUDOMONAL PNEUMONIA IN THE PAST  . PVD (peripheral vascular disease) ([place]) 06/05/2014   Overview:  Smoker with poor pulses  Last Assessment & Plan:  No leg pain is  noted of late.   . Renal failure    PRERENAL  . Seizures (HCC)    sounds like it could be vagal at times, coughing can cause him to fade out and see white lights. had last sz 3 months ago when not taking meds  . Shortness of breath dyspnea    uses many different inhalers  . Varicose veins of both lower extremities with pain 06/28/2017  . Wheezing     PAST SURGICAL HISTORY:   Past Surgical History:  Procedure Laterality Date  . CATARACT EXTRACTION W/PHACO Right 10/27/2015   Procedure: CATARACT EXTRACTION PHACO AND INTRAOCULAR LENS PLACEMENT ([place]) suture placed in right eye at end of procedure;  Surgeon: Estill Cotta, MD;  Location: [facility] ORS;  Service: Ophthalmology;  Laterality: Right;  [place] 01:02 AP% 24.4 CDE 28.12 fluid pack  lot # [id number]  . CATARACT EXTRACTION W/PHACO Left 11/17/2015   Procedure: CATARACT EXTRACTION PHACO AND INTRAOCULAR LENS PLACEMENT ([place]);  Surgeon: Estill Cotta, MD;  Location: [facility] ORS;  Service: Ophthalmology;  Laterality: Left;  [place] 01:29 AP% 24.3 CDE 40.05 fluid pack lot # [id number]  . COLONOSCOPY WITH PROPOFOL N/A 07/28/2017   Procedure: COLONOSCOPY WITH PROPOFOL;  Surgeon: [name], MD;  Location: [facility] ENDOSCOPY;  Service: Endoscopy;  Laterality: N/A;  . ESOPHAGOGASTRODUODENOSCOPY (EGD) WITH PROPOFOL N/A 06/06/2017   Procedure: ESOPHAGOGASTRODUODENOSCOPY (EGD) WITH PROPOFOL;  Surgeon: [name], MD;  Location[facility] ENDOSCOPY;  Service: Endoscopy;  Laterality: N/A;  . EYE SURGERY    . FACIAL RECONSTRUCTION SURGERY  1980   LEFT CHEEK BONE INJURY 1980    SOCIAL HISTORY:   Social History   Tobacco Use  . Smoking status: Current Every Day Smoker    Packs/day: 1.00    Years: 43.00    Pack years: 43.00    Types: Cigarettes  . Smokeless tobacco: Never Used  Substance Use Topics  . Alcohol use: No    FAMILY HISTORY:   Family History  Problem Relation Age of Onset  . Hyperlipidemia Mother   . Stroke Father   . Aneurysm Father   . Heart disease Brother   . Hypertension Brother     DRUG ALLERGIES:   Allergies  Allergen Reactions  . Chantix [Varenicline] Swelling       . Lipitor [Atorvastatin] Swelling  . Mobic [Meloxicam] Other (See Comments)    Myalgia  . Statins Other (See Comments)    Myalgia    REVIEW OF SYSTEMS:   ROS As per history of present illness. All pertinent systems were reviewed above. Constitutional,  HEENT, cardiovascular, respiratory, GI, GU, musculoskeletal, neuro, psychiatric, endocrine,  integumentary and hematologic systems were reviewed and are otherwise  negative/unremarkable except for positive findings mentioned above in the HPI.   MEDICATIONS AT HOME:   Prior to Admission medications   Medication Sig Start Date  End Date Taking? Authorizing Provider  albuterol (PROVENTIL) (2.5 MG/3ML) 0.083% nebulizer solution Take 2.5 mg by nebulization every 6 (six) hours as needed for wheezing or shortness of breath.    [provider]  aspirin EC 81 MG tablet Take by mouth.    [provider]  famotidine (PEPCID) 20 MG tablet Take 20 mg by mouth 2 (two) times daily. 04/09/19   [provider]  fluticasone (FLONASE) 50 MCG/ACT nasal spray Place 2 sprays into both nostrils daily.    [provider]  Fluticasone-Salmeterol (ADVAIR) 250-50 MCG/DOSE AEPB Inhale 1 puff into the lungs 2 (two) times daily.    [provider]  ipratropium-albuterol (DUONEB) 0.5-2.5 (3) MG/3ML SOLN Take 3 mLs by nebulization.    [provider]  losartan (COZAAR) 25 MG tablet Take 25 mg by mouth daily.    [provider]  montelukast (SINGULAIR) 10 MG tablet Take 10 mg by mouth every morning.    [provider]  pantoprazole (PROTONIX) 40 MG tablet Take 40 mg by mouth 2 (two) times daily. 06/07/19   [provider]  predniSONE (DELTASONE) 10 MG tablet Take 15 mg by mouth daily with breakfast.    [provider]  theophylline (UNIPHYL) 400 MG 24 hr tablet Take 400 mg by mouth daily.    [provider]  VENTOLIN HFA 108 [phone] Base) MCG/ACT inhaler  04/27/19   [provider]      VITAL SIGNS:  Blood pressure 140/72, pulse 86, temperature 98 F (36.7 C), temperature source Oral, resp. rate (!) 25, height 5\' 10"  (1.778 m), weight 75.3 kg, SpO2 100 %.  PHYSICAL EXAMINATION:  Physical Exam  GENERAL:  64 y.o.-year-old patient lying in the bed with mild respiratory distress with conversational dyspnea. EYES: Pupils equal, round, reactive to light and accommodation. No scleral icterus. Extraocular muscles intact.  HEENT: Head atraumatic, normocephalic. Oropharynx and nasopharynx clear.  NECK:  Supple, no jugular venous distention. No thyroid  enlargement, no tenderness.  LUNGS: Diffuse expiratory wheezes with tight expiratory airflow and harsh vesicular breathing. CARDIOVASCULAR: Regular rate and rhythm, S1, S2 normal. No murmurs, rubs, or gallops.  ABDOMEN: Soft, nondistended, nontender. Bowel sounds present. No organomegaly or mass.  EXTREMITIES: No pedal edema, cyanosis, or clubbing.  NEUROLOGIC: Cranial nerves II through XII are intact. Muscle strength 5/5 in all extremities. Sensation intact. Gait not checked.  PSYCHIATRIC: The patient is alert and oriented x 3.  Normal affect and good eye contact. SKIN: No obvious rash, lesion, or ulcer.   LABORATORY PANEL:   CBC Recent Labs  Lab 11/12/19 1454  WBC 13.5*  HGB 14.1  HCT 43.5  PLT 281   ------------------------------------------------------------------------------------------------------------------  Chemistries  Recent Labs  Lab 11/12/19 1454  NA 138  K 3.7  CL 104  CO2 26  GLUCOSE 199*  BUN 11  CREATININE 1.13  CALCIUM 9.1  AST 23  ALT 12  ALKPHOS 77  BILITOT 0.7   ------------------------------------------------------------------------------------------------------------------  Cardiac Enzymes No results for input(s): TROPONINI in the last 168 hours. ------------------------------------------------------------------------------------------------------------------  RADIOLOGY:  DG Chest 2 View  Result Date: 11/12/2019 CLINICAL DATA:  Left-sided chest pain, COPD EXAM: CHEST - 2 VIEW COMPARISON:  07/24/2019 FINDINGS: The heart size and mediastinal contours are stable. Mildly hyperexpanded lungs. Diffuse peribronchial thickening. Area of nodularity along the peripheral aspect of the left upper lobe appears slightly less prominent compared to prior CT, when accounting for differences in technique. Similar pleuroparenchymal scarring/thickening along the lateral aspect of the right upper lobe. No new focal airspace consolidation, pleural effusion, or  pneumothorax. The visualized skeletal structures are unremarkable. IMPRESSION: Chronic findings of COPD without superimposed acute cardiopulmonary process. Electronically Signed   By: Davina Poke D.O.   On: 11/12/2019 15:28   CT Angio Chest PE W and/or Wo Contrast  Result Date: 11/12/2019 CLINICAL DATA:  Shortness of breath EXAM: CT ANGIOGRAPHY CHEST WITH CONTRAST TECHNIQUE: Multidetector CT imaging of the chest was performed using the standard protocol during bolus administration of intravenous contrast. Multiplanar CT image reconstructions and MIPs were obtained to evaluate the vascular anatomy. CONTRAST:  26mL OMNIPAQUE IOHEXOL 350 MG/ML SOLN COMPARISON:  Chest x-ray 11/12/2019, CT  chest 07/24/2019 FINDINGS: Cardiovascular: Satisfactory opacification of the pulmonary arteries to the segmental level. No evidence of pulmonary embolism. Nonaneurysmal aorta. No dissection is seen. Mild aortic atherosclerosis. Normal heart size. Trace pericardial effusion Mediastinum/Nodes: Midline trachea. No thyroid mass. Slight decreased size of left prevascular lymph node measuring 10 mm, previously 11 mm. Right hilar node measures 9 mm, previously 12 mm. Esophagus within normal limits. Lungs/Pleura: Emphysema. Stable treated left upper lobe lung mass measuring 12 x 11 mm, previously 14 x 13 mm, series 6, image number 34. No acute consolidation, pleural effusion or pneumothorax. Upper Abdomen: Liver demonstrates low attenuation lesions similar compared to prior. Musculoskeletal: No acute or suspicious osseous abnormality Review of the MIP images confirms the above findings. IMPRESSION: 1. Negative for acute pulmonary embolus or aortic dissection. 2. Emphysema. Stable treated left upper lobe lung mass. No acute airspace disease. Aortic Atherosclerosis (ICD10-I70.0) and Emphysema (ICD10-J43.9). Electronically Signed   By: Donavan Foil M.D.   On: 11/12/2019 19:27      IMPRESSION AND PLAN:   1.  COPD acute  exacerbation likely secondary to acute bronchitis. -The patient will be admitted to a medically monitored bed. -He will be placed on IV steroid therapy with IV Solu-Medrol as well as nebulized bronchodilator therapy with duonebs q.i.d. and q.4 hours p.r.n., mucolytic therapy with Mucinex. -Given severe exacerbation he will be placed on IV antibiotic therapy with IV Rocephin and Zithromax. - Sputum Gram stain culture and sensitivity will be obtained and will follow blood cultures.   -O2 protocol will be followed. -We will continue with theophylline. -We will hold off his Advair Diskus.  2.  Hypertension. -We will continue his antihypertensives and place him on as needed IV labetalol.  3.  Tobacco abuse. -He was counseled for smoking cessation and will receive further counseling here.  4.  DVT prophylaxis. -Subcutaneous Lovenox.   All the records are reviewed and case discussed with ED provider. The plan of care was discussed in details with the patient (and family). I answered all questions. The patient agreed to proceed with the above mentioned plan. Further management will depend upon hospital course.   CODE STATUS: Full code  TOTAL TIME TAKING CARE OF THIS PATIENT:50 minutes.    Christel Mormon M.D on 11/12/2019 at 8:34 PM  [facility]   From 7 PM-7 AM, contact night-coverage [email or web address]  CC: Primary care physician; Kirk Ruths, MD   Note: This dictation was prepared with Dragon dictation along with smaller phrase technology. Any transcriptional errors that result from this process are unintentional.

## 2019-11-12 NOTE — ED Notes (Signed)
Pt given foot tray and blanket. Bed locked low. Rail up. Call bell within reach. Pt denies any needs currently.

## 2019-11-12 NOTE — ED Notes (Signed)
2nd set of cultures taken via butterfly from R fa.

## 2019-11-12 NOTE — ED Notes (Signed)
EKG taken during triage, therefore, will not complete 2nd EKG for baseline.

## 2019-11-12 NOTE — ED Notes (Addendum)
Pt reports COPD history but denies home oxygen. Pt currently wheezing on auscultation. Reports inc pain to L side of chest upon taking a deep breath. Reports gets radiation treatments and is on prednisone as well as an antibiotic for a lung infection. Pt's chest red; states this is baseline.

## 2019-11-13 DIAGNOSIS — J441 Chronic obstructive pulmonary disease with (acute) exacerbation: Secondary | ICD-10-CM | POA: Diagnosis not present

## 2019-11-13 LAB — CBC
HCT: 39.6 % (ref 39.0–52.0)
Hemoglobin: 12.6 g/dL — ABNORMAL LOW (ref 13.0–17.0)
MCH: 30.1 pg (ref 26.0–34.0)
MCHC: 31.8 g/dL (ref 30.0–36.0)
MCV: 94.7 fL (ref 80.0–100.0)
Platelets: 249 10*3/uL (ref 150–400)
RBC: 4.18 MIL/uL — ABNORMAL LOW (ref 4.22–5.81)
RDW: 14.7 % (ref 11.5–15.5)
WBC: 13.1 10*3/uL — ABNORMAL HIGH (ref 4.0–10.5)
nRBC: 0 % (ref 0.0–0.2)

## 2019-11-13 LAB — BASIC METABOLIC PANEL
Anion gap: 7 (ref 5–15)
BUN: 15 mg/dL (ref 8–23)
CO2: 25 mmol/L (ref 22–32)
Calcium: 8.8 mg/dL — ABNORMAL LOW (ref 8.9–10.3)
Chloride: 108 mmol/L (ref 98–111)
Creatinine, Ser: 1.06 mg/dL (ref 0.61–1.24)
GFR calc Af Amer: >60 mL/min (ref >60)
GFR calc non Af Amer: >60 mL/min (ref >60)
Glucose, Bld: 187 mg/dL — ABNORMAL HIGH (ref 70–99)
Potassium: 4.6 mmol/L (ref 3.5–5.1)
Sodium: 140 mmol/L (ref 135–145)

## 2019-11-13 LAB — SARS CORONAVIRUS 2 BY RT PCR (DIASORIN): SARS Coronavirus 2: NEGATIVE

## 2019-11-13 LAB — PROCALCITONIN: Procalcitonin: <0.1 ng/mL

## 2019-11-13 MED ORDER — ALBUTEROL SULFATE HFA 108 (90 BASE) MCG/ACT IN AERS: 1.0000 | INHALATION_SPRAY | RESPIRATORY_TRACT | Status: DC | PRN

## 2019-11-13 MED ORDER — PREDNISONE 10 MG PO TABS: ORAL_TABLET | ORAL | 0 refills | Status: DC

## 2019-11-13 MED ORDER — PREDNISONE 50 MG PO TABS: 50.0000 mg | ORAL_TABLET | Freq: Every day | ORAL | Status: DC

## 2019-11-13 MED ORDER — AZITHROMYCIN 250 MG PO TABS
250.0000 mg | ORAL_TABLET | Freq: Every day | ORAL | Status: DC
  Filled 2019-11-13: qty 1

## 2019-11-13 MED ORDER — AZITHROMYCIN 250 MG PO TABS: ORAL_TABLET | ORAL | 0 refills | Status: DC

## 2019-11-13 NOTE — Care Management Obs Status (Signed)
[facility] NOTIFICATION   Patient Details  Name: Steve Gregory MRN: [id number] Date of Birth: [birth year]   Medicare Observation Status Notification Given:  Yes    Shelbie Hutching, RN 11/13/2019, 9:10 AM

## 2019-11-13 NOTE — Progress Notes (Signed)
PT screen Note  Patient Details Name: Steve Gregory MRN: [id number] DOB: 1955-10-20   Cancelled Treatment:    Reason Eval/Treat Not Completed: PT screened, no needs identified, will sign off Pt able to easily get to EOB, rise to standing and manage his equipment w/o assist.  After standing at the commode to use the bathroom and then wash his hands he was able to balance on 1 leg to use kick pedal on trash can to throw away paper towel.  Pt's vitals were stable the entire time and he reports feeling he is at his baseline.  No formal PT exam, screen only.  Pt safe to return home w/o PT follow up, will sign off.  Kreg Shropshire, DPT 11/13/2019, 9:46 AM

## 2019-11-13 NOTE — Care Management CC44 (Signed)
Condition Code 44 Documentation Completed  Patient Details  Name: Steve Gregory MRN: [id number] Date of Birth: July 09, 1956   Condition Code 44 given:  Yes Patient signature on Condition Code 44 notice:    Documentation of 2 MD's agreement:  Yes Code 44 added to claim:  Yes    Shelbie Hutching, RN 11/13/2019, 9:10 AM

## 2019-11-13 NOTE — Progress Notes (Signed)
OT Cancellation Note  Patient Details Name: Steve Gregory MRN: [id number] DOB: 1956/04/17   Cancelled Treatment:    Reason Eval/Treat Not Completed: OT screened, no needs identified, will sign off Order received for OT evaluation and chart reviewed.  Met with patient and no OT needs identified and seen for screening only.  Thank you for the referral.  Chrys Racer, OTR/L, [facility] ascom [phone] 11/13/19, 9:19 AM

## 2019-11-13 NOTE — Discharge Summary (Addendum)
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 Allergy   . Anxiety   . Arthritis   . Barrett's esophagus   . Bronchitis, chronic ([place])   . Cancer (HCC)    LUNG  . COPD (chronic obstructive pulmonary disease) ([place])   . Cough    CHRONIC  . Depression   . Dizziness   . Dysphagia   . Essential hypertension 06/28/2017  . GERD (gastroesophageal reflux disease)   . H/O emphysema   . Headache   . History of depression 06/05/2014   Last Assessment & Plan:  Mood is doing well on medications.   . History of hiatal hernia   . HOH (hard of hearing)   . Hypertension   . Lung mass   . Maxillary fracture (HCC)    LEFT SIDE  . Orthopnea   . Oxygen decrease    H/O HOME USE, NOT NOW  . Personal history of colonic polyps   . Pneumonia    PSEUDOMONAL PNEUMONIA IN THE PAST  . PVD (peripheral vascular disease) ([place]) 06/05/2014   Overview:  Smoker with poor pulses  Last Assessment & Plan:  No leg pain is noted of late.   . Renal failure    PRERENAL  . Seizures (HCC)    sounds like it could be vagal at times, coughing can cause him to fade out and see white lights. had last sz 3 months ago when not taking meds  . Shortness of breath dyspnea    uses many different inhalers  . Varicose veins of both lower extremities with pain 06/28/2017  . Wheezing     HOSPITAL COURSE:   Steve Gregory  is a 64 y.o. Caucasian male with a known history of multiple medical problems that  are mentioned below, including advanced COPD and lung cancer, presented to the emergency room with worsening dyspnea with associated cough that has been mainly dry and wheezing over the last severl days.  1.  COPD acute on chronic exacerbation likely secondary to acute bronchitis. -pt recieved IV steroid therapy with IV Solu-Medrol as well as nebulized bronchodilator therapy with oral inhalers q.4 hours p.r.n., mucolytic therapy with Mucinex. -IV  Rocephin and Zithromax-- change to oral Zithromax. -Pro calcitonin less than .10 -CT chest shows right upper lobe mass. No new consolidation or air bronco gram -should afebrile. White count 13,000 -sats 94 to 96% on room air. Currently not wheezing. Patient requesting to go home. - continue with theophylline. -Resume home inhalers.  -COVID-19 negative  2.  Hypertension. -Continue losartan  3.  Tobacco abuse. -He was counseled for smoking cessation does not appear motivated.  4.  DVT prophylaxis. -Subcutaneous Lovenox  5. 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 Donella Stade -he has follow-up appointment with him in May -patient did not want to do lung biopsy and any other treatment but radiation which he completed.  6.Hyperglycemia likely due to steroids. Patient takes  chronic prednisone at home. Defer to  Dr. Ouida Sills for further monitoring of sugars as outpatient  Overall hemodynamically stable discharge home outpatient follow-up with Dr. Ouida Sills.  CONSULTS OBTAINED:    DRUG ALLERGIES:   Allergies  Allergen Reactions  . Chantix [Varenicline] Swelling       . Lipitor [Atorvastatin] Swelling  . Mobic [Meloxicam] Other (See Comments)    Myalgia  . Statins Other (See Comments)    Myalgia    DISCHARGE MEDICATIONS:   Allergies as of 11/13/2019      Reactions   Chantix [varenicline] Swelling       Lipitor [atorvastatin] Swelling   Mobic [meloxicam] Other (See  Comments)   Myalgia   Statins Other (See Comments)   Myalgia      Medication List    STOP taking these medications   amoxicillin-clavulanate 500-125 MG tablet Commonly known as: AUGMENTIN     TAKE these medications   azithromycin 250 MG tablet Commonly known as: ZITHROMAX Take daily as directed   famotidine 20 MG tablet Commonly known as: PEPCID Take 20 mg by mouth 2 (two) times daily.   fluticasone 50 MCG/ACT nasal spray Commonly known as: FLONASE Place 2 sprays into both nostrils daily.   fluticasone-salmeterol 115-21 MCG/ACT inhaler Commonly known as: ADVAIR HFA Inhale 2 puffs into the lungs 2 (two) times daily.   losartan 50 MG tablet Commonly known as: COZAAR Take 50 mg by mouth daily.   montelukast 10 MG tablet Commonly known as: SINGULAIR Take 10 mg by mouth daily.   predniSONE 10 MG tablet Commonly known as: DELTASONE Take 50 mg daily--taper by 10 mg daily thereafter resume your chronic daily prednisone dose What changed:   how much to take  how to take this  when to take this  additional instructions   theophylline 400 MG 24 hr tablet Commonly known as: UNIPHYL Take 400 mg by mouth daily.   Ventolin HFA 108 (90 Base) MCG/ACT inhaler Generic drug: albuterol Inhale 2 puffs into the lungs every 6 (six) hours as needed for wheezing or shortness of breath.   albuterol (2.5 MG/3ML) 0.083% nebulizer solution Commonly known as: PROVENTIL Take 2.5 mg by nebulization every 6 (six) hours as needed for wheezing or shortness of breath.       If you experience worsening of your admission symptoms, develop shortness of breath, life threatening emergency, suicidal or homicidal thoughts you must seek medical attention immediately by calling 911 or calling your MD immediately  if symptoms less severe.  You Must read complete instructions/literature along with all the possible adverse reactions/side effects for all the Medicines you take and that have been  prescribed to you. Take any new Medicines after you have completely understood and accept all the possible adverse reactions/side effects.   Please note  You were cared for by a hospitalist during your hospital stay. If you have any questions about your discharge medications or the care you received while you were in the hospital after you are discharged, you can call the unit and asked to speak with the hospitalist on call if the hospitalist that took care of you is not available. Once you are discharged, your primary care physician will handle any further medical issues. Please note that NO REFILLS for any discharge medications will be authorized once you are discharged, as it is imperative that you return to your primary care physician (or establish a relationship with a primary care physician if you do not have one) for your aftercare  needs so that they can reassess your need for medications and monitor your lab values. Today   SUBJECTIVE   Feels better than yesterday. No wheezing. Requesting to go home  VITAL SIGNS:  Blood pressure (!) 142/58, pulse 83, temperature 98.4 F (36.9 C), temperature source Oral, resp. rate 20, height 5\' 10"  (1.778 m), weight 75.3 kg, SpO2 95 %.  I/O:    Intake/Output Summary (Last 24 hours) at 11/13/2019 0840 Last data filed at 11/13/2019 0558 Gross per 24 hour  Intake 363.83 ml  Output 250 ml  Net 113.83 ml    PHYSICAL EXAMINATION:  GENERAL:  64 y.o.-year-old patient lying in the bed with no acute distress.  Appears disheveled EYES: Pupils equal, round, reactive to light and accommodation. No scleral icterus.  HEENT: Head atraumatic, normocephalic. Oropharynx and nasopharynx clear.  NECK:  Supple, no jugular venous distention. No thyroid enlargement, no tenderness.  LUNGS: Normal breath sounds bilaterally, no wheezing, rales,rhonchi or crepitation. No use of accessory muscles of respiration.  CARDIOVASCULAR: S1, S2 normal. No murmurs, rubs, or  gallops.  ABDOMEN: Soft, non-tender, non-distended. Bowel sounds present. No organomegaly or mass.  EXTREMITIES: No pedal edema, cyanosis, or clubbing.  NEUROLOGIC: Cranial nerves II through XII are intact. Muscle strength 5/5 in all extremities. Sensation intact. Gait not checked.  PSYCHIATRIC: The patient is alert and oriented x 3.  SKIN: No obvious rash, lesion, or ulcer.   DATA REVIEW:   CBC  Recent Labs  Lab 11/13/19 0520  WBC 13.1*  HGB 12.6*  HCT 39.6  PLT 249    Chemistries  Recent Labs  Lab 11/12/19 1454 11/12/19 1454 11/12/19 1735 11/13/19 0520  NA 138   < >  --  140  K 3.7   < >  --  4.6  CL 104   < >  --  108  CO2 26   < >  --  25  GLUCOSE 199*   < >  --  187*  BUN 11   < >  --  15  CREATININE 1.13   < >  --  1.06  CALCIUM 9.1   < >  --  8.8*  MG  --   --  2.0  --   AST 23  --   --   --   ALT 12  --   --   --   ALKPHOS 77  --   --   --   BILITOT 0.7  --   --   --    < > = values in this interval not displayed.    Microbiology Results   Recent Results (from the past 240 hour(s))  Blood culture (routine x 2)     Status: None (Preliminary result)   Collection Time: 11/12/19  6:04 PM   Specimen: BLOOD  Result Value Ref Range Status   Specimen Description BLOOD BLOOD LEFT ARM  Final   Special Requests   Final    BOTTLES DRAWN AEROBIC AND ANAEROBIC Blood Culture adequate volume   Culture   Final    NO GROWTH < 24 HOURS Performed at [facility], [address] [place], [place] [address]    Report Status PENDING  Incomplete  Blood culture (routine x 2)     Status: None (Preliminary result)   Collection Time: 11/12/19  6:04 PM   Specimen: BLOOD  Result Value Ref Range Status   Specimen Description BLOOD BLOOD RIGHT FOREARM  Final   Special Requests  Final    BOTTLES DRAWN AEROBIC AND ANAEROBIC Blood Culture adequate volume   Culture   Final    NO GROWTH < 24 HOURS Performed at [facility], [address] [place],  [place] [address]    Report Status PENDING  Incomplete  SARS Coronavirus 2 by RT PCR     Status: None   Collection Time: 11/12/19 10:08 PM  Result Value Ref Range Status   SARS Coronavirus 2 NEGATIVE NEGATIVE Final    Comment: (NOTE) Result indicates the ABSENCE of SARS-CoV-2 RNA in the patient specimen.  The lowest concentration of SARS-CoV-2 viral copies this assay can detect in nasopharyngeal swab specimens is 500 copies / mL.  A negative result does not preclude SARS-CoV-2 infection and should not be used as the sole basis for patient management decisions. A negative result may occur with improper specimen collection / handling, submission of a specimen other than nasopharyngeal swab, presence of viral mutation(s) within the areas targeted by this assay, and inadequate number of viral copies (<500 copies / mL) present.  Negative results must be combined with clinical observations, patient history, and epidemiological information.  The expected result is NEGATIVE.  Patient Fact Sheet:  BlogSelections.co.uk   Provider Fact Sheet:  https://lucas.com/   This test is not yet approved or cleared by the [place] FDA and  has been authorized for  detection and/or diagnosis of SARS-CoV-2 by FDA under an Emergency Use Authorization (EUA).  This EUA will remain in effect (meaning this test can be used) for the duration of  the COVID-19 declaration under Section 564(b)(1) of the Act, 21 U.S.C. section 360bbb-3(b)(1), unless the authorization is terminated or revoked sooner Performed at [facility], [place] [address], [place], [place] [address]     RADIOLOGY:  DG Chest 2 View  Result Date: 11/12/2019 CLINICAL DATA:  Left-sided chest pain, COPD EXAM: CHEST - 2 VIEW COMPARISON:  07/24/2019 FINDINGS: The heart size and mediastinal contours are stable. Mildly hyperexpanded lungs. Diffuse peribronchial thickening. Area of nodularity along the  peripheral aspect of the left upper lobe appears slightly less prominent compared to prior CT, when accounting for differences in technique. Similar pleuroparenchymal scarring/thickening along the lateral aspect of the right upper lobe. No new focal airspace consolidation, pleural effusion, or pneumothorax. The visualized skeletal structures are unremarkable. IMPRESSION: Chronic findings of COPD without superimposed acute cardiopulmonary process. Electronically Signed   By: Davina Poke D.O.   On: 11/12/2019 15:28   CT Angio Chest PE W and/or Wo Contrast  Result Date: 11/12/2019 CLINICAL DATA:  Shortness of breath EXAM: CT ANGIOGRAPHY CHEST WITH CONTRAST TECHNIQUE: Multidetector CT imaging of the chest was performed using the standard protocol during bolus administration of intravenous contrast. Multiplanar CT image reconstructions and MIPs were obtained to evaluate the vascular anatomy. CONTRAST:  67mL OMNIPAQUE IOHEXOL 350 MG/ML SOLN COMPARISON:  Chest x-ray 11/12/2019, CT chest 07/24/2019 FINDINGS: Cardiovascular: Satisfactory opacification of the pulmonary arteries to the segmental level. No evidence of pulmonary embolism. Nonaneurysmal aorta. No dissection is seen. Mild aortic atherosclerosis. Normal heart size. Trace pericardial effusion Mediastinum/Nodes: Midline trachea. No thyroid mass. Slight decreased size of left prevascular lymph node measuring 10 mm, previously 11 mm. Right hilar node measures 9 mm, previously 12 mm. Esophagus within normal limits. Lungs/Pleura: Emphysema. Stable treated left upper lobe lung mass measuring 12 x 11 mm, previously 14 x 13 mm, series 6, image number 34. No acute consolidation, pleural effusion or pneumothorax. Upper Abdomen: Liver demonstrates low attenuation  lesions similar compared to prior. Musculoskeletal: No acute or suspicious osseous abnormality Review of the MIP images confirms the above findings. IMPRESSION: 1. Negative for acute pulmonary embolus or  aortic dissection. 2. Emphysema. Stable treated left upper lobe lung mass. No acute airspace disease. Aortic Atherosclerosis (ICD10-I70.0) and Emphysema (ICD10-J43.9). Electronically Signed   By: Donavan Foil M.D.   On: 11/12/2019 19:27     CODE STATUS:     Code Status Orders  (From admission, onward)         Start     Ordered   11/12/19 2024  Full code  Continuous     11/12/19 2031        Code Status History    This patient has a current code status but no historical code status.   Advance Care Planning Activity       TOTAL TIME TAKING CARE OF THIS PATIENT: **35* minutes.    Fritzi Mandes M.D  [facility]    CC: Primary care physician; Kirk Ruths, MD

## 2019-11-13 NOTE — Discharge Instructions (Signed)
Advised smoking cessation

## 2019-11-13 NOTE — Progress Notes (Signed)
Discharge instructions reviewed with the patient.  Patient verbalized understanding. IVs removed and dressing clean dry and intact. Pt. Belongings with patient. Patient escorted via wheelchair to his vehicle located in the ED parking lot.

## 2019-11-14 ENCOUNTER — Ambulatory Visit: Admission: RE | Admit: 2019-11-14 | Payer: Medicare Other | Source: Home / Self Care | Admitting: Internal Medicine

## 2019-11-14 ENCOUNTER — Encounter: Admission: RE | Payer: Self-pay | Source: Home / Self Care

## 2019-11-14 HISTORY — DX: Unspecified kidney failure: N19

## 2019-11-14 HISTORY — DX: Personal history of colon polyps, unspecified: Z86.0100

## 2019-11-14 HISTORY — DX: Dysphagia, unspecified: R13.10

## 2019-11-14 HISTORY — DX: Maxillary fracture, unspecified side, initial encounter for closed fracture: S02.401A

## 2019-11-14 HISTORY — DX: Barrett's esophagus without dysplasia: K22.70

## 2019-11-14 HISTORY — DX: Personal history of colonic polyps: Z86.010

## 2019-11-14 SURGERY — EGD (ESOPHAGOGASTRODUODENOSCOPY)
Anesthesia: General

## 2019-11-17 LAB — CULTURE, BLOOD (ROUTINE X 2)
Culture: NO GROWTH
Culture: NO GROWTH
Special Requests: ADEQUATE
Special Requests: ADEQUATE

## 2020-01-03 ENCOUNTER — Ambulatory Visit: Payer: Medicare Other | Admitting: Dermatology

## 2020-01-07 ENCOUNTER — Other Ambulatory Visit: Payer: Self-pay

## 2020-01-07 ENCOUNTER — Ambulatory Visit (INDEPENDENT_AMBULATORY_CARE_PROVIDER_SITE_OTHER): Payer: Medicare Other | Admitting: Dermatology

## 2020-01-07 DIAGNOSIS — L578 Other skin changes due to chronic exposure to nonionizing radiation: Secondary | ICD-10-CM

## 2020-01-07 DIAGNOSIS — C4492 Squamous cell carcinoma of skin, unspecified: Secondary | ICD-10-CM

## 2020-01-07 DIAGNOSIS — C44529 Squamous cell carcinoma of skin of other part of trunk: Secondary | ICD-10-CM

## 2020-01-07 DIAGNOSIS — D485 Neoplasm of uncertain behavior of skin: Secondary | ICD-10-CM | POA: Diagnosis not present

## 2020-01-07 DIAGNOSIS — L281 Prurigo nodularis: Secondary | ICD-10-CM

## 2020-01-07 DIAGNOSIS — C44622 Squamous cell carcinoma of skin of right upper limb, including shoulder: Secondary | ICD-10-CM

## 2020-01-07 DIAGNOSIS — D492 Neoplasm of unspecified behavior of bone, soft tissue, and skin: Secondary | ICD-10-CM

## 2020-01-07 HISTORY — DX: Squamous cell carcinoma of skin, unspecified: C44.92

## 2020-01-07 NOTE — Progress Notes (Signed)
Follow-Up Visit   Subjective  Steve Gregory is a 64 y.o. male who presents for the following: Skin Problem (check growths growing on my back and R arm, pt report these area appeared  when he started Radiation for Lung cancer ).   The following portions of the chart were reviewed this encounter and updated as appropriate:  Tobacco  Allergies  Meds  Problems  Med Hx  Surg Hx  Fam Hx      Review of Systems:  No other skin or systemic complaints except as noted in HPI or Assessment and Plan.  Objective  Well appearing patient in no apparent distress; mood and affect are within normal limits.  A focused examination was performed including back, R arm . Relevant physical exam findings are noted in the Assessment and Plan.  Objective  Right mid volar forearm: 1.8 cm crusted papule   Objective  Righ mid to low back lateral: 1.0 cm pink crusted papule   Objective  Mid back spinal: 2.2 cm pink crusted papule    Assessment & Plan    Lung Cancer treated with Radiation   Neoplasm of skin (3) Right mid volar forearm  Epidermal / dermal shaving  Lesion length (cm):  1.8 Lesion width (cm):  1.8 Margin per side (cm):  0.2 Total excision diameter (cm):  2.2 Informed consent: discussed and consent obtained   Timeout: patient name, date of birth, surgical site, and procedure verified   Procedure prep:  Patient was prepped and draped in usual sterile fashion Prep type:  Isopropyl alcohol Anesthesia: the lesion was anesthetized in a standard fashion   Anesthetic:  1% lidocaine w/ epinephrine 1-100,000 buffered w/ 8.4% NaHCO3 Hemostasis achieved with: pressure, aluminum chloride and electrodesiccation   Outcome: patient tolerated procedure well   Post-procedure details: sterile dressing applied and wound care instructions given   Dressing type: bandage and petrolatum    Destruction of lesion Complexity: extensive   Destruction method: electrodesiccation and curettage     Informed consent: discussed and consent obtained   Timeout:  patient name, date of birth, surgical site, and procedure verified Procedure prep:  Patient was prepped and draped in usual sterile fashion Prep type:  Isopropyl alcohol Anesthesia: the lesion was anesthetized in a standard fashion   Anesthetic:  1% lidocaine w/ epinephrine 1-100,000 buffered w/ 8.4% NaHCO3 Curettage performed in three different directions: Yes   Electrodesiccation performed over the curetted area: Yes   Lesion length (cm):  1.8 Lesion width (cm):  1.8 Margin per side (cm):  0.2 Final wound size (cm):  2.2 Hemostasis achieved with:  pressure, aluminum chloride and electrodesiccation Outcome: patient tolerated procedure well with no complications   Post-procedure details: sterile dressing applied and wound care instructions given   Dressing type: bandage and petrolatum    Specimen 1 - Surgical pathology Differential Diagnosis: R/O Metastatic Lung cancer vs SCC Check Margins: No 1.8 cm crusted papule  Righ mid to low back lateral  Epidermal / dermal shaving  Lesion length (cm):  1 Lesion width (cm):  1 Margin per side (cm):  0.2 Total excision diameter (cm):  1.4 Informed consent: discussed and consent obtained   Timeout: patient name, date of birth, surgical site, and procedure verified   Procedure prep:  Patient was prepped and draped in usual sterile fashion Prep type:  Isopropyl alcohol Anesthesia: the lesion was anesthetized in a standard fashion   Anesthetic:  1% lidocaine w/ epinephrine 1-100,000 buffered w/ 8.4% NaHCO3 Hemostasis achieved with:  pressure, aluminum chloride and electrodesiccation   Outcome: patient tolerated procedure well   Post-procedure details: sterile dressing applied and wound care instructions given   Dressing type: bandage and petrolatum    Destruction of lesion Complexity: extensive   Destruction method: electrodesiccation and curettage   Informed consent:  discussed and consent obtained   Timeout:  patient name, date of birth, surgical site, and procedure verified Procedure prep:  Patient was prepped and draped in usual sterile fashion Prep type:  Isopropyl alcohol Anesthesia: the lesion was anesthetized in a standard fashion   Anesthetic:  1% lidocaine w/ epinephrine 1-100,000 buffered w/ 8.4% NaHCO3 Curettage performed in three different directions: Yes   Electrodesiccation performed over the curetted area: Yes   Lesion length (cm):  1 Lesion width (cm):  1 Margin per side (cm):  0.2 Final wound size (cm):  1.4 Hemostasis achieved with:  pressure, aluminum chloride and electrodesiccation Outcome: patient tolerated procedure well with no complications   Post-procedure details: sterile dressing applied and wound care instructions given   Dressing type: bandage and petrolatum    Specimen 2 - Surgical pathology Differential Diagnosis: R/O Metastatic Lung cancer vs SCC Check Margins: No 1.0 cm pink crusted papule  Mid back spinal  Epidermal / dermal shaving  Lesion length (cm):  2.2 Lesion width (cm):  2.2 Margin per side (cm):  0.2 Total excision diameter (cm):  2.6 Informed consent: discussed and consent obtained   Timeout: patient name, date of birth, surgical site, and procedure verified   Procedure prep:  Patient was prepped and draped in usual sterile fashion Prep type:  Isopropyl alcohol Anesthesia: the lesion was anesthetized in a standard fashion   Anesthetic:  1% lidocaine w/ epinephrine 1-100,000 buffered w/ 8.4% NaHCO3 Hemostasis achieved with: pressure, aluminum chloride and electrodesiccation   Outcome: patient tolerated procedure well   Post-procedure details: sterile dressing applied and wound care instructions given   Dressing type: bandage and petrolatum    Destruction of lesion Complexity: extensive   Destruction method: electrodesiccation and curettage   Informed consent: discussed and consent obtained    Timeout:  patient name, date of birth, surgical site, and procedure verified Procedure prep:  Patient was prepped and draped in usual sterile fashion Prep type:  Isopropyl alcohol Anesthesia: the lesion was anesthetized in a standard fashion   Anesthetic:  1% lidocaine w/ epinephrine 1-100,000 buffered w/ 8.4% NaHCO3 Curettage performed in three different directions: Yes   Electrodesiccation performed over the curetted area: Yes   Lesion length (cm):  2.2 Lesion width (cm):  2.2 Margin per side (cm):  0.2 Final wound size (cm):  2.6 Hemostasis achieved with:  pressure, aluminum chloride and electrodesiccation Outcome: patient tolerated procedure well with no complications   Post-procedure details: sterile dressing applied and wound care instructions given   Dressing type: bandage and petrolatum    Specimen 3 - Surgical pathology Differential Diagnosis: R/O Metastatic Lung cancer vs SCC Check Margins: No 2.2 cm pink crusted papule  Prurigo nodularis arms, back, chest  Biopsy proven Prurigo Nodularis  Start otc Amlactin lotion apply to skin dailyb   SCC (squamous cell carcinoma), trunk   Actinic Damage - diffuse scaly erythematous macules with underlying dyspigmentation - Recommend daily broad spectrum sunscreen SPF 30+ to sun-exposed areas, reapply every 2 hours as needed.  - Call for new or changing lesions.  Return in about 6 weeks (around 02/18/2020). Marta Lamas, CMA, am acting as scribe for Sarina Ser, MD .  Documentation: I have  reviewed the above documentation for accuracy and completeness, and I agree with the above.  Sarina Ser, MD

## 2020-01-07 NOTE — Patient Instructions (Signed)
Wound Care Instructions  On the day following your surgery, you should begin doing daily dressing changes: 1. Remove the old dressing and discard it. 2. Cleanse the wound gently with tap water. This may be done in the shower or by placing a wet gauze pad directly on the wound and letting it soak for several minutes. 3. It is important to gently remove any dried blood from the wound in order to encourage healing. This may be done by gently rolling a moistened Q-tip on the dried blood. Do not pick at the wound. 4. If the wound should start to bleed, continue cleaning the wound, then place a moist gauze pad on the wound and hold pressure for a few minutes.  5. Make sure you then dry the skin surrounding the wound completely or the tape will not stick to the skin. Do not use cotton balls on the wound. 6. After the wound is clean and dry, apply the ointment gently with a Q-tip. 7. Cut a non-stick pad to fit the size of the wound. Lay the pad flush to the wound. If the wound is draining, you may want to reinforce it with a small amount of gauze on top of the non-stick pad for a little added compression to the area. 8. Use the tape to seal the area completely. 9. Select from the following with respect to your individual situation: 1. If your wound has been stitched closed: continue the above steps 1-8 at least daily until your sutures are removed. 2. If your wound has been left open to heal: continue steps 1-8 at least daily for the first 3-4 weeks. 10. We would like for you to take a few extra precautions for at least the next week. 1. Sleep with your head elevated on pillows if our wound is on your head. 2. Do not bend over or lift heavy items to reduce the chance of elevated blood pressure to the wound 3. Do not participate in particularly strenuous activities.   Below is a list of dressing supplies you might need.  . Cotton-tipped applicators - Q-tips . Gauze pads (2x2 and/or 4x4) - All-Purpose  Sponges . Non-stick dressing material - Telfa . Tape - Paper or Hypafix . New and clean tube of petroleum jelly - Vaseline    Comments on Post-Operative Period 1. Slight swelling and redness often appear around the wound. This is normal and will disappear within several days following the surgery. 2. The healing wound will drain a brownish-red-yellow discharge during healing. This is a normal phase of wound healing. As the wound begins to heal, the drainage may increase in amount. Again, this drainage is normal. 3. Notify us if the drainage becomes persistently bloody, excessively swollen, or intensely painful or develops a foul odor or red streaks.  4. If you should experience mild discomfort during the healing phase, you may take an aspirin-free medication such as Tylenol (acetaminophen). Notify us if the discomfort is severe or persistent. Avoid alcoholic beverages when taking pain medicine.  In Case of Wound Hemorrhage A wound hemorrhage is when the bandage suddenly becomes soaked with bright red blood and flows profusely. If this happens, sit down or lie down with your head elevated. If the wound has a dressing on it, do not remove the dressing. Apply pressure to the existing gauze. If the wound is not covered, use a gauze pad to apply pressure and continue applying the pressure for 20 minutes without peeking. DO NOT COVER THE WOUND WITH  A LARGE TOWEL OR [place] CLOTH. Release your hand from the wound site but do not remove the dressing. If the bleeding has stopped, gently clean around the wound. Leave the dressing in place for 24 hours if possible. This wait time allows the blood vessels to close off so that you do not spark a new round of bleeding by disrupting the newly clotted blood vessels with an immediate dressing change. If the bleeding does not subside, continue to hold pressure. If matters are out of your control, contact an After Hours clinic or go to the Emergency Room.   Start over the  counter Amlactin lotion to body daily

## 2020-01-08 ENCOUNTER — Encounter: Payer: Self-pay | Admitting: Dermatology

## 2020-01-14 ENCOUNTER — Telehealth: Payer: Self-pay

## 2020-01-14 NOTE — Telephone Encounter (Signed)
No answer and no voicemail box.

## 2020-01-15 ENCOUNTER — Telehealth: Payer: Self-pay

## 2020-01-15 ENCOUNTER — Ambulatory Visit
Admission: RE | Admit: 2020-01-15 | Discharge: 2020-01-15 | Disposition: A | Discharge status: Home / Self Care | Payer: Medicare Other | Source: Ambulatory Visit | Attending: Radiation Oncology | Admitting: Radiation Oncology

## 2020-01-15 ENCOUNTER — Other Ambulatory Visit: Payer: Self-pay

## 2020-01-15 DIAGNOSIS — C3412 Malignant neoplasm of upper lobe, left bronchus or lung: Secondary | ICD-10-CM | POA: Diagnosis present

## 2020-01-15 MED ORDER — IOHEXOL 300 MG/ML  SOLN
75.0000 mL | Freq: Once | INTRAMUSCULAR | Status: AC | PRN
  Administered 2020-01-15: 75 mL via INTRAVENOUS

## 2020-01-15 NOTE — Telephone Encounter (Signed)
LM on VM to return my call. 

## 2020-01-15 NOTE — Telephone Encounter (Signed)
Patient advised of biopsy results. He will keep follow up appointment.

## 2020-01-15 NOTE — Telephone Encounter (Signed)
error 

## 2020-01-21 ENCOUNTER — Ambulatory Visit
Admission: RE | Admit: 2020-01-21 | Discharge: 2020-01-21 | Disposition: A | Discharge status: Home / Self Care | Payer: Medicare Other | Source: Ambulatory Visit | Attending: Radiation Oncology | Admitting: Radiation Oncology

## 2020-01-21 ENCOUNTER — Other Ambulatory Visit: Payer: Self-pay

## 2020-01-21 DIAGNOSIS — Z923 Personal history of irradiation: Secondary | ICD-10-CM | POA: Diagnosis not present

## 2020-01-21 DIAGNOSIS — R911 Solitary pulmonary nodule: Secondary | ICD-10-CM | POA: Diagnosis not present

## 2020-01-21 DIAGNOSIS — C3412 Malignant neoplasm of upper lobe, left bronchus or lung: Secondary | ICD-10-CM | POA: Diagnosis not present

## 2020-01-21 DIAGNOSIS — C771 Secondary and unspecified malignant neoplasm of intrathoracic lymph nodes: Secondary | ICD-10-CM | POA: Diagnosis present

## 2020-01-21 NOTE — Progress Notes (Signed)
Radiation Oncology Follow up Note  Name: Steve Gregory   Date:   01/21/2020 MRN:  [id number] DOB: 19-May-1956    This 64 y.o. male presents to the clinic today for 22-month follow-up status post SBRT for progressive prevascular lymph node in patient with known small cell lung cancer.  REFERRING PROVIDER: Kirk Ruths, MD  HPI: Patient is a 64 year old male now out 10 months having completed salvage SBRT treatment for progressive prevascular lymph node in the patient with known small cell lung cancer seen today in routine follow-up he is doing well specifically denies cough hemoptysis or chest tightness.Marland Kitchen  He recently had CT scan this month showing treated left upper lobe lesion slightly decreased in size from November 2020.  There was a new indeterminate subpleural nodule in the lateral aspect of the right upper lobe.  This will be followed.  COMPLICATIONS OF TREATMENT: none  FOLLOW UP COMPLIANCE: keeps appointments   PHYSICAL EXAM:  BP (!) (P) 152/79 (BP Location: Left Arm, Patient Position: Sitting)   Pulse (P) 94   Resp (P) 16   Wt (P) 164 lb 9.6 oz (74.7 kg)   BMI (P) 23.62 kg/m  Well-developed well-nourished patient in NAD. HEENT reveals PERLA, EOMI, discs not visualized.  Oral cavity is clear. No oral mucosal lesions are identified. Neck is clear without evidence of cervical or supraclavicular adenopathy. Lungs are clear to A&P. Cardiac examination is essentially unremarkable with regular rate and rhythm without murmur rub or thrill. Abdomen is benign with no organomegaly or masses noted. Motor sensory and DTR levels are equal and symmetric in the upper and lower extremities. Cranial nerves II through XII are grossly intact. Proprioception is intact. No peripheral adenopathy or edema is identified. No motor or sensory levels are noted. Crude visual fields are within normal range.  RADIOLOGY RESULTS: CT scan reviewed compatible with above-stated findings  PLAN: Present time  patient is clinically doing well.  The new indeterminate subpleural nodule in the l lateral aspect of the right upper lobe will be followed up with CT scan in 6 months which I will review.  Otherwise and pleased with overall progress.  Continues close follow-up care with medical oncology.  Patient knows to call with any concerns.  I would like to take this opportunity to thank you for allowing me to participate in the care of your patient.Noreene Filbert, MD

## 2020-02-18 ENCOUNTER — Encounter: Payer: Self-pay | Admitting: Dermatology

## 2020-02-18 ENCOUNTER — Ambulatory Visit (INDEPENDENT_AMBULATORY_CARE_PROVIDER_SITE_OTHER): Payer: Medicare Other | Admitting: Dermatology

## 2020-02-18 ENCOUNTER — Other Ambulatory Visit: Payer: Self-pay

## 2020-02-18 DIAGNOSIS — L821 Other seborrheic keratosis: Secondary | ICD-10-CM | POA: Diagnosis not present

## 2020-02-18 DIAGNOSIS — D692 Other nonthrombocytopenic purpura: Secondary | ICD-10-CM

## 2020-02-18 DIAGNOSIS — L820 Inflamed seborrheic keratosis: Secondary | ICD-10-CM | POA: Diagnosis not present

## 2020-02-18 DIAGNOSIS — B079 Viral wart, unspecified: Secondary | ICD-10-CM

## 2020-02-18 DIAGNOSIS — L578 Other skin changes due to chronic exposure to nonionizing radiation: Secondary | ICD-10-CM

## 2020-02-18 DIAGNOSIS — Z85828 Personal history of other malignant neoplasm of skin: Secondary | ICD-10-CM | POA: Diagnosis not present

## 2020-02-18 DIAGNOSIS — D229 Melanocytic nevi, unspecified: Secondary | ICD-10-CM

## 2020-02-18 NOTE — Progress Notes (Signed)
Follow-Up Visit   Subjective  Steve Gregory is a 64 y.o. male who presents for the following: Follow-up. The patient presents for Upper Body Skin Exam (UBSE) for skin cancer screening and mole check.  Patient presents today for a follow up in [place] 01/07/20 for Bx proven SCC R mid volar forearm, Mid back spinal, also Bx proven Wart mid to low back lateral  The following portions of the chart were reviewed this encounter and updated as appropriate:  Tobacco  Allergies  Meds  Problems  Med Hx  Surg Hx  Fam Hx      Review of Systems:  No other skin or systemic complaints except as noted in HPI or Assessment and Plan.  Objective  Well appearing patient in no apparent distress; mood and affect are within normal limits.  A focused examination was performed including back and arms. Relevant physical exam findings are noted in the Assessment and Plan. skin waist up examined.  Objective  Righ Mid Volar Forearm, Right Mid Back: Well healed scar with no evidence of recurrence, no lymphadenopathy.   Objective  Mid to Low Back Lateral to spine: Verrucous papules -- Discussed viral etiology and contagion.   Objective  Arms and hands x 14 (14): Erythematous keratotic or waxy stuck-on papule or plaque.    Assessment & Plan    History of SCC (squamous cell carcinoma) of skin (2) Righ Mid Volar Forearm; Right Mid Back  Bx proven  with [facility] 01/07/20  Clear. Observe for recurrence. Call clinic for new or changing lesions.  Recommend regular skin exams, daily broad-spectrum spf 30+ sunscreen use, and photoprotection.     Viral warts, unspecified type Mid to Low Back Lateral to spine  Bx proven 01/07/20  Clear. Observe for recurrence. Call clinic for new or changing lesions.  Recommend regular skin exams, daily broad-spectrum spf 30+ sunscreen use, and photoprotection.     Inflamed seborrheic keratosis (14) Arms and hands x 14  Cryotherapy today Prior to procedure, discussed  risks of blister formation, small wound, skin dyspigmentation, or rare scar following cryotherapy.    Destruction of lesion - Arms and hands x 14 Complexity: simple   Destruction method: cryotherapy   Informed consent: discussed and consent obtained   Timeout:  patient name, date of birth, surgical site, and procedure verified Lesion destroyed using liquid nitrogen: Yes   Region frozen until ice ball extended beyond lesion: Yes   Outcome: patient tolerated procedure well with no complications   Post-procedure details: wound care instructions given    Seborrheic Keratoses - Stuck-on, waxy, tan-brown papules and plaques  - Discussed benign etiology and prognosis. - Observe - Call for any changes  Actinic Damage - diffuse scaly erythematous macules with underlying dyspigmentation - Recommend daily broad spectrum sunscreen SPF 30+ to sun-exposed areas, reapply every 2 hours as needed.  - Call for new or changing lesions.  Purpura - Violaceous macules and patches - Benign - Related to age, sun damage and/or use of blood thinners - Observe - Can use OTC arnica containing moisturizer such as Dermend Bruise Formula if desired - Call for worsening or other concerns  Melanocytic Nevi - Tan-brown and/or pink-flesh-colored symmetric macules and papules - Benign appearing on exam today - Observation - Call clinic for new or changing moles - Recommend daily use of broad spectrum spf 30+ sunscreen to sun-exposed areas.    Return in about 8 months (around 10/28/2020) for UBSE.   I, Donzetta Kohut, CMA, am acting as  scribe for Sarina Ser, MD .  Documentation: I have reviewed the above documentation for accuracy and completeness, and I agree with the above.  Sarina Ser, MD

## 2020-02-18 NOTE — Patient Instructions (Signed)
Cryotherapy Aftercare  . Wash gently with soap and water everyday.   Marland Kitchen Apply Vaseline and Band-Aid daily until healed.  Prior to procedure, discussed risks of blister formation, small wound, skin dyspigmentation, or rare scar following cryotherapy.

## 2020-02-20 ENCOUNTER — Encounter: Payer: Self-pay | Admitting: Dermatology

## 2020-02-20 IMAGING — CT CT CHEST W/ CM
2 of 3 series · 15 of 36 positions shown, 18 images · IV contrast (omnipaque)
Comparison: 02/13/2018

CLINICAL DATA: Non-small-cell lung cancer. Primary radiation
therapy. Bilateral upper lobe primaries.

EXAM:
CT CHEST WITH CONTRAST
TECHNIQUE: Multidetector CT imaging of the chest was performed during
intravenous contrast administration.
CONTRAST:  75mL OMNIPAQUE IOHEXOL 300 MG/ML  SOLN

[Series 2: axial st · axial · 0.67mm/px · z∈[-603,-317]mm · 12 of 169 slices shown, 15 images]
[im 13/169  mediastinal]
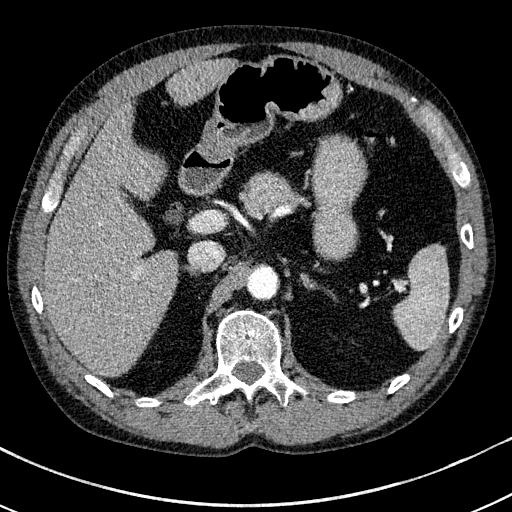
[im 13/169  lung]
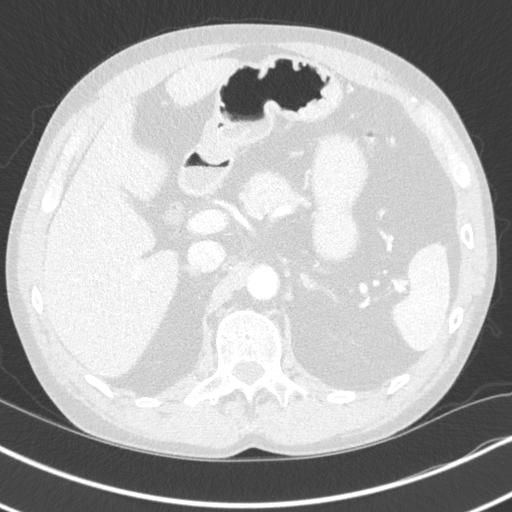
[im 25/169  lung]
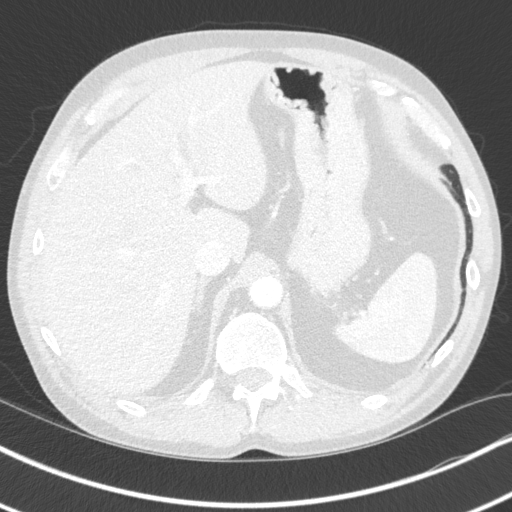
[im 38/169  lung]
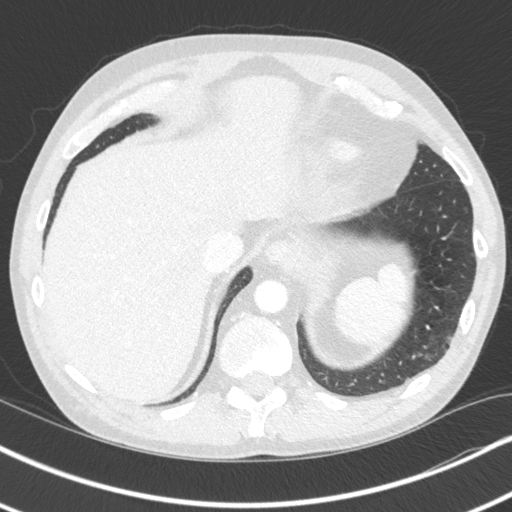
[im 50/169  lung]
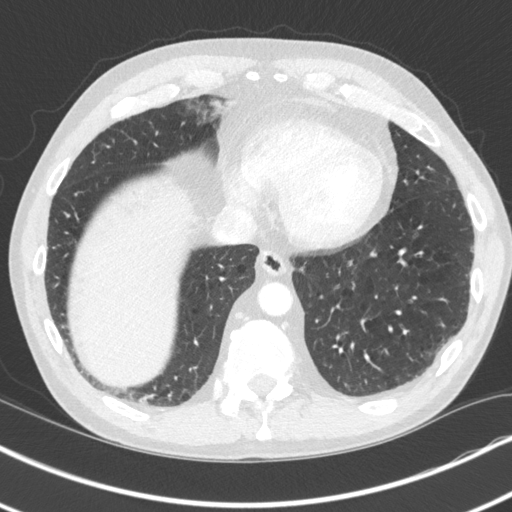
[im 63/169  mediastinal]
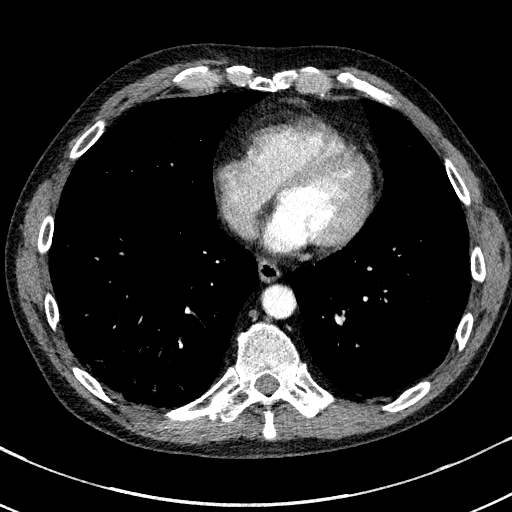
[im 63/169  lung]
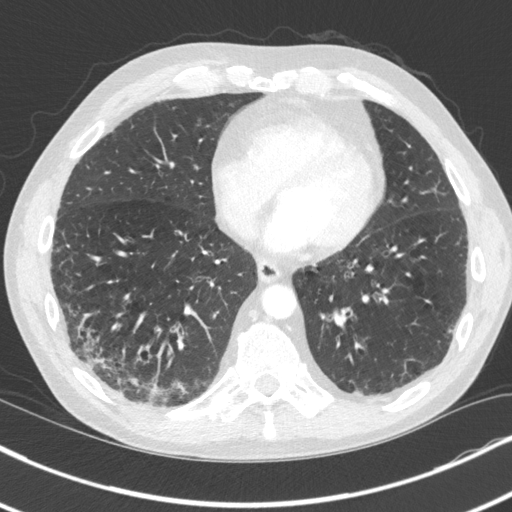
[im 75/169  lung]
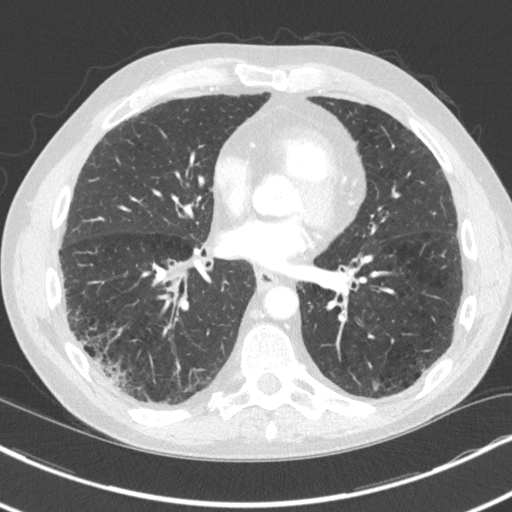
[im 94/169  lung]
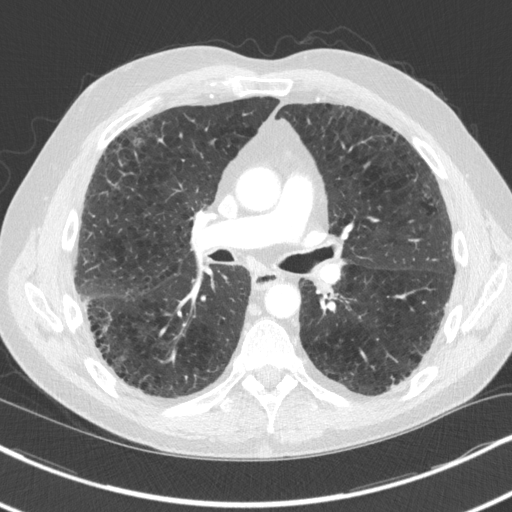
[im 106/169  lung]
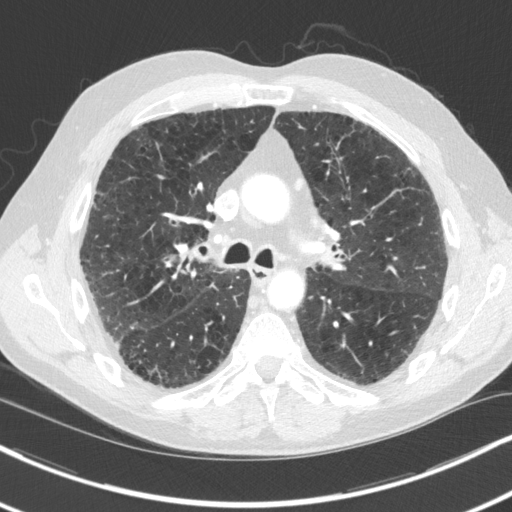
[im 119/169  mediastinal]
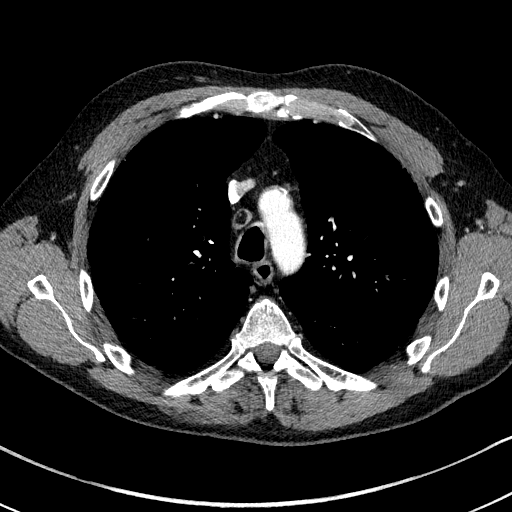
[im 119/169  lung]
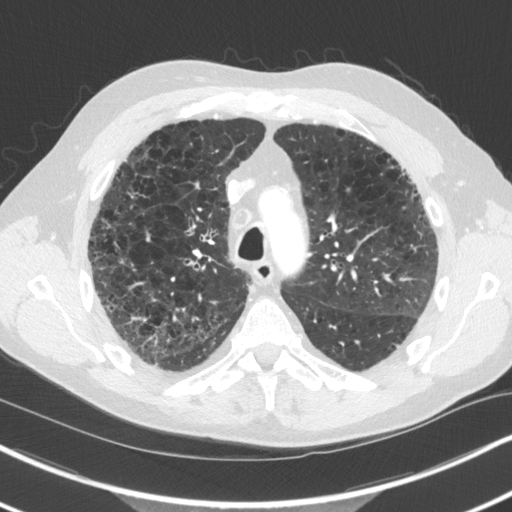
[im 131/169  lung]
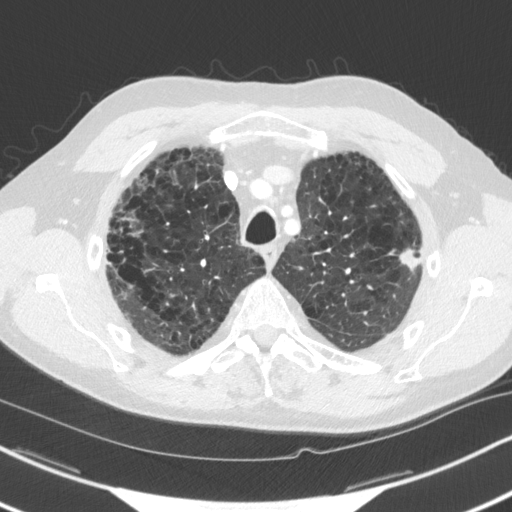
[im 144/169  lung]
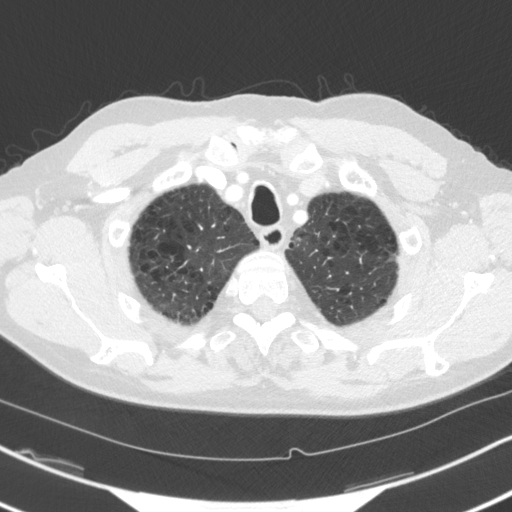
[im 156/169  lung]
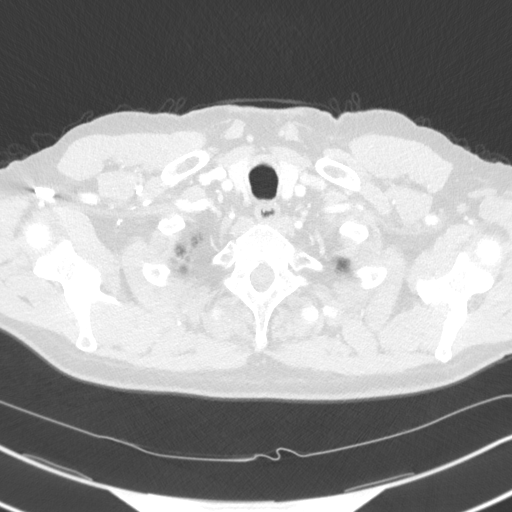

[Series 5: coronal · coronal · 0.68mm/px · 3 of 131 slices shown]
[im 27/131  lung]
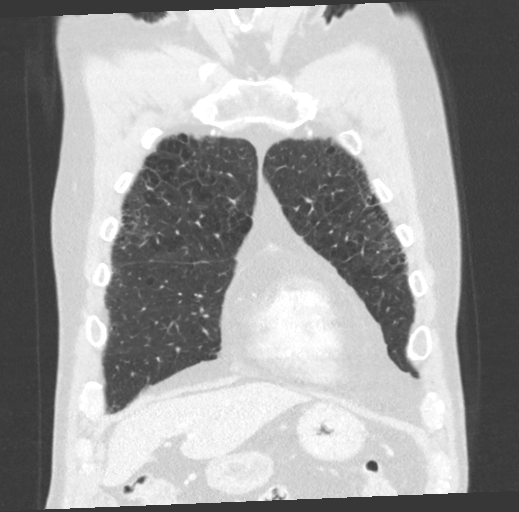
[im 53/131  lung]
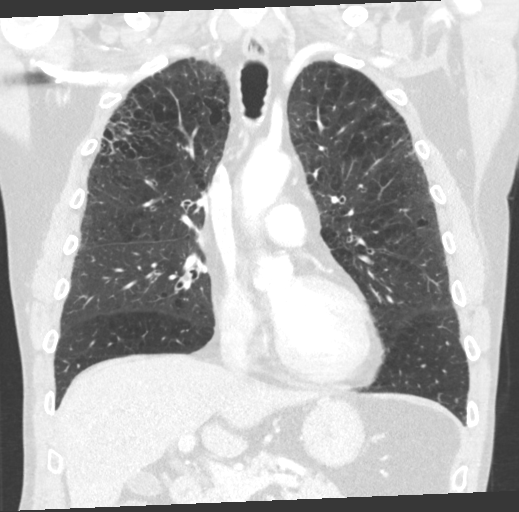
[im 79/131  lung]
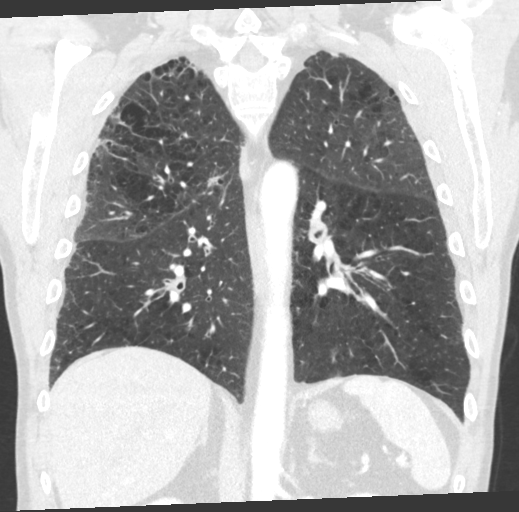

[15 of 36 positions shown; findings below may reference images not displayed]

FINDINGS: Cardiovascular: Aortic and branch vessel atherosclerosis. Normal
heart size, without pericardial effusion. No central pulmonary
embolism, on this non-dedicated study.

Mediastinum/Nodes: No supraclavicular adenopathy. A right hilar node
is similar and upper normal size at 11 mm. A left suprahilar node
versus less likely central pulmonary nodule measures 9 mm on image
58/2 versus 5 mm on image 65/2 of the prior.

Lungs/Pleura: No pleural fluid.  Moderate centrilobular emphysema.

Lower lobe predominant bronchial wall thickening. Basilar
predominant subpleural interstitial thickening is worse on the right
and slightly increased.

No residual right upper lobe pulmonary nodule identified at the site
of 6 mm nodule on the prior.

Right lower lobe pulmonary nodule of 5 mm on image 106/3, similar on
the prior and unchanged to 09/28/2016, favoring a benign etiology.

Decrease in irregular subpleural left upper lobe pulmonary nodule.
Example 1.3 x 1.4 cm on image 39/3. Compare maximally 2.2 x 2.0 cm
on the prior.

Upper Abdomen: Scattered low-density liver lesions are favored to
represent cysts. Technically too small to characterize. Normal
imaged portions of the spleen, pancreas, gallbladder, kidneys. Left
greater than right adrenal nodularity is mild and similar. Similar
appearance of the proximal stomach, with underdistention and
apparent greater curvature wall thickening.

Musculoskeletal: No acute osseous abnormality.
IMPRESSION: 1. Response to therapy of left upper lobe pulmonary nodule.
2. Left suprahilar node versus less likely central left upper lobe
pulmonary nodule has enlarged in the interval. Suspicious for nodal
metastasis. Consider short-term follow-up chest CT at 3 months
versus further evaluation with PET.
3. No residual nodule at the right upper lobe site of treated
malignancy.
4. Aortic atherosclerosis (GFT5B-9PN.N), coronary artery
atherosclerosis and emphysema (GFT5B-OTC.A).
5. Suspect interstitial lung disease as evidenced by subpleural
reticulation at the right greater than left bases. This could be
post infectious/inflammatory, or represent nonspecific interstitial
pneumonitis.
6. Similar appearance of the stomach, with underdistention and
possible greater curvature wall thickening. Correlate with symptoms
of gastritis.

## 2020-05-07 ENCOUNTER — Other Ambulatory Visit: Payer: Self-pay | Admitting: *Deleted

## 2020-05-07 DIAGNOSIS — C3412 Malignant neoplasm of upper lobe, left bronchus or lung: Secondary | ICD-10-CM

## 2020-06-05 ENCOUNTER — Other Ambulatory Visit: Payer: Self-pay

## 2020-06-05 ENCOUNTER — Other Ambulatory Visit
Admission: RE | Admit: 2020-06-05 | Discharge: 2020-06-05 | Disposition: A | Discharge status: Home / Self Care | Payer: Medicare Other | Source: Ambulatory Visit | Attending: Gastroenterology | Admitting: Gastroenterology

## 2020-06-05 DIAGNOSIS — Z01812 Encounter for preprocedural laboratory examination: Secondary | ICD-10-CM | POA: Diagnosis present

## 2020-06-05 DIAGNOSIS — Z20822 Contact with and (suspected) exposure to covid-19: Secondary | ICD-10-CM | POA: Insufficient documentation

## 2020-06-05 LAB — SARS CORONAVIRUS 2 (TAT 6-24 HRS): SARS Coronavirus 2: NEGATIVE

## 2020-06-09 ENCOUNTER — Encounter
Admission: RE | Disposition: A | Discharge status: Home / Self Care | Payer: Self-pay | Source: Home / Self Care | Attending: Gastroenterology

## 2020-06-09 ENCOUNTER — Encounter: Payer: Self-pay | Admitting: Anesthesiology

## 2020-06-09 ENCOUNTER — Ambulatory Visit: Payer: Medicare Other | Admitting: Anesthesiology

## 2020-06-09 ENCOUNTER — Other Ambulatory Visit: Payer: Self-pay

## 2020-06-09 ENCOUNTER — Ambulatory Visit
Admission: RE | Admit: 2020-06-09 | Discharge: 2020-06-09 | Disposition: A | Discharge status: Home / Self Care | Payer: Medicare Other | Attending: Gastroenterology | Admitting: Gastroenterology

## 2020-06-09 DIAGNOSIS — K2270 Barrett's esophagus without dysplasia: Secondary | ICD-10-CM | POA: Diagnosis not present

## 2020-06-09 DIAGNOSIS — Z85118 Personal history of other malignant neoplasm of bronchus and lung: Secondary | ICD-10-CM | POA: Insufficient documentation

## 2020-06-09 DIAGNOSIS — Z888 Allergy status to other drugs, medicaments and biological substances status: Secondary | ICD-10-CM | POA: Insufficient documentation

## 2020-06-09 DIAGNOSIS — K640 First degree hemorrhoids: Secondary | ICD-10-CM | POA: Diagnosis not present

## 2020-06-09 DIAGNOSIS — K621 Rectal polyp: Secondary | ICD-10-CM | POA: Diagnosis not present

## 2020-06-09 DIAGNOSIS — K219 Gastro-esophageal reflux disease without esophagitis: Secondary | ICD-10-CM | POA: Insufficient documentation

## 2020-06-09 DIAGNOSIS — Z1211 Encounter for screening for malignant neoplasm of colon: Secondary | ICD-10-CM | POA: Insufficient documentation

## 2020-06-09 DIAGNOSIS — D123 Benign neoplasm of transverse colon: Secondary | ICD-10-CM | POA: Insufficient documentation

## 2020-06-09 DIAGNOSIS — I739 Peripheral vascular disease, unspecified: Secondary | ICD-10-CM | POA: Diagnosis not present

## 2020-06-09 DIAGNOSIS — Z7952 Long term (current) use of systemic steroids: Secondary | ICD-10-CM | POA: Diagnosis not present

## 2020-06-09 DIAGNOSIS — M1990 Unspecified osteoarthritis, unspecified site: Secondary | ICD-10-CM | POA: Insufficient documentation

## 2020-06-09 DIAGNOSIS — Z886 Allergy status to analgesic agent status: Secondary | ICD-10-CM | POA: Diagnosis not present

## 2020-06-09 DIAGNOSIS — F17200 Nicotine dependence, unspecified, uncomplicated: Secondary | ICD-10-CM | POA: Diagnosis not present

## 2020-06-09 DIAGNOSIS — Z79899 Other long term (current) drug therapy: Secondary | ICD-10-CM | POA: Insufficient documentation

## 2020-06-09 DIAGNOSIS — Z7951 Long term (current) use of inhaled steroids: Secondary | ICD-10-CM | POA: Insufficient documentation

## 2020-06-09 DIAGNOSIS — I10 Essential (primary) hypertension: Secondary | ICD-10-CM | POA: Insufficient documentation

## 2020-06-09 DIAGNOSIS — J449 Chronic obstructive pulmonary disease, unspecified: Secondary | ICD-10-CM | POA: Diagnosis not present

## 2020-06-09 DIAGNOSIS — Z86010 Personal history of colonic polyps: Secondary | ICD-10-CM | POA: Diagnosis not present

## 2020-06-09 HISTORY — PX: ESOPHAGOGASTRODUODENOSCOPY (EGD) WITH PROPOFOL: SHX5813

## 2020-06-09 HISTORY — PX: COLONOSCOPY WITH PROPOFOL: SHX5780

## 2020-06-09 SURGERY — COLONOSCOPY WITH PROPOFOL
Anesthesia: General

## 2020-06-09 MED ORDER — LIDOCAINE HCL (PF) 1 % IJ SOLN
INTRAMUSCULAR | Status: AC
  Filled 2020-06-09: qty 2

## 2020-06-09 MED ORDER — PROPOFOL 500 MG/50ML IV EMUL
INTRAVENOUS | Status: AC
  Filled 2020-06-09: qty 50

## 2020-06-09 MED ORDER — SODIUM CHLORIDE 0.9 % IV SOLN
INTRAVENOUS | Status: DC
  Administered 2020-06-09: 1000 mL via INTRAVENOUS

## 2020-06-09 MED ORDER — PHENYLEPHRINE HCL (PRESSORS) 10 MG/ML IV SOLN
INTRAVENOUS | Status: AC
  Filled 2020-06-09: qty 1

## 2020-06-09 MED ORDER — PROPOFOL 500 MG/50ML IV EMUL
INTRAVENOUS | Status: DC | PRN
  Administered 2020-06-09: 150 ug/kg/min via INTRAVENOUS

## 2020-06-09 MED ORDER — PHENYLEPHRINE HCL (PRESSORS) 10 MG/ML IV SOLN
INTRAVENOUS | Status: DC | PRN
  Administered 2020-06-09 (×4): 50 ug via INTRAVENOUS

## 2020-06-09 NOTE — Anesthesia Preprocedure Evaluation (Signed)
Anesthesia Evaluation  Patient identified by MRN, date of birth, ID band Patient awake    Reviewed: Allergy & Precautions, H&P , NPO status , Patient's Chart, lab work & pertinent test results, reviewed documented beta blocker date and time   Airway Mallampati: II   Neck ROM: full    Dental  (+) Poor Dentition   Pulmonary shortness of breath, pneumonia, resolved, COPD, Current Smoker and Patient abstained from smoking.,    Pulmonary exam normal        Cardiovascular Exercise Tolerance: Poor hypertension, On Medications + Peripheral Vascular Disease and + Orthopnea  Normal cardiovascular exam Rhythm:regular Rate:Normal     Neuro/Psych  Headaches, Seizures -,  PSYCHIATRIC DISORDERS Anxiety Depression  Neuromuscular disease    GI/Hepatic Neg liver ROS, hiatal hernia, GERD  Medicated,  Endo/Other  negative endocrine ROS  Renal/GU Renal disease  negative genitourinary   Musculoskeletal   Abdominal   Peds  Hematology negative hematology ROS (+)   Anesthesia Other Findings Past Medical History: No date: Allergy No date: Anxiety No date: Arthritis No date: Barrett's esophagus No date: Bronchitis, chronic (HCC) No date: Cancer ([place])     Comment:  LUNG No date: COPD (chronic obstructive pulmonary disease) (HCC) No date: Cough     Comment:  CHRONIC No date: Depression No date: Dizziness No date: Dysphagia 06/28/2017: Essential hypertension No date: GERD (gastroesophageal reflux disease) No date: H/O emphysema No date: Headache 06/05/2014: History of depression     Comment:  Last Assessment & Plan:  Mood is doing well on               medications.  No date: History of hiatal hernia No date: HOH (hard of hearing) No date: Hypertension No date: Lung mass No date: Maxillary fracture (HCC)     Comment:  LEFT SIDE No date: Orthopnea No date: Oxygen decrease     Comment:  H/O HOME USE, NOT NOW No date: Personal  history of colonic polyps No date: Pneumonia     Comment:  PSEUDOMONAL PNEUMONIA IN THE PAST 06/05/2014: PVD (peripheral vascular disease) ([place])     Comment:  Overview:  Smoker with poor pulses  Last Assessment &               Plan:  No leg pain is noted of late.  No date: Renal failure     Comment:  PRERENAL No date: Seizures ([place])     Comment:  sounds like it could be vagal at times, coughing can               cause him to fade out and see white lights. had last sz 3              months ago when not taking meds No date: Shortness of breath dyspnea     Comment:  uses many different inhalers 01/07/2020: Squamous cell carcinoma of skin     Comment:  right mid volar forearm, mid back spinal 06/28/2017: Varicose veins of both lower extremities with pain No date: Wheezing Past Surgical History: 10/27/2015: CATARACT EXTRACTION W/PHACO; Right     Comment:  Procedure: CATARACT EXTRACTION PHACO AND INTRAOCULAR               LENS PLACEMENT ([place]) suture placed in right eye at end of              procedure;  Surgeon: Estill Cotta, MD;  Location:  [facility] ORS;  Service: Ophthalmology;  Laterality: Right;                [place] 01:02 AP% 24.4 CDE 28.12 fluid pack lot # [id number] 11/17/2015: CATARACT EXTRACTION W/PHACO; Left     Comment:  Procedure: CATARACT EXTRACTION PHACO AND INTRAOCULAR               LENS PLACEMENT ([facility]);  Surgeon: Estill Cotta, MD;                Location: [facility] ORS;  Service: Ophthalmology;  Laterality:              Left;  [place] 01:29 AP% 24.3 CDE 40.05 fluid pack lot #               [id number] 07/28/2017: COLONOSCOPY WITH PROPOFOL; N/A     Comment:  Procedure: COLONOSCOPY WITH PROPOFOL;  Surgeon:               [name], MD;  Location: [facility] ENDOSCOPY;                Service: Endoscopy;  Laterality: N/A; 06/06/2017: ESOPHAGOGASTRODUODENOSCOPY (EGD) WITH PROPOFOL; N/A     Comment:  Procedure: ESOPHAGOGASTRODUODENOSCOPY (EGD) WITH               PROPOFOL;   Surgeon: [name], MD;  Location:               [facility] ENDOSCOPY;  Service: Endoscopy;  Laterality: N/A; No date: EYE SURGERY 1980: FACIAL RECONSTRUCTION SURGERY     Comment:  LEFT CHEEK BONE INJURY 1980 BMI    Body Mass Index: 22.53 kg/m     Reproductive/Obstetrics negative OB ROS                             Anesthesia Physical Anesthesia Plan  ASA: III  Anesthesia Plan: General   Post-op Pain Management:    Induction:   PONV Risk Score and Plan:   Airway Management Planned:   Additional Equipment:   Intra-op Plan:   Post-operative Plan:   Informed Consent: I have reviewed the patients History and Physical, chart, labs and discussed the procedure including the risks, benefits and alternatives for the proposed anesthesia with the patient or authorized representative who has indicated his/her understanding and acceptance.     Dental Advisory Given  Plan Discussed with: CRNA  Anesthesia Plan Comments:         Anesthesia Quick Evaluation

## 2020-06-09 NOTE — H&P (Signed)
Outpatient short stay form Pre-procedure 06/09/2020 12:50 PM Steve Miyamoto MD, MPH  Primary Physician: Dr. Ouida Sills  Reason for visit:  Barrett's Surveillance/Hx of polyps  History of present illness:   64 y/o gentleman with history of Barrett's without dysplasia and colon adenomas here for surveillance EGD and colonoscopy. Having issues with heartburn. No blood thinners. History of lung cancer and is under monitoring for this.    Current Facility-Administered Medications:  .  0.9 %  sodium chloride infusion, , Intravenous, Continuous, Akaash Vandewater, Hilton Cork, MD, Last Rate: 20 mL/hr at 06/09/20 1239, 1,000 mL at 06/09/20 1239 .  lidocaine (PF) (XYLOCAINE) 1 % injection, , , ,   Medications Prior to Admission  Medication Sig Dispense Refill Last Dose  . pantoprazole (PROTONIX) 40 MG tablet Take 40 mg by mouth 2 (two) times daily before a meal.     . albuterol (PROVENTIL) (2.5 MG/3ML) 0.083% nebulizer solution Take 2.5 mg by nebulization every 6 (six) hours as needed for wheezing or shortness of breath.   06/09/2020 at 1000  . famotidine (PEPCID) 20 MG tablet Take 20 mg by mouth 2 (two) times daily.     . fluticasone (FLONASE) 50 MCG/ACT nasal spray Place 2 sprays into both nostrils daily.     . fluticasone-salmeterol (ADVAIR HFA) 115-21 MCG/ACT inhaler Inhale 2 puffs into the lungs 2 (two) times daily.   06/09/2020  . gabapentin (NEURONTIN) 100 MG capsule Take 200 mg by mouth 3 (three) times daily.      Marland Kitchen losartan (COZAAR) 50 MG tablet Take 50 mg by mouth daily.      . montelukast (SINGULAIR) 10 MG tablet Take 10 mg by mouth daily.      . predniSONE (DELTASONE) 10 MG tablet Take 50 mg daily--taper by 10 mg daily thereafter resume your chronic daily prednisone dose 15 tablet 0   . theophylline (UNIPHYL) 400 MG 24 hr tablet Take 400 mg by mouth daily.     . VENTOLIN HFA 108 (90 Base) MCG/ACT inhaler Inhale 2 puffs into the lungs every 6 (six) hours as needed for wheezing or shortness of  breath.         Allergies  Allergen Reactions  . Chantix [Varenicline] Swelling       . Lipitor [Atorvastatin] Swelling  . Mobic [Meloxicam] Other (See Comments)    Myalgia  . Statins Other (See Comments)    Myalgia     Past Medical History:  Diagnosis Date  . Allergy   . Anxiety   . Arthritis   . Barrett's esophagus   . Bronchitis, chronic ([place])   . Cancer (HCC)    LUNG  . COPD (chronic obstructive pulmonary disease) ([place])   . Cough    CHRONIC  . Depression   . Dizziness   . Dysphagia   . Essential hypertension 06/28/2017  . GERD (gastroesophageal reflux disease)   . H/O emphysema   . Headache   . History of depression 06/05/2014   Last Assessment & Plan:  Mood is doing well on medications.   . History of hiatal hernia   . HOH (hard of hearing)   . Hypertension   . Lung mass   . Maxillary fracture (HCC)    LEFT SIDE  . Orthopnea   . Oxygen decrease    H/O HOME USE, NOT NOW  . Personal history of colonic polyps   . Pneumonia    PSEUDOMONAL PNEUMONIA IN THE PAST  . PVD (peripheral vascular disease) ([place]) 06/05/2014   Overview:  Smoker with poor pulses  Last Assessment & Plan:  No leg pain is noted of late.   . Renal failure    PRERENAL  . Seizures (HCC)    sounds like it could be vagal at times, coughing can cause him to fade out and see white lights. had last sz 3 months ago when not taking meds  . Shortness of breath dyspnea    uses many different inhalers  . Squamous cell carcinoma of skin 01/07/2020   right mid volar forearm, mid back spinal  . Varicose veins of both lower extremities with pain 06/28/2017  . Wheezing     Review of systems:  Otherwise negative.    Physical Exam  Gen: Alert, oriented. Appears stated age.  HEENT: [place]/AT. PERRLA. Lungs: No respiratory distress Abd: soft, benign, no masses.  Ext: No edema.     Planned procedures: Proceed with EGD/colonoscopy. The patient understands the nature of the planned procedure,  indications, risks, alternatives and potential complications including but not limited to bleeding, infection, perforation, damage to internal organs and possible oversedation/side effects from anesthesia. The patient agrees and gives consent to proceed.  Please refer to procedure notes for findings, recommendations and patient disposition/instructions.     Steve Miyamoto MD, MPH Gastroenterology 06/09/2020  12:50 PM

## 2020-06-09 NOTE — Anesthesia Procedure Notes (Signed)
Performed by: Cook-Martin, Sophie Tamez Pre-anesthesia Checklist: Patient identified, Emergency Drugs available, Suction available, Patient being monitored and Timeout performed Patient Re-evaluated:Patient Re-evaluated prior to induction Oxygen Delivery Method: Nasal cannula Preoxygenation: Pre-oxygenation with 100% oxygen Induction Type: IV induction Airway Equipment and Method: Bite block Placement Confirmation: CO2 detector and positive ETCO2       

## 2020-06-09 NOTE — Op Note (Signed)
[facility] Gastroenterology Patient Name: Steve Gregory Procedure Date: 06/09/2020 12:07 PM MRN: [id number] Account #: [id number] Date of Birth: 09-12-1955 Admit Type: Outpatient Age: 64 Room: [facility] ENDO ROOM 1 Gender: Male Note Status: Finalized Procedure:             Colonoscopy Indications:           High risk colon cancer surveillance: Personal history                         of colonic polyps Providers:             Andrey Farmer MD, MD Medicines:             Monitored Anesthesia Care Complications:         No immediate complications. Estimated blood loss:                         Minimal. Procedure:             Pre-Anesthesia Assessment:                        - Prior to the procedure, a History and Physical was                         performed, and patient medications and allergies were                         reviewed. The patient is competent. The risks and                         benefits of the procedure and the sedation options and                         risks were discussed with the patient. All questions                         were answered and informed consent was obtained.                         Patient identification and proposed procedure were                         verified by the physician, the nurse, the anesthetist                         and the technician in the endoscopy suite. Mental                         Status Examination: alert and oriented. Airway                         Examination: normal oropharyngeal airway and neck                         mobility. Respiratory Examination: clear to                         auscultation. CV Examination: normal. Prophylactic  Antibiotics: The patient does not require prophylactic                         antibiotics. Prior Anticoagulants: The patient has                         taken no previous anticoagulant or antiplatelet                         agents. ASA Grade  Assessment: III - A patient with                         severe systemic disease. After reviewing the risks and                         benefits, the patient was deemed in satisfactory                         condition to undergo the procedure. The anesthesia                         plan was to use monitored anesthesia care (MAC).                         Immediately prior to administration of medications,                         the patient was re-assessed for adequacy to receive                         sedatives. The heart rate, respiratory rate, oxygen                         saturations, blood pressure, adequacy of pulmonary                         ventilation, and response to care were monitored                         throughout the procedure. The physical status of the                         patient was re-assessed after the procedure.                        After obtaining informed consent, the colonoscope was                         passed under direct vision. Throughout the procedure,                         the patient's blood pressure, pulse, and oxygen                         saturations were monitored continuously. The                         Colonoscope was introduced through the anus and  advanced to the the cecum, identified by appendiceal                         orifice and ileocecal valve. The colonoscopy was                         performed without difficulty. The patient tolerated                         the procedure well. The quality of the bowel                         preparation was good. Findings:      The perianal and digital rectal examinations were normal.      A 2 mm polyp was found in the hepatic flexure. The polyp was sessile.       The polyp was removed with a jumbo cold forceps. Resection and retrieval       were complete. Estimated blood loss was minimal.      A 1 mm polyp was found in the rectum. The polyp was sessile. The polyp        was removed with a jumbo cold forceps. Resection and retrieval were       complete. Estimated blood loss was minimal.      Non-bleeding internal hemorrhoids were found during retroflexion. The       hemorrhoids were Grade I (internal hemorrhoids that do not prolapse).      The exam was otherwise without abnormality on direct and retroflexion       views. Impression:            - One 2 mm polyp at the hepatic flexure, removed with                         a jumbo cold forceps. Resected and retrieved.                        - One 1 mm polyp in the rectum, removed with a jumbo                         cold forceps. Resected and retrieved.                        - Non-bleeding internal hemorrhoids.                        - The examination was otherwise normal on direct and                         retroflexion views. Recommendation:        - Discharge patient to home.                        - Resume previous diet.                        - Continue present medications.                        - Await pathology results.                        -  Repeat colonoscopy in 5 years for surveillance.                        - Return to referring physician as previously                         scheduled. Procedure Code(s):     --- Professional ---                        [phone], Colonoscopy, flexible; with biopsy, single or                         multiple Diagnosis Code(s):     --- Professional ---                        Z86.010, Personal history of colonic polyps                        K63.5, Polyp of colon                        K62.1, Rectal polyp                        K64.0, First degree hemorrhoids CPT copyright 2019 American [employer] Association. All rights reserved. The codes documented in this report are preliminary and upon coder review may  be revised to meet current compliance requirements. Andrey Farmer, MD Andrey Farmer MD, MD 06/09/2020 1:26:33 PM Number of Addenda: 0 Note  Initiated On: 06/09/2020 12:07 PM Scope Withdrawal Time: 0 hours 8 minutes 55 seconds  Total Procedure Duration: 0 hours 12 minutes 26 seconds  Estimated Blood Loss:  Estimated blood loss was minimal.      [facility]

## 2020-06-09 NOTE — Transfer of Care (Signed)
Immediate Anesthesia Transfer of Care Note  Patient: Steve Gregory  Procedure(s) Performed: COLONOSCOPY WITH PROPOFOL (N/A ) ESOPHAGOGASTRODUODENOSCOPY (EGD) WITH PROPOFOL (N/A )  Patient Location: PACU  Anesthesia Type:General  Level of Consciousness: awake and sedated  Airway & Oxygen Therapy: Patient Spontanous Breathing and Patient connected to nasal cannula oxygen  Post-op Assessment: Report given to RN and Post -op Vital signs reviewed and stable  Post vital signs: Reviewed and stable  Last Vitals:  Vitals Value Taken Time  BP    Temp    Pulse    Resp    SpO2      Last Pain:  Vitals:   06/09/20 1219  TempSrc: Temporal  PainSc: 0-No pain         Complications: No complications documented.

## 2020-06-09 NOTE — Op Note (Signed)
[facility] Gastroenterology Patient Name: Steve Gregory Procedure Date: 06/09/2020 12:07 PM MRN: [id number] Account #: [id number] Date of Birth: 1956-01-11 Admit Type: Outpatient Age: 64 Room: [facility] ENDO ROOM 1 Gender: Male Note Status: Finalized Procedure:             Upper GI endoscopy Indications:           Gastro-esophageal reflux disease, Barrett's esophagus Providers:             Andrey Farmer MD, MD Medicines:             Monitored Anesthesia Care Complications:         No immediate complications. Estimated blood loss:                         Minimal. Procedure:             Pre-Anesthesia Assessment:                        - Prior to the procedure, a History and Physical was                         performed, and patient medications and allergies were                         reviewed. The patient is competent. The risks and                         benefits of the procedure and the sedation options and                         risks were discussed with the patient. All questions                         were answered and informed consent was obtained.                         Patient identification and proposed procedure were                         verified by the physician, the nurse, the anesthetist                         and the technician in the endoscopy suite. Mental                         Status Examination: alert and oriented. Airway                         Examination: normal oropharyngeal airway and neck                         mobility. Respiratory Examination: clear to                         auscultation. CV Examination: normal. Prophylactic                         Antibiotics: The patient does not require prophylactic  antibiotics. Prior Anticoagulants: The patient has                         taken no previous anticoagulant or antiplatelet                         agents. ASA Grade Assessment: III - A patient with                          severe systemic disease. After reviewing the risks and                         benefits, the patient was deemed in satisfactory                         condition to undergo the procedure. The anesthesia                         plan was to use monitored anesthesia care (MAC).                         Immediately prior to administration of medications,                         the patient was re-assessed for adequacy to receive                         sedatives. The heart rate, respiratory rate, oxygen                         saturations, blood pressure, adequacy of pulmonary                         ventilation, and response to care were monitored                         throughout the procedure. The physical status of the                         patient was re-assessed after the procedure.                        After obtaining informed consent, the endoscope was                         passed under direct vision. Throughout the procedure,                         the patient's blood pressure, pulse, and oxygen                         saturations were monitored continuously. The Endoscope                         was introduced through the mouth, and advanced to the                         second part of duodenum. The upper GI endoscopy was  accomplished without difficulty. The patient tolerated                         the procedure well. Findings:      There were esophageal mucosal changes secondary to established       short-segment Barrett's disease present in the lower third of the       esophagus. The maximum longitudinal extent of these mucosal changes was       1 cm in length. Mucosa was biopsied with a cold forceps for histology in       4 quadrants at intervals of 2 cm in the lower third of the esophagus.       One specimen bottle was sent to pathology. Estimated blood loss was       minimal.      The exam of the esophagus was otherwise normal.       The entire examined stomach was normal.      The examined duodenum was normal. Impression:            - Esophageal mucosal changes secondary to established                         short-segment Barrett's disease. Biopsied.                        - Normal stomach.                        - Normal examined duodenum. Recommendation:        - Discharge patient to home.                        - Resume previous diet.                        - Continue present medications.                        - Await pathology results.                        - Repeat upper endoscopy for surveillance based on                         pathology results.                        - Return to referring physician as previously                         scheduled. Procedure Code(s):     --- Professional ---                        [phone], Esophagogastroduodenoscopy, flexible,                         transoral; with biopsy, single or multiple Diagnosis Code(s):     --- Professional ---                        K22.70, Barrett's esophagus without dysplasia  K21.9, Gastro-esophageal reflux disease without                         esophagitis CPT copyright 2019 American Medical Association. All rights reserved. The codes documented in this report are preliminary and upon coder review may  be revised to meet current compliance requirements. Andrey Farmer, MD Andrey Farmer MD, MD 06/09/2020 1:23:56 PM Number of Addenda: 0 Note Initiated On: 06/09/2020 12:07 PM Estimated Blood Loss:  Estimated blood loss was minimal.      [facility]

## 2020-06-10 ENCOUNTER — Encounter: Payer: Self-pay | Admitting: Gastroenterology

## 2020-06-10 LAB — SURGICAL PATHOLOGY

## 2020-06-10 NOTE — Anesthesia Postprocedure Evaluation (Signed)
Anesthesia Post Note  Patient: Steve Gregory  Procedure(s) Performed: COLONOSCOPY WITH PROPOFOL (N/A ) ESOPHAGOGASTRODUODENOSCOPY (EGD) WITH PROPOFOL (N/A )  Patient location during evaluation: PACU Anesthesia Type: General Level of consciousness: awake and alert Pain management: pain level controlled Vital Signs Assessment: post-procedure vital signs reviewed and stable Respiratory status: spontaneous breathing, nonlabored ventilation, respiratory function stable and patient connected to nasal cannula oxygen Cardiovascular status: blood pressure returned to baseline and stable Postop Assessment: no apparent nausea or vomiting Anesthetic complications: no   No complications documented.   Last Vitals:  Vitals:   06/09/20 1219 06/09/20 1325  BP: 138/83 97/62  Pulse: 94   Resp: 17   Temp: (!) 36.2 C 36.8 C  SpO2: 93% 92%    Last Pain:  Vitals:   06/09/20 1345  TempSrc:   PainSc: 0-No pain                 Molli Barrows

## 2020-07-16 ENCOUNTER — Ambulatory Visit: Admission: RE | Admit: 2020-07-16 | Payer: Medicare Other | Source: Ambulatory Visit

## 2020-07-17 ENCOUNTER — Ambulatory Visit
Admission: RE | Admit: 2020-07-17 | Discharge: 2020-07-17 | Disposition: A | Discharge status: Home / Self Care | Payer: Medicare Other | Source: Ambulatory Visit | Attending: Radiation Oncology | Admitting: Radiation Oncology

## 2020-07-17 ENCOUNTER — Other Ambulatory Visit: Payer: Self-pay

## 2020-07-17 DIAGNOSIS — C3412 Malignant neoplasm of upper lobe, left bronchus or lung: Secondary | ICD-10-CM | POA: Diagnosis present

## 2020-07-17 MED ORDER — IOHEXOL 300 MG/ML  SOLN
75.0000 mL | Freq: Once | INTRAMUSCULAR | Status: AC | PRN
  Administered 2020-07-17: 75 mL via INTRAVENOUS

## 2020-07-22 ENCOUNTER — Other Ambulatory Visit: Payer: Self-pay | Admitting: Licensed Clinical Social Worker

## 2020-07-22 ENCOUNTER — Ambulatory Visit
Admission: RE | Admit: 2020-07-22 | Discharge: 2020-07-22 | Disposition: A | Discharge status: Home / Self Care | Payer: Medicare Other | Source: Ambulatory Visit | Attending: Radiation Oncology | Admitting: Radiation Oncology

## 2020-07-22 DIAGNOSIS — C3412 Malignant neoplasm of upper lobe, left bronchus or lung: Secondary | ICD-10-CM | POA: Diagnosis not present

## 2020-07-22 DIAGNOSIS — R918 Other nonspecific abnormal finding of lung field: Secondary | ICD-10-CM | POA: Insufficient documentation

## 2020-07-22 DIAGNOSIS — Z923 Personal history of irradiation: Secondary | ICD-10-CM | POA: Insufficient documentation

## 2020-07-22 DIAGNOSIS — C771 Secondary and unspecified malignant neoplasm of intrathoracic lymph nodes: Secondary | ICD-10-CM | POA: Insufficient documentation

## 2020-07-22 NOTE — Progress Notes (Signed)
Radiation Oncology Follow up Note  Name: Steve Gregory   Date:   07/22/2020 MRN:  [id number] DOB: October 03, 1955    This 64 y.o. male presents to the clinic today for 32-month follow-up status post SBRT.  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  Clinically he is doing well he states his breathing is improved.  He specifically denies hemoptysis chest tightness.  He did have a CT scan on the 18th.  Which showed posttreatment changes in left upper lobe consistent with radiation.  There was a nodule reported on previous study which has slightly increased in size from 8 mm to 10 mm.  COMPLICATIONS OF TREATMENT: none  FOLLOW UP COMPLIANCE: keeps appointments   PHYSICAL EXAM:  BP (P) 116/73 (BP Location: Left Arm, Patient Position: Sitting)   Pulse (!) (P) 103   Temp (!) (P) 97.5 F (36.4 C) (Tympanic)   Resp (P) 18   Wt (P) 148 lb 12.8 oz (67.5 kg)   SpO2 (P) 96%   BMI (P) 21.35 kg/m  Well-developed well-nourished patient in NAD. HEENT reveals PERLA, EOMI, discs not visualized.  Oral cavity is clear. No oral mucosal lesions are identified. Neck is clear without evidence of cervical or supraclavicular adenopathy. Lungs are clear to A&P. Cardiac examination is essentially unremarkable with regular rate and rhythm without murmur rub or thrill. Abdomen is benign with no organomegaly or masses noted. Motor sensory and DTR levels are equal and symmetric in the upper and lower extremities. Cranial nerves II through XII are grossly intact. Proprioception is intact. No peripheral adenopathy or edema is identified. No motor or sensory levels are noted. Crude visual fields are within normal range.  RADIOLOGY RESULTS: Serial CT scans reviewed compatible with  above-stated findings  PLAN: Present time patient is stable chest by CT criteria to the area of previous treatment with SBRT 16 months prior.  I do not think an increase from 8 to 10 mm warrants any further imaging at this time I have asked to see him back in 6 months with a repeat CT scan of the chest at that time.  Patient is comfortable with my treatment recommendations.  I would like to take this opportunity to thank you for allowing me to participate in the care of your patient.Noreene Filbert, MD

## 2020-07-23 ENCOUNTER — Ambulatory Visit: Payer: Medicare Other | Admitting: Radiation Oncology

## 2020-07-31 ENCOUNTER — Other Ambulatory Visit: Payer: Self-pay | Admitting: Gastroenterology

## 2020-07-31 DIAGNOSIS — R1010 Upper abdominal pain, unspecified: Secondary | ICD-10-CM

## 2020-08-11 ENCOUNTER — Ambulatory Visit
Admission: RE | Admit: 2020-08-11 | Discharge: 2020-08-11 | Disposition: A | Discharge status: Home / Self Care | Payer: Medicare Other | Source: Ambulatory Visit | Attending: Gastroenterology | Admitting: Gastroenterology

## 2020-08-11 ENCOUNTER — Other Ambulatory Visit: Payer: Self-pay

## 2020-08-11 DIAGNOSIS — R1010 Upper abdominal pain, unspecified: Secondary | ICD-10-CM | POA: Insufficient documentation

## 2020-11-06 ENCOUNTER — Ambulatory Visit (INDEPENDENT_AMBULATORY_CARE_PROVIDER_SITE_OTHER): Payer: Medicare Other | Admitting: Dermatology

## 2020-11-06 ENCOUNTER — Other Ambulatory Visit: Payer: Self-pay

## 2020-11-06 ENCOUNTER — Encounter: Payer: Self-pay | Admitting: Dermatology

## 2020-11-06 DIAGNOSIS — Z85828 Personal history of other malignant neoplasm of skin: Secondary | ICD-10-CM | POA: Diagnosis not present

## 2020-11-06 DIAGNOSIS — L720 Epidermal cyst: Secondary | ICD-10-CM

## 2020-11-06 DIAGNOSIS — L814 Other melanin hyperpigmentation: Secondary | ICD-10-CM

## 2020-11-06 DIAGNOSIS — L578 Other skin changes due to chronic exposure to nonionizing radiation: Secondary | ICD-10-CM

## 2020-11-06 DIAGNOSIS — L820 Inflamed seborrheic keratosis: Secondary | ICD-10-CM

## 2020-11-06 DIAGNOSIS — D1800 Hemangioma unspecified site: Secondary | ICD-10-CM

## 2020-11-06 DIAGNOSIS — D229 Melanocytic nevi, unspecified: Secondary | ICD-10-CM

## 2020-11-06 DIAGNOSIS — L821 Other seborrheic keratosis: Secondary | ICD-10-CM

## 2020-11-06 DIAGNOSIS — Z1283 Encounter for screening for malignant neoplasm of skin: Secondary | ICD-10-CM

## 2020-11-06 DIAGNOSIS — D692 Other nonthrombocytopenic purpura: Secondary | ICD-10-CM

## 2020-11-06 NOTE — Patient Instructions (Addendum)
If you have any questions or concerns for your doctor, please call our main line at [phone] and press option 4 to reach your doctor's medical assistant. If no one answers, please leave a voicemail as directed and we will return your call as soon as possible. Messages left after 4 pm will be answered the following business day.   You may also send us a message via MyChart. We typically respond to MyChart messages within 1-2 business days.  For prescription refills, please ask your pharmacy to contact our office. Our fax number is [phone].  If you have an urgent issue when the clinic is closed that cannot wait until the next business day, you can page your doctor at the number below.    Please note that while we do our best to be available for urgent issues outside of office hours, we are not available 24/7.   If you have an urgent issue and are unable to reach us, you may choose to seek medical care at your doctor's office, retail clinic, urgent care center, or emergency room.  If you have a medical emergency, please immediately call 911 or go to the emergency department.  Pager Numbers  - Dr. Kowalski: [phone]  - Dr. Moye: [phone]  - Dr. Stewart: [phone]  In the event of inclement weather, please call our main line at [phone] for an update on the status of any delays or closures.  Dermatology Medication Tips: Please keep the boxes that topical medications come in in order to help keep track of the instructions about where and how to use these. Pharmacies typically print the medication instructions only on the boxes and not directly on the medication tubes.   If your medication is too expensive, please contact our office at [phone] option 4 or send us a message through MyChart.   We are unable to tell what your co-pay for medications will be in advance as this is different depending on your insurance coverage. However, we may be able to find a  substitute medication at lower cost or fill out paperwork to get insurance to cover a needed medication.   If a prior authorization is required to get your medication covered by your insurance company, please allow us 1-2 business days to complete this process.  Drug prices often vary depending on where the prescription is filled and some pharmacies may offer cheaper prices.  The website www.goodrx.com contains coupons for medications through different pharmacies. The prices here do not account for what the cost may be with help from insurance (it may be cheaper with your insurance), but the website can give you the price if you did not use any insurance.  - You can print the associated coupon and take it with your prescription to the pharmacy.  - You may also stop by our office during regular business hours and pick up a GoodRx coupon card.  - If you need your prescription sent electronically to a different pharmacy, notify our office through [place] MyChart or by phone at [phone] option 4.     Wound Care Instructions  1. Cleanse wound gently with soap and water once a day then pat dry with clean gauze. Apply a thing coat of Petrolatum (petroleum jelly, "Vaseline") over the wound (unless you have an allergy to this). We recommend that you use a new, sterile tube of Vaseline. Do not pick or remove scabs. Do not remove the yellow or white "healing tissue" from the base of the wound.    2. Cover the wound with fresh, clean, nonstick gauze and secure with paper tape. You may use Band-Aids in place of gauze and tape if the would is small enough, but would recommend trimming much of the tape off as there is often too much. Sometimes Band-Aids can irritate the skin.  3. You should call the office for your biopsy report after 1 week if you have not already been contacted.  4. If you experience any problems, such as abnormal amounts of bleeding, swelling, significant bruising, significant pain,  or evidence of infection, please call the office immediately.  5. FOR ADULT SURGERY PATIENTS: If you need something for pain relief you may take 1 extra strength Tylenol (acetaminophen) AND 2 Ibuprofen (200mg each) together every 4 hours as needed for pain. (do not take these if you are allergic to them or if you have a reason you should not take them.) Typically, you may only need pain medication for 1 to 3 days.     

## 2020-11-06 NOTE — Progress Notes (Signed)
Follow-Up Visit   Subjective  Steve Gregory is a 65 y.o. male who presents for the following: Annual Exam (History of SCC - TBSE today).  He has irritating spots on the arms he would like treated. The patient presents for Total-Body Skin Exam (TBSE) for skin cancer screening and mole check.  The following portions of the chart were reviewed this encounter and updated as appropriate:   Tobacco  Allergies  Meds  Problems  Med Hx  Surg Hx  Fam Hx     Review of Systems:  No other skin or systemic complaints except as noted in HPI or Assessment and Plan.  Objective  Well appearing patient in no apparent distress; mood and affect are within normal limits.  A full examination was performed including scalp, head, eyes, ears, nose, lips, neck, chest, axillae, abdomen, back, buttocks, bilateral upper extremities, bilateral lower extremities, hands, feet, fingers, toes, fingernails, and toenails. All findings within normal limits unless otherwise noted below.  Objective  Right mid back, left index finger, face: 1.2 cm Subcutaneous nodule of right mid back. 1.0 cm Subcutaneous nodule of left underside chin. Small subcutaneous nodules of nose. Subcutaneous nodule of left index finger.  Objective  Right arm x 13, left arm x 10 (23): Erythematous keratotic or waxy stuck-on papule or plaque.    Assessment & Plan  Epidermal inclusion cyst Right mid back, left index finger, face  Benign, observe.    Inflamed seborrheic keratosis (23) Right arm x 13, left arm x 10  Destruction of lesion - Right arm x 13, left arm x 10 Complexity: simple   Destruction method: cryotherapy   Informed consent: discussed and consent obtained   Timeout:  patient name, date of birth, surgical site, and procedure verified Lesion destroyed using liquid nitrogen: Yes   Region frozen until ice ball extended beyond lesion: Yes   Outcome: patient tolerated procedure well with no complications   Post-procedure  details: wound care instructions given    Skin cancer screening   Lentigines - Scattered tan macules - Due to sun exposure - Benign-appering, observe - Recommend daily broad spectrum sunscreen SPF 30+ to sun-exposed areas, reapply every 2 hours as needed. - Call for any changes  Seborrheic Keratoses - Stuck-on, waxy, tan-brown papules and plaques  - Discussed benign etiology and prognosis. - Observe - Call for any changes  Melanocytic Nevi - Tan-brown and/or pink-flesh-colored symmetric macules and papules - Benign appearing on exam today - Observation - Call clinic for new or changing moles - Recommend daily use of broad spectrum spf 30+ sunscreen to sun-exposed areas.   Hemangiomas - Red papules - Discussed benign nature - Observe - Call for any changes  Actinic Damage - Chronic, secondary to cumulative UV/sun exposure - diffuse scaly erythematous macules with underlying dyspigmentation - Recommend daily broad spectrum sunscreen SPF 30+ to sun-exposed areas, reapply every 2 hours as needed.  - Call for new or changing lesions.  Skin cancer screening performed today.  History of Squamous Cell Carcinoma of the Skin - No evidence of recurrence today - No lymphadenopathy - Recommend regular full body skin exams - Recommend daily broad spectrum sunscreen SPF 30+ to sun-exposed areas, reapply every 2 hours as needed.  - Call if any new or changing lesions are noted between office visits  Purpura - Chronic; persistent and recurrent.  Treatable, but not curable. - Violaceous macules and patches - Benign - Related to trauma, age, sun damage and/or use of blood thinners, chronic  use of topical and/or oral steroids - Observe - Can use OTC arnica containing moisturizer such as Dermend Bruise Formula if desired - Call for worsening or other concerns  Return in about 2 months (around 01/06/2021) for ISK follow up.  I, Ashok Cordia, CMA, am acting as scribe for Sarina Ser,  MD .  Documentation: I have reviewed the above documentation for accuracy and completeness, and I agree with the above.  Sarina Ser, MD

## 2020-11-19 ENCOUNTER — Encounter: Payer: Self-pay | Admitting: Dermatology

## 2021-01-01 ENCOUNTER — Ambulatory Visit (INDEPENDENT_AMBULATORY_CARE_PROVIDER_SITE_OTHER): Payer: Medicare Other | Admitting: Dermatology

## 2021-01-01 ENCOUNTER — Other Ambulatory Visit: Payer: Self-pay

## 2021-01-01 DIAGNOSIS — L820 Inflamed seborrheic keratosis: Secondary | ICD-10-CM

## 2021-01-01 DIAGNOSIS — L821 Other seborrheic keratosis: Secondary | ICD-10-CM | POA: Diagnosis not present

## 2021-01-01 DIAGNOSIS — L720 Epidermal cyst: Secondary | ICD-10-CM

## 2021-01-01 DIAGNOSIS — L578 Other skin changes due to chronic exposure to nonionizing radiation: Secondary | ICD-10-CM | POA: Diagnosis not present

## 2021-01-01 NOTE — Patient Instructions (Signed)

## 2021-01-01 NOTE — Progress Notes (Signed)
   Follow-Up Visit   Subjective  Steve Gregory is a 65 y.o. male who presents for the following: Follow-up (2 month follow up ISKs of arms treated with LN2 x 23 - has some others that nmeed to be treated on back).  The following portions of the chart were reviewed this encounter and updated as appropriate:   Tobacco  Allergies  Meds  Problems  Med Hx  Surg Hx  Fam Hx     Review of Systems:  No other skin or systemic complaints except as noted in HPI or Assessment and Plan.  Objective  Well appearing patient in no apparent distress; mood and affect are within normal limits.  A focused examination was performed including back, arms, hands. Relevant physical exam findings are noted in the Assessment and Plan.  Objective  Back x 16, arms/hands x 5 (21): Erythematous keratotic or waxy stuck-on papule or plaque.   Objective  Left index finger: Subcutaneous nodule.    Assessment & Plan    Actinic Damage - chronic, secondary to cumulative UV radiation exposure/sun exposure over time - diffuse scaly erythematous macules with underlying dyspigmentation - Recommend daily broad spectrum sunscreen SPF 30+ to sun-exposed areas, reapply every 2 hours as needed.  - Recommend staying in the shade or wearing long sleeves, sun glasses (UVA+UVB protection) and wide brim hats (4-inch brim around the entire circumference of the hat). - Call for new or changing lesions.  Seborrheic Keratoses - Stuck-on, waxy, tan-brown papules and/or plaques  - Benign-appearing - Discussed benign etiology and prognosis. - Observe - Call for any changes   Inflamed seborrheic keratosis (21) Back x 16, arms/hands x 5  Destruction of lesion - Back x 16, arms/hands x 5 Complexity: simple   Destruction method: cryotherapy   Informed consent: discussed and consent obtained   Timeout:  patient name, date of birth, surgical site, and procedure verified Lesion destroyed using liquid nitrogen: Yes   Region  frozen until ice ball extended beyond lesion: Yes   Outcome: patient tolerated procedure well with no complications   Post-procedure details: wound care instructions given    Epidermal inclusion cyst Left index finger  Will plan simple excision on follow up.  Return in about 2 months (around 03/03/2021) for ISK follow up, cyst of left index finger.  I, Ashok Cordia, CMA, am acting as scribe for Sarina Ser, MD .  Documentation: I have reviewed the above documentation for accuracy and completeness, and I agree with the above.  Sarina Ser, MD

## 2021-01-05 ENCOUNTER — Encounter: Payer: Self-pay | Admitting: Dermatology

## 2021-01-14 ENCOUNTER — Other Ambulatory Visit: Payer: Self-pay

## 2021-01-14 ENCOUNTER — Ambulatory Visit: Admission: RE | Admit: 2021-01-14 | Payer: Medicare Other | Source: Ambulatory Visit

## 2021-01-14 ENCOUNTER — Ambulatory Visit
Admission: RE | Admit: 2021-01-14 | Discharge: 2021-01-14 | Disposition: A | Discharge status: Home / Self Care | Payer: Medicare Other | Source: Ambulatory Visit | Attending: Radiation Oncology | Admitting: Radiation Oncology

## 2021-01-14 DIAGNOSIS — C3412 Malignant neoplasm of upper lobe, left bronchus or lung: Secondary | ICD-10-CM | POA: Insufficient documentation

## 2021-01-20 ENCOUNTER — Encounter (INDEPENDENT_AMBULATORY_CARE_PROVIDER_SITE_OTHER): Payer: Self-pay | Admitting: Nurse Practitioner

## 2021-01-20 ENCOUNTER — Ambulatory Visit (INDEPENDENT_AMBULATORY_CARE_PROVIDER_SITE_OTHER): Payer: Medicare Other

## 2021-01-20 ENCOUNTER — Ambulatory Visit (INDEPENDENT_AMBULATORY_CARE_PROVIDER_SITE_OTHER): Payer: Medicare Other | Admitting: Nurse Practitioner

## 2021-01-20 ENCOUNTER — Other Ambulatory Visit: Payer: Self-pay

## 2021-01-20 ENCOUNTER — Other Ambulatory Visit (INDEPENDENT_AMBULATORY_CARE_PROVIDER_SITE_OTHER): Payer: Self-pay | Admitting: Nurse Practitioner

## 2021-01-20 VITALS — BP 128/74 | HR 87 | Resp 16 | Ht 69.0 in | Wt 150.0 lb

## 2021-01-20 DIAGNOSIS — I10 Essential (primary) hypertension: Secondary | ICD-10-CM

## 2021-01-20 DIAGNOSIS — M79606 Pain in leg, unspecified: Secondary | ICD-10-CM

## 2021-01-20 DIAGNOSIS — I739 Peripheral vascular disease, unspecified: Secondary | ICD-10-CM | POA: Diagnosis not present

## 2021-01-20 DIAGNOSIS — J441 Chronic obstructive pulmonary disease with (acute) exacerbation: Secondary | ICD-10-CM

## 2021-01-20 DIAGNOSIS — F17200 Nicotine dependence, unspecified, uncomplicated: Secondary | ICD-10-CM

## 2021-01-21 ENCOUNTER — Ambulatory Visit
Admission: RE | Admit: 2021-01-21 | Discharge: 2021-01-21 | Disposition: A | Discharge status: Home / Self Care | Payer: Medicare Other | Source: Ambulatory Visit | Attending: Radiation Oncology | Admitting: Radiation Oncology

## 2021-01-21 ENCOUNTER — Encounter: Payer: Self-pay | Admitting: Radiation Oncology

## 2021-01-21 VITALS — BP 140/61 | HR 81 | Temp 97.2°F | Wt 151.6 lb

## 2021-01-21 DIAGNOSIS — Z923 Personal history of irradiation: Secondary | ICD-10-CM | POA: Insufficient documentation

## 2021-01-21 DIAGNOSIS — Z85118 Personal history of other malignant neoplasm of bronchus and lung: Secondary | ICD-10-CM | POA: Insufficient documentation

## 2021-01-21 DIAGNOSIS — C3412 Malignant neoplasm of upper lobe, left bronchus or lung: Secondary | ICD-10-CM

## 2021-01-21 NOTE — Progress Notes (Signed)
Radiation Oncology Follow up Note  Name: Steve Gregory   Date:   01/21/2021 MRN:  [id number] DOB: [birth year]    This 65 y.o. male presents to the clinic today for 34-month follow-up status post SBRT to progressive prevascular lymph node patient with non-small cell lung cancer clinically in remission.  REFERRING PROVIDER: Kirk Ruths, MD  HPI: Patient is a 65 year old male now out 2 years having completed SBRT to a progressive prevascular lymph node in a patient with known small cell lung cancer.  Seen today in routine follow-up he is doing well specifically denies hemoptysis chest tightness does have a mild cough nonproductive..  Recent CT scan showed stable posttreatment related changes involving radiation to his left upper lobe no definitive evidence to suggest residual or local recurrent disease he has a stable 9 mm pulmonary nodule in right lower lobe.  COMPLICATIONS OF TREATMENT: none  FOLLOW UP COMPLIANCE: keeps appointments   PHYSICAL EXAM:  BP 140/61   Pulse 81   Temp (!) 97.2 F (36.2 C) (Tympanic)   Wt 151 lb 9.6 oz (68.8 kg)   SpO2 98%   BMI 22.39 kg/m  Well-developed well-nourished patient in NAD. HEENT reveals PERLA, EOMI, discs not visualized.  Oral cavity is clear. No oral mucosal lesions are identified. Neck is clear without evidence of cervical or supraclavicular adenopathy. Lungs are clear to A&P. Cardiac examination is essentially unremarkable with regular rate and rhythm without murmur rub or thrill. Abdomen is benign with no organomegaly or masses noted. Motor sensory and DTR levels are equal and symmetric in the upper and lower extremities. Cranial nerves II through XII are grossly intact. Proprioception is intact. No peripheral adenopathy or edema is identified. No motor or sensory levels are noted. Crude visual fields are within normal range.  RADIOLOGY RESULTS: CT scans reviewed compatible with above-stated findings  PLAN: Present time patient is doing  well with CT showing no evidence of progressive disease.  And pleased with his overall progress.  I have asked to see him back in 1 year for follow-up.  Patient knows to call with any concerns.  I would like to take this opportunity to thank you for allowing me to participate in the care of your patient.Noreene Filbert, MD

## 2021-01-26 ENCOUNTER — Encounter (INDEPENDENT_AMBULATORY_CARE_PROVIDER_SITE_OTHER): Payer: Self-pay | Admitting: Nurse Practitioner

## 2021-01-26 NOTE — Progress Notes (Signed)
Subjective:    Patient ID: Steve Gregory, male    DOB: 1956/01/09, 65 y.o.   MRN: [id number] Chief Complaint  Patient presents with  . New Patient (Initial Visit)    ultrasound     The patient is seen for evaluation of painful lower extremities and diminished pulses. Patient notes the pain is always associated with activity and is very consistent day today. Typically, the pain occurs at less than one block, progress is as activity continues to the point that the patient must stop walking. Resting including standing still for several minutes allowed resumption of the activity and the ability to walk a similar distance before stopping again. Uneven terrain and inclined shorten the distance. The pain has been progressive over the past several years. The patient states the inability to walk is now having a profound negative impact on quality of life and daily activities.  The patient denies rest pain or dangling of an extremity off the side of the bed during the night for relief. No open wounds or sores at this time. No prior interventions or surgeries.  There is a history of back problems or DJD of the lumbar sacral spine.   The patient denies changes in claudication symptoms or new rest pain symptoms.  No new ulcers or wounds of the foot.  The patient's blood pressure has been stable and relatively well controlled. The patient denies amaurosis fugax or recent TIA symptoms. There are no recent neurological changes noted. The patient denies history of DVT, PE or superficial thrombophlebitis. The patient denies recent episodes of angina or shortness of breath.   The patient has an ABI 0.44 on the right and 0.43 on the left.  He has monophasic tibial artery waveforms bilaterally with dampened toe waveforms bilaterally.   Review of Systems  Cardiovascular:       Claudication  All other systems reviewed and are negative.      Objective:   Physical Exam Vitals reviewed.  HENT:      Head: Normocephalic.  Cardiovascular:     Rate and Rhythm: Normal rate.     Pulses: Normal pulses.  Pulmonary:     Effort: Pulmonary effort is normal.  Neurological:     Mental Status: He is alert and oriented to person, place, and time.  Psychiatric:        Mood and Affect: Mood normal.        Behavior: Behavior normal.        Thought Content: Thought content normal.        Judgment: Judgment normal.     BP 128/74 (BP Location: Right Arm)   Pulse 87   Resp 16   Ht 5\' 9"  (1.753 m)   Wt 150 lb (68 kg)   BMI 22.15 kg/m   Past Medical History:  Diagnosis Date  . Allergy   . Anxiety   . Arthritis   . Barrett's esophagus   . Bronchitis, chronic ([place])   . Cancer (HCC)    LUNG  . COPD (chronic obstructive pulmonary disease) ([place])   . Cough    CHRONIC  . Depression   . Dizziness   . Dysphagia   . Essential hypertension 06/28/2017  . GERD (gastroesophageal reflux disease)   . H/O emphysema   . Headache   . History of depression 06/05/2014   Last Assessment & Plan:  Mood is doing well on medications.   . History of hiatal hernia   . HOH (hard of hearing)   .  Hypertension   . Lung mass   . Maxillary fracture (HCC)    LEFT SIDE  . Orthopnea   . Oxygen decrease    H/O HOME USE, NOT NOW  . Personal history of colonic polyps   . Pneumonia    PSEUDOMONAL PNEUMONIA IN THE PAST  . PVD (peripheral vascular disease) ([place]) 06/05/2014   Overview:  Smoker with poor pulses  Last Assessment & Plan:  No leg pain is noted of late.   . Renal failure    PRERENAL  . Seizures (HCC)    sounds like it could be vagal at times, coughing can cause him to fade out and see white lights. had last sz 3 months ago when not taking meds  . Shortness of breath dyspnea    uses many different inhalers  . Squamous cell carcinoma of skin 01/07/2020   right mid volar forearm, mid back spinal  . Varicose veins of both lower extremities with pain 06/28/2017  . Wheezing     Social History    Socioeconomic History  . Marital status: Single    Spouse name: Not on file  . Number of children: Not on file  . Years of education: Not on file  . Highest education level: Not on file  Occupational History  . Not on file  Tobacco Use  . Smoking status: Current Every Day Smoker    Packs/day: 1.00    Years: 43.00    Pack years: 43.00    Types: Cigarettes  . Smokeless tobacco: Never Used  Vaping Use  . Vaping Use: Never used  Substance and Sexual Activity  . Alcohol use: No  . Drug use: No  . Sexual activity: Not on file  Other Topics Concern  . Not on file  Social History Narrative  . Not on file   Social Determinants of Health   Financial Resource Strain: Not on file  Food Insecurity: Not on file  Transportation Needs: Not on file  Physical Activity: Not on file  Stress: Not on file  Social Connections: Not on file  Intimate Partner Violence: Not on file    Past Surgical History:  Procedure Laterality Date  . CATARACT EXTRACTION W/PHACO Right 10/27/2015   Procedure: CATARACT EXTRACTION PHACO AND INTRAOCULAR LENS PLACEMENT ([place]) suture placed in right eye at end of procedure;  Surgeon: Estill Cotta, MD;  Location: [facility] ORS;  Service: Ophthalmology;  Laterality: Right;  [place] 01:02 AP% 24.4 CDE 28.12 fluid pack lot # [id number]  . CATARACT EXTRACTION W/PHACO Left 11/17/2015   Procedure: CATARACT EXTRACTION PHACO AND INTRAOCULAR LENS PLACEMENT ([place]);  Surgeon: Estill Cotta, MD;  Location: [facility] ORS;  Service: Ophthalmology;  Laterality: Left;  [place] 01:29 AP% 24.3 CDE 40.05 fluid pack lot # [id number]  . COLONOSCOPY WITH PROPOFOL N/A 07/28/2017   Procedure: COLONOSCOPY WITH PROPOFOL;  Surgeon: [name], MD;  Location: [facility] ENDOSCOPY;  Service: Endoscopy;  Laterality: N/A;  . COLONOSCOPY WITH PROPOFOL N/A 06/09/2020   Procedure: COLONOSCOPY WITH PROPOFOL;  Surgeon: Lesly Rubenstein, MD;  Location: [facility] ENDOSCOPY;  Service: Endoscopy;  Laterality: N/A;   . ESOPHAGOGASTRODUODENOSCOPY (EGD) WITH PROPOFOL N/A 06/06/2017   Procedure: ESOPHAGOGASTRODUODENOSCOPY (EGD) WITH PROPOFOL;  Surgeon: [name], MD;  Location: [facility] ENDOSCOPY;  Service: Endoscopy;  Laterality: N/A;  . ESOPHAGOGASTRODUODENOSCOPY (EGD) WITH PROPOFOL N/A 06/09/2020   Procedure: ESOPHAGOGASTRODUODENOSCOPY (EGD) WITH PROPOFOL;  Surgeon: Lesly Rubenstein, MD;  Location: [facility] ENDOSCOPY;  Service: Endoscopy;  Laterality: N/A;  . EYE SURGERY    .  FACIAL RECONSTRUCTION SURGERY  1980   LEFT CHEEK BONE INJURY 1980    Family History  Problem Relation Age of Onset  . Hyperlipidemia Mother   . Stroke Father   . Aneurysm Father   . Heart disease Brother   . Hypertension Brother     Allergies  Allergen Reactions  . Chantix [Varenicline] Swelling       . Lipitor [Atorvastatin] Swelling  . Mobic [Meloxicam] Other (See Comments)    Myalgia  . Statins Other (See Comments)    Myalgia    CBC Latest Ref Rng & Units 11/13/2019 11/12/2019 09/21/2018  WBC 4.0 - 10.5 K/uL 13.1(H) 13.5(H) 11.0(H)  Hemoglobin 13.0 - 17.0 g/dL 12.6(L) 14.1 13.8  Hematocrit 39.0 - 52.0 % 39.6 43.5 42.8  Platelets 150 - 400 K/uL 249 281 331      CMP     Component Value Date/Time   NA 140 11/13/2019 0520   NA 136 02/10/2014 0507   K 4.6 11/13/2019 0520   K 4.6 02/10/2014 0507   CL 108 11/13/2019 0520   CL 98 02/10/2014 0507   CO2 25 11/13/2019 0520   CO2 31 02/10/2014 0507   GLUCOSE 187 (H) 11/13/2019 0520   GLUCOSE 181 (H) 02/10/2014 0507   BUN 15 11/13/2019 0520   BUN 21 (H) 02/10/2014 0507   CREATININE 1.06 11/13/2019 0520   CREATININE 0.61 02/10/2014 0507   CALCIUM 8.8 (L) 11/13/2019 0520   CALCIUM 8.2 (L) 02/10/2014 0507   PROT 6.9 11/12/2019 1454   PROT 5.5 (L) 02/03/2014 0614   ALBUMIN 3.9 11/12/2019 1454   ALBUMIN 1.9 (L) 02/03/2014 0614   AST 23 11/12/2019 1454   AST 15 02/03/2014 0614   ALT 12 11/12/2019 1454   ALT 16 02/03/2014 0614   ALKPHOS 77 11/12/2019 1454    ALKPHOS 49 02/03/2014 0614   BILITOT 0.7 11/12/2019 1454   BILITOT 0.3 02/03/2014 0614   GFRNONAA >60 11/13/2019 0520   GFRNONAA >60 02/10/2014 0507   GFRAA >60 11/13/2019 0520   GFRAA >60 02/10/2014 0507     No results found.     Assessment & Plan:   1. PVD (peripheral vascular disease) ([place]) I had a long discussion with the patient in regards to PAD and how atherosclerotic changes related to his smoking.  Angiogram was recommended due to his severe PAD as well as his symptoms.  However the patient does not wish to proceed with intervention at this time due to currently undergoing radiation for lung cancer.  The patient was advised that his symptoms will likely continue to worsen and it could possibly become a limb threatening situation.  Unfortunately we are unable to gauge at what point this will happen.  This could happen in a matter of days, weeks or even years.  Emergent symptoms were discussed with the patient including: Cold limb, severe consistent pain, loss of motor function, new numbness, pallor of the foot, or new wounds or discoloration of the foot.  27 of these things occur he should contact our office immediately for further follow-up and evaluation.  Otherwise we will have the patient follow-up in 3 months with noninvasive studies.  2. HTN (hypertension), benign Continue antihypertensive medications as already ordered, these medications have been reviewed and there are no changes at this time.   3. Tobacco dependence Smoking cessation was discussed, 3-10 minutes spent on this topic specifically   4. COPD exacerbation ([place]) Continue pulmonary medications and aerosols as already ordered, these medications have been  reviewed and there are no changes at this time.     Current Outpatient Medications on File Prior to Visit  Medication Sig Dispense Refill  . albuterol (PROVENTIL) (2.5 MG/3ML) 0.083% nebulizer solution Take 2.5 mg by nebulization every 6 (six) hours as  needed for wheezing or shortness of breath.    . fluticasone (FLONASE) 50 MCG/ACT nasal spray Place 2 sprays into both nostrils daily.    . fluticasone-salmeterol (ADVAIR HFA) 115-21 MCG/ACT inhaler Inhale 2 puffs into the lungs 2 (two) times daily.    Marland Kitchen gabapentin (NEURONTIN) 100 MG capsule Take 1 capsule by mouth 3 (three) times daily.    Marland Kitchen losartan (COZAAR) 50 MG tablet Take 50 mg by mouth daily.     . montelukast (SINGULAIR) 10 MG tablet Take 10 mg by mouth daily.     . pantoprazole (PROTONIX) 40 MG tablet Take 40 mg by mouth 2 (two) times daily before a meal.    . predniSONE (DELTASONE) 10 MG tablet Take 50 mg daily--taper by 10 mg daily thereafter resume your chronic daily prednisone dose 15 tablet 0  . sucralfate (CARAFATE) 1 g tablet Take 1 g by mouth 2 (two) times daily. (Patient not taking: Reported on 01/21/2021)    . theophylline (UNIPHYL) 400 MG 24 hr tablet Take 400 mg by mouth daily.    . VENTOLIN HFA 108 (90 Base) MCG/ACT inhaler Inhale 2 puffs into the lungs every 6 (six) hours as needed for wheezing or shortness of breath.     . DULoxetine (CYMBALTA) 60 MG capsule Take 60 mg by mouth daily. (Patient not taking: No sig reported)    . famotidine (PEPCID) 20 MG tablet Take 20 mg by mouth 2 (two) times daily. (Patient not taking: No sig reported)    . SYMBICORT 160-4.5 MCG/ACT inhaler Inhale into the lungs. (Patient not taking: No sig reported)     No current facility-administered medications on file prior to visit.    There are no Patient Instructions on file for this visit. No follow-ups on file.   Kris Hartmann, NP

## 2021-03-26 ENCOUNTER — Other Ambulatory Visit: Payer: Self-pay

## 2021-03-26 ENCOUNTER — Ambulatory Visit (INDEPENDENT_AMBULATORY_CARE_PROVIDER_SITE_OTHER): Payer: Medicare Other | Admitting: Dermatology

## 2021-03-26 DIAGNOSIS — L578 Other skin changes due to chronic exposure to nonionizing radiation: Secondary | ICD-10-CM

## 2021-03-26 DIAGNOSIS — L720 Epidermal cyst: Secondary | ICD-10-CM

## 2021-03-26 DIAGNOSIS — D492 Neoplasm of unspecified behavior of bone, soft tissue, and skin: Secondary | ICD-10-CM

## 2021-03-26 DIAGNOSIS — L820 Inflamed seborrheic keratosis: Secondary | ICD-10-CM | POA: Diagnosis not present

## 2021-03-26 NOTE — Patient Instructions (Addendum)
Cryotherapy Aftercare  Wash gently with soap and water everyday.   Apply Vaseline and Band-Aid daily until healed.    Wound Care Instructions  Cleanse wound gently with soap and water once a day then pat dry with clean gauze. Apply a thing coat of Petrolatum (petroleum jelly, "Vaseline") over the wound (unless you have an allergy to this). We recommend that you use a new, sterile tube of Vaseline. Do not pick or remove scabs. Do not remove the yellow or white "healing tissue" from the base of the wound.  Cover the wound with fresh, clean, nonstick gauze and secure with paper tape. You may use Band-Aids in place of gauze and tape if the would is small enough, but would recommend trimming much of the tape off as there is often too much. Sometimes Band-Aids can irritate the skin.  You should call the office for your biopsy report after 1 week if you have not already been contacted.  If you experience any problems, such as abnormal amounts of bleeding, swelling, significant bruising, significant pain, or evidence of infection, please call the office immediately.  FOR ADULT SURGERY PATIENTS: If you need something for pain relief you may take 1 extra strength Tylenol (acetaminophen) AND 2 Ibuprofen (200mg each) together every 4 hours as needed for pain. (do not take these if you are allergic to them or if you have a reason you should not take them.) Typically, you may only need pain medication for 1 to 3 days.     If you have any questions or concerns for your doctor, please call our main line at [phone] and press option 4 to reach your doctor's medical assistant. If no one answers, please leave a voicemail as directed and we will return your call as soon as possible. Messages left after 4 pm will be answered the following business day.   You may also send us a message via MyChart. We typically respond to MyChart messages within 1-2 business days.  For prescription refills, please ask your  pharmacy to contact our office. Our fax number is [phone].  If you have an urgent issue when the clinic is closed that cannot wait until the next business day, you can page your doctor at the number below.    Please note that while we do our best to be available for urgent issues outside of office hours, we are not available 24/7.   If you have an urgent issue and are unable to reach us, you may choose to seek medical care at your doctor's office, retail clinic, urgent care center, or emergency room.  If you have a medical emergency, please immediately call 911 or go to the emergency department.  Pager Numbers  - Dr. Kowalski: [phone]  - Dr. Moye: [phone]  - Dr. Stewart: [phone]  In the event of inclement weather, please call our main line at [phone] for an update on the status of any delays or closures.  Dermatology Medication Tips: Please keep the boxes that topical medications come in in order to help keep track of the instructions about where and how to use these. Pharmacies typically print the medication instructions only on the boxes and not directly on the medication tubes.   If your medication is too expensive, please contact our office at [phone] option 4 or send us a message through MyChart.   We are unable to tell what your co-pay for medications will be in advance as this is different depending on your insurance coverage.   However, we may be able to find a substitute medication at lower cost or fill out paperwork to get insurance to cover a needed medication.   If a prior authorization is required to get your medication covered by your insurance company, please allow us 1-2 business days to complete this process.  Drug prices often vary depending on where the prescription is filled and some pharmacies may offer cheaper prices.  The website www.goodrx.com contains coupons for medications through different pharmacies. The prices here do not  account for what the cost may be with help from insurance (it may be cheaper with your insurance), but the website can give you the price if you did not use any insurance.  - You can print the associated coupon and take it with your prescription to the pharmacy.  - You may also stop by our office during regular business hours and pick up a GoodRx coupon card.  - If you need your prescription sent electronically to a different pharmacy, notify our office through [facility] MyChart or by phone at [phone] option 4.  

## 2021-03-26 NOTE — Progress Notes (Signed)
   Follow-Up Visit   Subjective  Steve Gregory is a 65 y.o. male who presents for the following: Skin Problem (Pt here to have a growth on his left index finger removed.). Check spots on the arms.   The following portions of the chart were reviewed this encounter and updated as appropriate:   Tobacco  Allergies  Meds  Problems  Med Hx  Surg Hx  Fam Hx     Review of Systems:  No other skin or systemic complaints except as noted in HPI or Assessment and Plan.  Objective  Well appearing patient in no apparent distress; mood and affect are within normal limits.  A focused examination was performed including face,arms,hands,fingers. Relevant physical exam findings are noted in the Assessment and Plan.  back x 9, R arm x 7, Total = 16 (16) Erythematous keratotic or waxy stuck-on papule or plaque.   L index finger Subcutaneous nodule 1.1cm   Assessment & Plan  Inflamed seborrheic keratosis back x 9, R arm x 7, Total = 16  On R arm with associated LSC  Destruction of lesion - back x 9, R arm x 7, Total = 16 Complexity: simple   Destruction method: cryotherapy   Informed consent: discussed and consent obtained   Timeout:  patient name, date of birth, surgical site, and procedure verified Lesion destroyed using liquid nitrogen: Yes   Region frozen until ice ball extended beyond lesion: Yes   Outcome: patient tolerated procedure well with no complications   Post-procedure details: wound care instructions given    Neoplasm of skin L index finger  Skin excision  Lesion length (cm):  1.1 Lesion width (cm):  1.1 Margin per side (cm):  0 Total excision diameter (cm):  1.1 Informed consent: discussed and consent obtained   Timeout: patient name, date of birth, surgical site, and procedure verified   Procedure prep:  Patient was prepped and draped in usual sterile fashion Prep type:  Isopropyl alcohol and povidone-iodine Anesthesia: the lesion was anesthetized in a standard  fashion   Anesthetic:  1% lidocaine w/ epinephrine 1-100,000 buffered w/ 8.4% NaHCO3 Instrument used: scissors   Hemostasis achieved with: pressure   Hemostasis achieved with comment:  Electrocautery Outcome: patient tolerated procedure well with no complications   Post-procedure details: sterile dressing applied and wound care instructions given   Dressing type: bandage and bacitracin (Mupirocin)    Specimen 1 - Surgical pathology Differential Diagnosis: D48.5 Cyst vs other Check Margins: No  Subcutaneous nodule 1.1cm  Cyst vs other, simple excision today.  Actinic Damage - chronic, secondary to cumulative UV radiation exposure/sun exposure over time - diffuse scaly erythematous macules with underlying dyspigmentation - Recommend daily broad spectrum sunscreen SPF 30+ to sun-exposed areas, reapply every 2 hours as needed.  - Recommend staying in the shade or wearing long sleeves, sun glasses (UVA+UVB protection) and wide brim hats (4-inch brim around the entire circumference of the hat). - Call for new or changing lesions.  Return in about 3 months (around 06/26/2021) for 3-47m recheck ISKs with Why R arm.  I, Marye Round, CMA, am acting as scribe for Sarina Ser, MD .   I, Othelia Pulling, RMA, am acting as scribe for Sarina Ser, MD . Documentation: I have reviewed the above documentation for accuracy and completeness, and I agree with the above.  Sarina Ser, MD

## 2021-03-31 ENCOUNTER — Encounter: Payer: Self-pay | Admitting: Dermatology

## 2021-04-02 ENCOUNTER — Telehealth: Payer: Self-pay

## 2021-04-02 NOTE — Telephone Encounter (Signed)
Tried to call patient regarding results - no answer/hd

## 2021-04-02 NOTE — Telephone Encounter (Signed)
-----   Message from Ralene Bathe, MD sent at 04/02/2021  2:31 PM EDT ----- Diagnosis Skin , left index finger EPIDERMOID CYST  Benign cyst

## 2021-04-08 ENCOUNTER — Telehealth: Payer: Self-pay

## 2021-04-08 NOTE — Telephone Encounter (Signed)
-----   Message from Ralene Bathe, MD sent at 04/02/2021  2:31 PM EDT ----- Diagnosis Skin , left index finger EPIDERMOID CYST  Benign cyst

## 2021-04-08 NOTE — Telephone Encounter (Signed)
Advised patient of results/hd  

## 2021-04-28 ENCOUNTER — Encounter (INDEPENDENT_AMBULATORY_CARE_PROVIDER_SITE_OTHER): Payer: Medicare Other

## 2021-04-28 ENCOUNTER — Ambulatory Visit (INDEPENDENT_AMBULATORY_CARE_PROVIDER_SITE_OTHER): Payer: Medicare Other | Admitting: Vascular Surgery

## 2021-06-22 ENCOUNTER — Other Ambulatory Visit: Payer: Self-pay | Admitting: *Deleted

## 2021-06-22 DIAGNOSIS — C3412 Malignant neoplasm of upper lobe, left bronchus or lung: Secondary | ICD-10-CM

## 2021-07-20 ENCOUNTER — Other Ambulatory Visit: Payer: Self-pay

## 2021-07-20 ENCOUNTER — Ambulatory Visit
Admission: RE | Admit: 2021-07-20 | Discharge: 2021-07-20 | Disposition: A | Discharge status: Home / Self Care | Payer: Medicare Other | Source: Ambulatory Visit | Attending: Radiation Oncology | Admitting: Radiation Oncology

## 2021-07-20 DIAGNOSIS — C3412 Malignant neoplasm of upper lobe, left bronchus or lung: Secondary | ICD-10-CM | POA: Diagnosis present

## 2021-07-20 MED ORDER — IOHEXOL 300 MG/ML  SOLN
75.0000 mL | Freq: Once | INTRAMUSCULAR | Status: AC | PRN
  Administered 2021-07-20: 75 mL via INTRAVENOUS

## 2021-07-21 ENCOUNTER — Encounter: Payer: Self-pay | Admitting: Internal Medicine

## 2021-07-27 ENCOUNTER — Encounter: Payer: Self-pay | Admitting: Radiation Oncology

## 2021-07-27 ENCOUNTER — Other Ambulatory Visit: Payer: Self-pay

## 2021-07-27 ENCOUNTER — Other Ambulatory Visit: Payer: Self-pay | Admitting: *Deleted

## 2021-07-27 ENCOUNTER — Ambulatory Visit
Admission: RE | Admit: 2021-07-27 | Discharge: 2021-07-27 | Disposition: A | Discharge status: Home / Self Care | Payer: Medicare Other | Source: Ambulatory Visit | Attending: Radiation Oncology | Admitting: Radiation Oncology

## 2021-07-27 VITALS — BP 141/86 | HR 96 | Temp 97.4°F | Wt 154.0 lb

## 2021-07-27 DIAGNOSIS — Z85118 Personal history of other malignant neoplasm of bronchus and lung: Secondary | ICD-10-CM | POA: Insufficient documentation

## 2021-07-27 DIAGNOSIS — C3412 Malignant neoplasm of upper lobe, left bronchus or lung: Secondary | ICD-10-CM

## 2021-07-27 DIAGNOSIS — Z923 Personal history of irradiation: Secondary | ICD-10-CM | POA: Diagnosis not present

## 2021-07-27 DIAGNOSIS — F17210 Nicotine dependence, cigarettes, uncomplicated: Secondary | ICD-10-CM | POA: Insufficient documentation

## 2021-07-28 NOTE — Progress Notes (Signed)
Radiation Oncology Follow up Note  Name: Steve Gregory   Date:   07/27/2021 MRN:  [id number] DOB: Mar 30, 1956    This 65 y.o. male presents to the clinic today for 2-1/2-year follow-up status post SBRT to progressive prevascular space lymph node with patient with known history of non-small cell lung cancer in clinical remission.  REFERRING PROVIDER: Kirk Ruths, MD  HPI: Patient is a 65 year old male now out 2-1/2-year having completed SBRT to progressive.  Prevascular lymph node in patient with non-small cell lung cancer in clinical remission.  Seen today in routine follow-up he is doing well specifically [place] cough hemoptysis or chest tightness.  Recent CT scan shows stable examination and showing postradiation changes in the lungs bilaterally no findings suggest local recurrent disease or definitive missed metastatic disease a previous noted right lower lobe pulmonary nodule slightly decreased in size  COMPLICATIONS OF TREATMENT: none  FOLLOW UP COMPLIANCE: keeps appointments   PHYSICAL EXAM:  BP (!) 141/86   Pulse 96   Temp (!) 97.4 F (36.3 C) (Tympanic)   Wt 154 lb (69.9 kg)   SpO2 97%   BMI 22.74 kg/m  Well-developed well-nourished patient in NAD. HEENT reveals PERLA, EOMI, discs not visualized.  Oral cavity is clear. No oral mucosal lesions are identified. Neck is clear without evidence of cervical or supraclavicular adenopathy. Lungs are clear to A&P. Cardiac examination is essentially unremarkable with regular rate and rhythm without murmur rub or thrill. Abdomen is benign with no organomegaly or masses noted. Motor sensory and DTR levels are equal and symmetric in the upper and lower extremities. Cranial nerves II through XII are grossly intact. Proprioception is intact. No peripheral adenopathy or edema is identified. No motor or sensory levels are noted. Crude visual fields are within normal range.  RADIOLOGY RESULTS: CT scan reviewed compatible with above-stated  findings  PLAN: Present time patient is doing well no evidence of disease now about 2-1/2 years.  I will see him back in 1 year for follow-up with a repeat CT scan at that time.  Patient knows to call anytime with any concerns.  I would like to take this opportunity to thank you for allowing me to participate in the care of your patient.Noreene Filbert, MD

## 2021-09-09 ENCOUNTER — Other Ambulatory Visit: Payer: Self-pay

## 2021-09-09 ENCOUNTER — Ambulatory Visit (INDEPENDENT_AMBULATORY_CARE_PROVIDER_SITE_OTHER): Payer: Medicare Other | Admitting: Dermatology

## 2021-09-09 DIAGNOSIS — D2239 Melanocytic nevi of other parts of face: Secondary | ICD-10-CM

## 2021-09-09 DIAGNOSIS — Z85828 Personal history of other malignant neoplasm of skin: Secondary | ICD-10-CM

## 2021-09-09 DIAGNOSIS — L578 Other skin changes due to chronic exposure to nonionizing radiation: Secondary | ICD-10-CM

## 2021-09-09 DIAGNOSIS — L820 Inflamed seborrheic keratosis: Secondary | ICD-10-CM | POA: Diagnosis not present

## 2021-09-09 DIAGNOSIS — D492 Neoplasm of unspecified behavior of bone, soft tissue, and skin: Secondary | ICD-10-CM

## 2021-09-09 DIAGNOSIS — D229 Melanocytic nevi, unspecified: Secondary | ICD-10-CM

## 2021-09-09 DIAGNOSIS — D485 Neoplasm of uncertain behavior of skin: Secondary | ICD-10-CM | POA: Diagnosis not present

## 2021-09-09 NOTE — Progress Notes (Signed)
Follow-Up Visit   Subjective  Steve Gregory is a 66 y.o. male who presents for the following: ISK (Arms, txted at last visit with LN2, pt says some have come back, R arm with associated LSC), check spots (Back, itchy, 31m), and check spot (Underside chin, bx 10/31/2013 by Dr. Tyler Deis and was Irritated Nevus). The patient has spots, moles and lesions to be evaluated, some may be new or changing and the patient has concerns that these could be cancer.  The following portions of the chart were reviewed this encounter and updated as appropriate:   Tobacco   Allergies   Meds   Problems   Med Hx   Surg Hx   Fam Hx      Review of Systems:  No other skin or systemic complaints except as noted in HPI or Assessment and Plan.  Objective  Well appearing patient in no apparent distress; mood and affect are within normal limits.  A focused examination was performed including arms, back. Relevant physical exam findings are noted in the Assessment and Plan.  R distal forearm Hyperkeratotic pap 2.1cm     L proximal forearm Hyperkeratotic pap 1.2cm     bil arms x 22 (22) Stuck on waxy paps with erythema   underside chin Flesh colored pap   Assessment & Plan   Actinic Damage - chronic, secondary to cumulative UV radiation exposure/sun exposure over time - diffuse scaly erythematous macules with underlying dyspigmentation - Recommend daily broad spectrum sunscreen SPF 30+ to sun-exposed areas, reapply every 2 hours as needed.  - Recommend staying in the shade or wearing long sleeves, sun glasses (UVA+UVB protection) and wide brim hats (4-inch brim around the entire circumference of the hat). - Call for new or changing lesions.   History of Squamous Cell Carcinoma of the Skin - No evidence of recurrence today - No lymphadenopathy - Recommend regular full body skin exams - Recommend daily broad spectrum sunscreen SPF 30+ to sun-exposed areas, reapply every 2 hours as needed.  - Call  if any new or changing lesions are noted between office visits - R mid volar forearm, mid back spinal   Neoplasm of skin (2) R distal forearm  Epidermal / dermal shaving  Lesion diameter (cm):  2.1 Informed consent: discussed and consent obtained   Timeout: patient name, date of birth, surgical site, and procedure verified   Procedure prep:  Patient was prepped and draped in usual sterile fashion Prep type:  Isopropyl alcohol Anesthesia: the lesion was anesthetized in a standard fashion   Anesthetic:  1% lidocaine w/ epinephrine 1-100,000 buffered w/ 8.4% NaHCO3 Instrument used: flexible razor blade   Hemostasis achieved with: pressure, aluminum chloride and electrodesiccation   Outcome: patient tolerated procedure well   Post-procedure details: sterile dressing applied and wound care instructions given   Dressing type: bacitracin and pressure dressing    Destruction of lesion Complexity: extensive   Destruction method: electrodesiccation and curettage   Informed consent: discussed and consent obtained   Timeout:  patient name, date of birth, surgical site, and procedure verified Procedure prep:  Patient was prepped and draped in usual sterile fashion Prep type:  Isopropyl alcohol Anesthesia: the lesion was anesthetized in a standard fashion   Anesthetic:  1% lidocaine w/ epinephrine 1-100,000 buffered w/ 8.4% NaHCO3 Curettage performed in three different directions: Yes   Electrodesiccation performed over the curetted area: Yes   Lesion length (cm):  2.1 Lesion width (cm):  2.1 Margin per side (cm):  0.2 Final wound size (cm):  2.5 Hemostasis achieved with:  pressure, aluminum chloride and electrodesiccation Outcome: patient tolerated procedure well with no complications   Post-procedure details: sterile dressing applied and wound care instructions given   Dressing type: bacitracin and pressure dressing    Specimen 1 - Surgical pathology Differential Diagnosis: D48.5 ISK  r/o SCC  Check Margins: yes Hyperkeratotic pap 2.1cm EDC  L proximal forearm  Epidermal / dermal shaving  Lesion diameter (cm):  1.2 Informed consent: discussed and consent obtained   Timeout: patient name, date of birth, surgical site, and procedure verified   Procedure prep:  Patient was prepped and draped in usual sterile fashion Prep type:  Isopropyl alcohol Anesthesia: the lesion was anesthetized in a standard fashion   Anesthetic:  1% lidocaine w/ epinephrine 1-100,000 buffered w/ 8.4% NaHCO3 Instrument used: flexible razor blade   Hemostasis achieved with: pressure, aluminum chloride and electrodesiccation   Outcome: patient tolerated procedure well   Post-procedure details: sterile dressing applied and wound care instructions given   Dressing type: bacitracin and pressure dressing    Destruction of lesion Complexity: extensive   Destruction method: electrodesiccation and curettage   Informed consent: discussed and consent obtained   Timeout:  patient name, date of birth, surgical site, and procedure verified Procedure prep:  Patient was prepped and draped in usual sterile fashion Prep type:  Isopropyl alcohol Anesthesia: the lesion was anesthetized in a standard fashion   Anesthetic:  1% lidocaine w/ epinephrine 1-100,000 buffered w/ 8.4% NaHCO3 Curettage performed in three different directions: Yes   Electrodesiccation performed over the curetted area: Yes   Lesion length (cm):  1.2 Lesion width (cm):  1.2 Margin per side (cm):  0.2 Final wound size (cm):  1.6 Hemostasis achieved with:  pressure, aluminum chloride and electrodesiccation Outcome: patient tolerated procedure well with no complications   Post-procedure details: sterile dressing applied and wound care instructions given   Dressing type: bacitracin and pressure dressing    Specimen 2 - Surgical pathology Differential Diagnosis: D48.5 ISK r/o SCC  Check Margins: yes Hyperkeratotic pap  1.2cm EDC  Inflamed seborrheic keratosis (22) bil arms x 22  Destruction of lesion - bil arms x 22 Complexity: simple   Destruction method: cryotherapy   Informed consent: discussed and consent obtained   Timeout:  patient name, date of birth, surgical site, and procedure verified Lesion destroyed using liquid nitrogen: Yes   Region frozen until ice ball extended beyond lesion: Yes   Outcome: patient tolerated procedure well with no complications   Post-procedure details: wound care instructions given    Nevus underside chin  Bx proven, 2015,  Irritated nevus, perisitent Discussed bx and shave removal on f/u   Return in about 3 months (around 12/08/2021) for ISKs back, bx Irritated Nevus underside chin.  I, Othelia Pulling, RMA, am acting as scribe for Sarina Ser, MD . Documentation: I have reviewed the above documentation for accuracy and completeness, and I agree with the above.  Sarina Ser, MD

## 2021-09-09 NOTE — Patient Instructions (Addendum)
If You Need Anything After Your Visit  If you have any questions or concerns for your doctor, please call our main line at [phone] and press option 4 to reach your doctor's medical assistant. If no one answers, please leave a voicemail as directed and we will return your call as soon as possible. Messages left after 4 pm will be answered the following business day.   You may also send [place] a message via [place]. We typically respond to MyChart messages within 1-2 business days.  For prescription refills, please ask your pharmacy to contact our office. Our fax number is [phone].  If you have an urgent issue when the clinic is closed that cannot wait until the next business day, you can page your doctor at the number below.    Please note that while we do our best to be available for urgent issues outside of office hours, we are not available 24/7.   If you have an urgent issue and are unable to reach [place], you may choose to seek medical care at your doctor's office, retail clinic, urgent care center, or emergency room.  If you have a medical emergency, please immediately call 911 or go to the emergency department.  Pager Numbers  - Dr. Nehemiah Massed: [phone]  - Dr. Laurence Ferrari: [phone]  - Dr. Nicole Kindred: [phone]  In the event of inclement weather, please call our main line at [phone] for an update on the status of any delays or closures.  Dermatology Medication Tips: Please keep the boxes that topical medications come in in order to help keep track of the instructions about where and how to use these. Pharmacies typically print the medication instructions only on the boxes and not directly on the medication tubes.   If your medication is too expensive, please contact our office at [phone] option 4 or send [place] a message through [place].   We are unable to tell what your co-pay for medications will be in advance as this is different depending on your insurance coverage.  However, we may be able to find a substitute medication at lower cost or fill out paperwork to get insurance to cover a needed medication.   If a prior authorization is required to get your medication covered by your insurance company, please allow [place] 1-2 business days to complete this process.  Drug prices often vary depending on where the prescription is filled and some pharmacies may offer cheaper prices.  The website www.goodrx.com contains coupons for medications through different pharmacies. The prices here do not account for what the cost may be with help from insurance (it may be cheaper with your insurance), but the website can give you the price if you did not use any insurance.  - You can print the associated coupon and take it with your prescription to the pharmacy.  - You may also stop by our office during regular business hours and pick up a GoodRx coupon card.  - If you need your prescription sent electronically to a different pharmacy, notify our office through [facility] or by phone at [phone] option 4.     Si Usted Necesita Algo Despus de Su Visita  Tambin puede enviarnos un mensaje a travs de Pharmacist, community. Por lo general respondemos a los mensajes de MyChart en el transcurso de 1 a 2 das hbiles.  Para renovar recetas, por favor pida a su farmacia que se ponga en contacto con nuestra oficina. Harland Dingwall de fax es [place] [phone].  Si tiene  un asunto urgente cuando la clnica est cerrada y que no puede esperar hasta el siguiente da hbil, puede llamar/localizar a su doctor(a) al nmero que aparece a continuacin.   Por favor, tenga en cuenta que aunque hacemos todo lo posible para estar disponibles para asuntos urgentes fuera del horario de [place], no estamos disponibles las 24 horas del da, los 7 das de la [place].   Si tiene un problema urgente y no puede comunicarse con nosotros, puede optar por buscar atencin mdica  en el consultorio de su  doctor(a), en una clnica privada, en un centro de atencin urgente o en una sala de emergencias.  Si tiene Engineering geologist, por favor llame inmediatamente al 911 o vaya a la sala de emergencias.  Nmeros de bper  - Dr. Nehemiah Massed: [phone]  - Dra. Moye: [phone]  - Dra. Nicole Kindred: [phone]  En caso de inclemencias del [place], por favor llame a Johnsie Kindred principal al [phone] para una actualizacin sobre el [place] de cualquier retraso o cierre.  Consejos para la medicacin en dermatologa: Por favor, guarde las cajas en las que vienen los medicamentos de uso tpico para ayudarle a seguir las instrucciones sobre dnde y cmo usarlos. Las farmacias generalmente imprimen las instrucciones del medicamento slo en las cajas y no directamente en los tubos del [place].   Si su medicamento es muy caro, por favor, pngase en contacto con Zigmund Daniel llamando al [phone] y presione la opcin 4 o envenos un mensaje a travs de Pharmacist, community.   No podemos decirle cul ser su copago por los medicamentos por adelantado ya que esto es diferente dependiendo de la cobertura de su seguro. Sin embargo, es posible que podamos encontrar un medicamento sustituto a Electrical engineer un formulario para que el seguro cubra el medicamento que se considera necesario.   Si se requiere una autorizacin previa para que su compaa de seguros [place] su medicamento, por favor permtanos de 1 a 2 das hbiles para completar este proceso.  Los precios de los medicamentos varan con frecuencia dependiendo del Environmental consultant de dnde se surte la receta y alguna farmacias pueden ofrecer precios ms baratos.  El sitio web www.goodrx.com tiene cupones para medicamentos de Airline pilot. Los precios aqu no tienen en cuenta lo que podra costar con la ayuda del seguro (puede ser ms barato con su seguro), pero el sitio web puede darle el precio si no utiliz Research scientist (physical sciences).  - Puede imprimir el cupn  correspondiente y llevarlo con su receta a la farmacia.  - Tambin puede pasar por nuestra oficina durante el horario de atencin regular y Charity fundraiser una tarjeta de cupones de GoodRx.  - Si necesita que su receta se enve electrnicamente a una farmacia diferente, informe a nuestra oficina a travs de MyChart de [place] o por telfono llamando al [phone] y presione la opcin 4.  Wound Care Instructions  Cleanse wound gently with soap and water once a day then pat dry with clean gauze. Apply a thing coat of Petrolatum (petroleum jelly, "Vaseline") over the wound (unless you have an allergy to this). We recommend that you use a new, sterile tube of Vaseline. Do not pick or remove scabs. Do not remove the yellow or white "healing tissue" from the base of the wound.  Cover the wound with fresh, clean, nonstick gauze and secure with paper tape. You may use Band-Aids in place of gauze and tape if the would is small enough, but would recommend trimming much of the  tape off as there is often too much. Sometimes Band-Aids can irritate the skin.  You should call the office for your biopsy report after 1 week if you have not already been contacted.  If you experience any problems, such as abnormal amounts of bleeding, swelling, significant bruising, significant pain, or evidence of infection, please call the office immediately.  FOR ADULT SURGERY PATIENTS: If you need something for pain relief you may take 1 extra strength Tylenol (acetaminophen) AND 2 Ibuprofen (200mg  each) together every 4 hours as needed for pain. (do not take these if you are allergic to them or if you have a reason you should not take them.) Typically, you may only need pain medication for 1 to 3 days.

## 2021-09-10 ENCOUNTER — Encounter: Payer: Self-pay | Admitting: Dermatology

## 2021-09-15 ENCOUNTER — Telehealth: Payer: Self-pay

## 2021-09-15 NOTE — Telephone Encounter (Signed)
-----   Message from Ralene Bathe, MD sent at 09/15/2021 10:37 AM EST ----- Diagnosis 1. Skin , right distal forearm SEBORRHEIC KERATOSIS, IRRITATED 2. Skin , left proximal forearm SEBORRHEIC KERATOSIS, IRRITATED  1&2 - both benign irritated keratosis No further treatment needed at this time.

## 2021-09-15 NOTE — Telephone Encounter (Signed)
Patient informed of pathology results 

## 2021-10-02 ENCOUNTER — Other Ambulatory Visit: Payer: Self-pay | Admitting: Unknown Physician Specialty

## 2021-10-02 DIAGNOSIS — R490 Dysphonia: Secondary | ICD-10-CM

## 2021-10-02 DIAGNOSIS — J3800 Paralysis of vocal cords and larynx, unspecified: Secondary | ICD-10-CM

## 2021-10-05 ENCOUNTER — Other Ambulatory Visit: Payer: Self-pay | Admitting: *Deleted

## 2021-10-05 DIAGNOSIS — C3402 Malignant neoplasm of left main bronchus: Secondary | ICD-10-CM

## 2021-10-07 ENCOUNTER — Other Ambulatory Visit: Payer: Self-pay | Admitting: Unknown Physician Specialty

## 2021-10-07 DIAGNOSIS — R490 Dysphonia: Secondary | ICD-10-CM

## 2021-10-07 DIAGNOSIS — J3800 Paralysis of vocal cords and larynx, unspecified: Secondary | ICD-10-CM

## 2021-10-08 ENCOUNTER — Ambulatory Visit
Admission: RE | Admit: 2021-10-08 | Discharge: 2021-10-08 | Disposition: A | Discharge status: Home / Self Care | Payer: Medicare Other | Source: Ambulatory Visit | Attending: Unknown Physician Specialty | Admitting: Unknown Physician Specialty

## 2021-10-08 DIAGNOSIS — J3800 Paralysis of vocal cords and larynx, unspecified: Secondary | ICD-10-CM

## 2021-10-08 DIAGNOSIS — R490 Dysphonia: Secondary | ICD-10-CM

## 2021-10-08 MED ORDER — IOPAMIDOL (ISOVUE-300) INJECTION 61%
100.0000 mL | Freq: Once | INTRAVENOUS | Status: AC | PRN
  Administered 2021-10-08: 100 mL via INTRAVENOUS

## 2021-10-14 ENCOUNTER — Other Ambulatory Visit: Payer: Self-pay | Admitting: Unknown Physician Specialty

## 2021-10-14 DIAGNOSIS — R633 Feeding difficulties, unspecified: Secondary | ICD-10-CM

## 2021-10-14 DIAGNOSIS — T17998A Other foreign object in respiratory tract, part unspecified causing other injury, initial encounter: Secondary | ICD-10-CM

## 2021-10-14 DIAGNOSIS — R1310 Dysphagia, unspecified: Secondary | ICD-10-CM

## 2021-10-14 DIAGNOSIS — T17908A Unspecified foreign body in respiratory tract, part unspecified causing other injury, initial encounter: Secondary | ICD-10-CM

## 2021-11-25 ENCOUNTER — Ambulatory Visit
Admission: RE | Admit: 2021-11-25 | Discharge: 2021-11-25 | Disposition: A | Discharge status: Home / Self Care | Payer: Medicare Other | Source: Ambulatory Visit | Attending: Unknown Physician Specialty | Admitting: Unknown Physician Specialty

## 2021-11-25 DIAGNOSIS — R633 Feeding difficulties, unspecified: Secondary | ICD-10-CM | POA: Diagnosis present

## 2021-11-25 DIAGNOSIS — T17908A Unspecified foreign body in respiratory tract, part unspecified causing other injury, initial encounter: Secondary | ICD-10-CM | POA: Diagnosis not present

## 2021-11-25 DIAGNOSIS — R1310 Dysphagia, unspecified: Secondary | ICD-10-CM | POA: Diagnosis present

## 2021-11-25 NOTE — Therapy (Signed)
[facility] ?[address] ?[place], [place], [address] ?Phone: [phone]   Fax:    ? ?Modified Barium Swallow ? ?Patient Details  ?Name: Steve Gregory ?MRN: [id number] ?Date of Birth: 1955/09/29 ?No data recorded ? ?Encounter Date: 11/25/2021 ? ? End of Session - 11/25/21 1753   ? ? Visit Number 1   ? Number of Visits 1   ? Date for SLP Re-Evaluation 11/25/21   ? SLP Start Time 0947   ? SLP Stop Time  1330   ? SLP Time Calculation (min) 25 min   ? Activity Tolerance Patient tolerated treatment well   ? ?  ?  ? ?  ? ? ?Past Medical History:  ?Diagnosis Date  ? Allergy   ? Anxiety   ? Arthritis   ? Barrett's esophagus   ? Bronchitis, chronic ([place])   ? Cancer [facility])   ? LUNG  ? COPD (chronic obstructive pulmonary disease) ([place])   ? Cough   ? CHRONIC  ? Depression   ? Dizziness   ? Dysphagia   ? Essential hypertension 06/28/2017  ? GERD (gastroesophageal reflux disease)   ? H/O emphysema   ? Headache   ? History of depression 06/05/2014  ? Last Assessment & Plan:  Mood is doing well on medications.   ? History of hiatal hernia   ? HOH (hard of hearing)   ? Hypertension   ? Lung mass   ? Maxillary fracture ([place])   ? LEFT SIDE  ? Orthopnea   ? Oxygen decrease   ? H/O HOME USE, NOT NOW  ? Personal history of colonic polyps   ? Pneumonia   ? PSEUDOMONAL PNEUMONIA IN THE PAST  ? PVD (peripheral vascular disease) ([place]) 06/05/2014  ? Overview:  Smoker with poor pulses  Last Assessment & Plan:  No leg pain is noted of late.   ? Renal failure   ? PRERENAL  ? Seizures ([place])   ? sounds like it could be vagal at times, coughing can cause him to fade out and see white lights. had last sz 3 months ago when not taking meds  ? Shortness of breath dyspnea   ? uses many different inhalers  ? Squamous cell carcinoma of skin 01/07/2020  ? right mid volar forearm, mid back spinal  ? Varicose veins of both lower extremities with pain 06/28/2017  ? Wheezing   ? ? ?Past Surgical History:   ?Procedure Laterality Date  ? CATARACT EXTRACTION W/PHACO Right 10/27/2015  ? Procedure: CATARACT EXTRACTION PHACO AND INTRAOCULAR LENS PLACEMENT ([place]) suture placed in right eye at end of procedure;  Surgeon: Estill Cotta, MD;  Location: [facility] ORS;  Service: Ophthalmology;  Laterality: Right;  [place] 01:02 ?AP% 24.4 ?CDE 28.12 ?fluid pack lot # [id number]  ? CATARACT EXTRACTION W/PHACO Left 11/17/2015  ? Procedure: CATARACT EXTRACTION PHACO AND INTRAOCULAR LENS PLACEMENT (I[facility]);  Surgeon: Estill Cotta, MD;  Location: [facility] ORS;  Service: Ophthalmology;  Laterality: Left;  [place] 01:29 ?AP% 24.3 ?CDE 40.05 ?fluid pack lot # [id number]  ? COLONOSCOPY WITH PROPOFOL N/A 07/28/2017  ? Procedure: COLONOSCOPY WITH PROPOFOL;  Surgeon: [name], MD;  Location: [facility] ENDOSCOPY;  Service: Endoscopy;  Laterality: N/A;  ? COLONOSCOPY WITH PROPOFOL N/A 06/09/2020  ? Procedure: COLONOSCOPY WITH PROPOFOL;  Surgeon: Lesly Rubenstein, MD;  Location: [facility] ENDOSCOPY;  Service: Endoscopy;  Laterality: N/A;  ? ESOPHAGOGASTRODUODENOSCOPY (EGD) WITH PROPOFOL N/A 06/06/2017  ? Procedure: ESOPHAGOGASTRODUODENOSCOPY (EGD) WITH PROPOFOL;  Surgeon: [name], MD;  Location: [facility] ENDOSCOPY;  Service: Endoscopy;  Laterality: N/A;  ? ESOPHAGOGASTRODUODENOSCOPY (EGD) WITH PROPOFOL N/A 06/09/2020  ? Procedure: ESOPHAGOGASTRODUODENOSCOPY (EGD) WITH PROPOFOL;  Surgeon: Lesly Rubenstein, MD;  Location: [facility] ENDOSCOPY;  Service: Endoscopy;  Laterality: N/A;  ? EYE SURGERY    ? FACIAL RECONSTRUCTION SURGERY  1980  ? LEFT CHEEK BONE INJURY 1980  ? ? ?There were no vitals filed for this visit. ? ? Subjective Assessment - 11/25/21 1529   ? ? Subjective "I have bad reflux."   ? Currently in Pain? No/denies   ? ?  ?  ? ?  ? ? ? ? 11/25/21 1500  ?SLP Visit Information  ?SLP Received On 11/25/21  ?Subjective  ?Subjective Pt reports occasional "wrong pipe" sensation, less often than 1x a week  ?Pain Assessment  ?Pain Assessment No/denies pain   ?General Information  ?Date of Onset 10/05/21  ?HPI Patient is a 66 y.o. male with past medical history of lung cancer, skin cancer, GERD, Barrett's esophagus, COPD, HTN, and PVD referred by Dr. Beverly Gust for MBS. Pt with left vocal cord paralysis; CT soft tissue neck 10/08/21 revealed "The left focal fold is asymmetrically atrophic suggesting longstanding weakness.No visible underlying mass or other cause." CT head 10/08/21: "no recent insult or evidence of metastatic disease." Upper GI study in 2019 showed moderate generalized gastritis, milder proximal duodenitis, minimal hiatal hernia with reflux of a full column of varium to the upper esophagus which is slow to empty, hypertrophy of the cricopharyngeus muscle  ?Type of Study MBS-Modified Barium Swallow Study  ?Previous Swallow Assessment see HPI  ?Diet Prior to this Study Regular;Thin liquids  ?Temperature Spikes Noted No  ?Respiratory Status Room air  ?History of Recent Intubation No  ?Behavior/Cognition Alert;Cooperative  ?Oral Cavity Assessment WFL  ?Oral Care Completed by SLP No  ?Oral Cavity - Dentition Poor condition ?(partials)  ?Self-Feeding Abilities Able to feed self  ?Patient Positioning Upright in chair;Other (comment) ?(Standing in AP for final 2 swallows)  ?Baseline Vocal Quality Hoarse;Breathy;Low vocal intensity  ?Volitional Cough Weak  ?Volitional Swallow Able to elicit  ?Anatomy Other (Comment) ?(left vocal cord paresis)  ?Oral Motor/Sensory Function  ?Overall Oral Motor/Sensory Function Mild impairment ?(hoarse/breathy quality secondary to L VC paresis)  ?Facial ROM WFL  ?Facial Symmetry WFL  ?Facial Strength WFL  ?Lingual ROM WFL  ?Lingual Symmetry WFL  ?Lingual Strength WFL  ?Velum WFL  ?Mandible WFL  ?Oral Preparation/Oral Phase  ?Oral Phase WFL  ?Pharyngeal Phase  ?Pharyngeal Phase WFL  ?Pharyngeal - Honey  ?Pharyngeal- Honey Teaspoon Pharyngeal residue - valleculae;Pharyngeal residue - posterior pharnyx  ?Pharyngeal Material does not  enter airway  ?Pharyngeal - Nectar  ?Pharyngeal- Nectar Teaspoon Pharyngeal residue - valleculae  ?Pharyngeal Material does not enter airway  ?Pharyngeal- Nectar Cup Pharyngeal residue - valleculae;Pharyngeal residue - posterior pharnyx  ?Pharyngeal Material does not enter airway  ?Pharyngeal - Thin  ?Pharyngeal- Thin Teaspoon WFL  ?Pharyngeal Material does not enter airway  ?Pharyngeal- Thin Cup Pharyngeal residue - valleculae;Pharyngeal residue - posterior pharnyx  ?Pharyngeal Material does not enter airway  ?Pharyngeal- Thin Straw Pharyngeal residue - valleculae;Pharyngeal residue - posterior pharnyx  ?Pharyngeal Material does not enter airway  ?Pharyngeal - Solids  ?Pharyngeal- Puree Pharyngeal residue - valleculae;Pharyngeal residue - posterior pharnyx  ?Pharyngeal- Regular Pharyngeal residue - valleculae  ?Cervical Esophageal Phase  ?Cervical Esophageal Phase Impaired  ?Cervical Esophageal Phase - Honey  ?Honey Teaspoon Reduced cricopharyngeal relaxation;Prominent cricopharyngeal segment  ?Cervical Esophageal Phase -  Nectar  ?Nectar Teaspoon Reduced cricopharyngeal relaxation;Prominent cricopharyngeal segment  ?Nectar Cup Reduced cricopharyngeal relaxation;Prominent cricopharyngeal segment  ?Cervical Esophageal Phase - Thin  ?Thin Teaspoon Reduced cricopharyngeal relaxation;Prominent cricopharyngeal segment  ?Thin Cup Reduced cricopharyngeal relaxation;Prominent cricopharyngeal segment  ?Thin Straw Prominent cricopharyngeal segment  ?Cervical Esophageal Phase - Solids  ?Puree Reduced cricopharyngeal relaxation;Prominent cricopharyngeal segment  ?Regular Reduced cricopharyngeal relaxation;Prominent cricopharyngeal segment  ?Clinical Impression  ?Clinical Impression Patient presents with grossly functional oropharyngeal swallow and suspected primary esophageal dysphagia. Despite left vocal cord paresis and atrophy, patient has adequate airway protection, with no laryngeal penetration or aspiration observed on  this exam. Oral stage is [facility], characterized by adequate bolus containment, oral control and anterior to posterior transfer. Swallow initiation occurs at the level of the valleculae. Pharyngeal stage is c

## 2021-12-09 ENCOUNTER — Ambulatory Visit: Payer: Medicare Other | Admitting: Dermatology

## 2021-12-31 ENCOUNTER — Ambulatory Visit: Payer: Medicare Other | Admitting: Dermatology

## 2022-03-19 ENCOUNTER — Other Ambulatory Visit: Payer: Self-pay | Admitting: Specialist

## 2022-03-19 DIAGNOSIS — R918 Other nonspecific abnormal finding of lung field: Secondary | ICD-10-CM

## 2022-04-06 ENCOUNTER — Ambulatory Visit
Admission: RE | Admit: 2022-04-06 | Discharge: 2022-04-06 | Disposition: A | Discharge status: Home / Self Care | Payer: Medicare Other | Source: Ambulatory Visit | Attending: Gastroenterology | Admitting: Gastroenterology

## 2022-04-06 ENCOUNTER — Ambulatory Visit: Payer: Medicare Other | Admitting: Anesthesiology

## 2022-04-06 ENCOUNTER — Encounter
Admission: RE | Disposition: A | Discharge status: Home / Self Care | Payer: Self-pay | Source: Ambulatory Visit | Attending: Gastroenterology

## 2022-04-06 ENCOUNTER — Encounter: Payer: Self-pay | Admitting: *Deleted

## 2022-04-06 DIAGNOSIS — K3189 Other diseases of stomach and duodenum: Secondary | ICD-10-CM | POA: Diagnosis not present

## 2022-04-06 DIAGNOSIS — J449 Chronic obstructive pulmonary disease, unspecified: Secondary | ICD-10-CM | POA: Diagnosis not present

## 2022-04-06 DIAGNOSIS — R1013 Epigastric pain: Secondary | ICD-10-CM | POA: Diagnosis present

## 2022-04-06 DIAGNOSIS — Z79899 Other long term (current) drug therapy: Secondary | ICD-10-CM | POA: Diagnosis not present

## 2022-04-06 DIAGNOSIS — G709 Myoneural disorder, unspecified: Secondary | ICD-10-CM | POA: Diagnosis not present

## 2022-04-06 DIAGNOSIS — Z85828 Personal history of other malignant neoplasm of skin: Secondary | ICD-10-CM | POA: Diagnosis not present

## 2022-04-06 DIAGNOSIS — I739 Peripheral vascular disease, unspecified: Secondary | ICD-10-CM | POA: Insufficient documentation

## 2022-04-06 DIAGNOSIS — K2100 Gastro-esophageal reflux disease with esophagitis, without bleeding: Secondary | ICD-10-CM | POA: Diagnosis not present

## 2022-04-06 DIAGNOSIS — K2270 Barrett's esophagus without dysplasia: Secondary | ICD-10-CM | POA: Diagnosis not present

## 2022-04-06 DIAGNOSIS — I10 Essential (primary) hypertension: Secondary | ICD-10-CM | POA: Insufficient documentation

## 2022-04-06 DIAGNOSIS — Z86010 Personal history of colonic polyps: Secondary | ICD-10-CM | POA: Diagnosis not present

## 2022-04-06 DIAGNOSIS — Z7951 Long term (current) use of inhaled steroids: Secondary | ICD-10-CM | POA: Diagnosis not present

## 2022-04-06 DIAGNOSIS — F419 Anxiety disorder, unspecified: Secondary | ICD-10-CM | POA: Diagnosis not present

## 2022-04-06 DIAGNOSIS — M1990 Unspecified osteoarthritis, unspecified site: Secondary | ICD-10-CM | POA: Insufficient documentation

## 2022-04-06 DIAGNOSIS — F17200 Nicotine dependence, unspecified, uncomplicated: Secondary | ICD-10-CM | POA: Diagnosis not present

## 2022-04-06 HISTORY — PX: ESOPHAGOGASTRODUODENOSCOPY (EGD) WITH PROPOFOL: SHX5813

## 2022-04-06 HISTORY — DX: Dysphonia: R49.0

## 2022-04-06 SURGERY — ESOPHAGOGASTRODUODENOSCOPY (EGD) WITH PROPOFOL
Anesthesia: General

## 2022-04-06 MED ORDER — PROPOFOL 500 MG/50ML IV EMUL
INTRAVENOUS | Status: DC | PRN
  Administered 2022-04-06: 145 ug/kg/min via INTRAVENOUS

## 2022-04-06 MED ORDER — ALBUTEROL SULFATE HFA 108 (90 BASE) MCG/ACT IN AERS
INHALATION_SPRAY | RESPIRATORY_TRACT | Status: AC
  Filled 2022-04-06: qty 6.7

## 2022-04-06 MED ORDER — PROPOFOL 10 MG/ML IV BOLUS
INTRAVENOUS | Status: DC | PRN
  Administered 2022-04-06: 50 mg via INTRAVENOUS

## 2022-04-06 MED ORDER — SODIUM CHLORIDE 0.9 % IV SOLN
INTRAVENOUS | Status: DC
Start: 2022-04-06 — End: 2022-04-06

## 2022-04-06 MED ORDER — ALBUTEROL SULFATE HFA 108 (90 BASE) MCG/ACT IN AERS
INHALATION_SPRAY | RESPIRATORY_TRACT | Status: DC | PRN
  Administered 2022-04-06: 3 via RESPIRATORY_TRACT

## 2022-04-06 MED ORDER — PROPOFOL 10 MG/ML IV BOLUS
INTRAVENOUS | Status: AC
  Filled 2022-04-06: qty 40

## 2022-04-06 NOTE — H&P (Signed)
Outpatient short stay form Pre-procedure 04/06/2022  Lesly Rubenstein, MD  Primary Physician: Kirk Ruths, MD  Reason for visit:  Dyspepsia  History of present illness:    66 y/o gentleman with COPD, lung cancer that's stable, and BE here for EGD for dyspeptic symptoms. No blood thinners. No family history of GI malignancies. No neck surgeries.    Current Facility-Administered Medications:    0.9 %  sodium chloride infusion, , Intravenous, Continuous, Alexee Delsanto, Hilton Cork, MD, Last Rate: 20 mL/hr at 04/06/22 1057, New Bag at 04/06/22 1057  Medications Prior to Admission  Medication Sig Dispense Refill Last Dose   albuterol (PROVENTIL) (2.5 MG/3ML) 0.083% nebulizer solution Take 2.5 mg by nebulization every 6 (six) hours as needed for wheezing or shortness of breath.   04/06/2022   fluticasone-salmeterol (ADVAIR HFA) 115-21 MCG/ACT inhaler Inhale 2 puffs into the lungs 2 (two) times daily.   04/05/2022   losartan (COZAAR) 50 MG tablet Take 50 mg by mouth daily.    04/05/2022   pantoprazole (PROTONIX) 40 MG tablet Take 40 mg by mouth 2 (two) times daily before a meal.   04/05/2022   predniSONE (DELTASONE) 10 MG tablet Take 50 mg daily--taper by 10 mg daily thereafter resume your chronic daily prednisone dose 15 tablet 0 04/05/2022   DULoxetine (CYMBALTA) 60 MG capsule Take 60 mg by mouth daily. (Patient not taking: No sig reported)      famotidine (PEPCID) 20 MG tablet Take 20 mg by mouth 2 (two) times daily. (Patient not taking: No sig reported)      fluticasone (FLONASE) 50 MCG/ACT nasal spray Place 2 sprays into both nostrils daily.      montelukast (SINGULAIR) 10 MG tablet Take 10 mg by mouth daily.       sucralfate (CARAFATE) 1 g tablet Take 1 g by mouth 2 (two) times daily. (Patient not taking: Reported on 01/21/2021)      SYMBICORT 160-4.5 MCG/ACT inhaler Inhale into the lungs. (Patient not taking: No sig reported)      theophylline (UNIPHYL) 400 MG 24 hr tablet Take 400 mg by mouth  daily.      VENTOLIN HFA 108 (90 Base) MCG/ACT inhaler Inhale 2 puffs into the lungs every 6 (six) hours as needed for wheezing or shortness of breath.         Allergies  Allergen Reactions   Chantix [Varenicline] Swelling        Lipitor [Atorvastatin] Swelling   Mobic [Meloxicam] Other (See Comments)    Myalgia   Statins Other (See Comments)    Myalgia     Past Medical History:  Diagnosis Date   Allergy    Anxiety    Arthritis    Barrett's esophagus    Bronchitis, chronic ([facility])    Cancer (H[facility])    LUNG   COPD (chronic obstructive pulmonary disease) ([facility])    Cough    CHRONIC   Depression    Dizziness    Dysphagia    Essential hypertension 06/28/2017   GERD (gastroesophageal reflux disease)    H/O emphysema    Headache    History of depression 06/05/2014   Last Assessment & Plan:  Mood is doing well on medications.    History of hiatal hernia    Hoarseness of voice    partial paralyzed vocal cord   HOH (hard of hearing)    Hypertension    Lung mass    Maxillary fracture ([facility])    LEFT SIDE   Orthopnea  Oxygen decrease    H/O HOME USE, NOT NOW   Personal history of colonic polyps    Pneumonia    PSEUDOMONAL PNEUMONIA IN THE PAST   PVD (peripheral vascular disease) ([place]) 06/05/2014   Overview:  Smoker with poor pulses  Last Assessment & Plan:  No leg pain is noted of late.    Renal failure    PRERENAL   Seizures ([facility])    sounds like it could be vagal at times, coughing can cause him to fade out and see white lights. had last sz 3 months ago when not taking meds   Shortness of breath dyspnea    uses many different inhalers   Squamous cell carcinoma of skin 01/07/2020   right mid volar forearm, mid back spinal   Varicose veins of both lower extremities with pain 06/28/2017   Wheezing     Review of systems:  Otherwise negative.    Physical Exam  Gen: Alert, oriented. Appears stated age.  HEENT: PERRLA. Lungs: No respiratory distress CV: RRR Abd:  soft, benign, no masses Ext: No edema    Planned procedures: Proceed with EGD. The patient understands the nature of the planned procedure, indications, risks, alternatives and potential complications including but not limited to bleeding, infection, perforation, damage to internal organs and possible oversedation/side effects from anesthesia. The patient agrees and gives consent to proceed.  Please refer to procedure notes for findings, recommendations and patient disposition/instructions.     Lesly Rubenstein, MD [facility]

## 2022-04-06 NOTE — Interval H&P Note (Signed)
History and Physical Interval Note:  04/06/2022 11:19 AM  Steve Gregory  has presented today for surgery, with the diagnosis of Dyspepsia,Barrett's esophagus.  The various methods of treatment have been discussed with the patient and family. After consideration of risks, benefits and other options for treatment, the patient has consented to  Procedure(s): ESOPHAGOGASTRODUODENOSCOPY (EGD) WITH PROPOFOL (N/A) as a surgical intervention.  The patient's history has been reviewed, patient examined, no change in status, stable for surgery.  I have reviewed the patient's chart and labs.  Questions were answered to the patient's satisfaction.     Lesly Rubenstein  Ok to proceed with EGD

## 2022-04-06 NOTE — Transfer of Care (Signed)
Immediate Anesthesia Transfer of Care Note  Patient: Steve Gregory  Procedure(s) Performed: ESOPHAGOGASTRODUODENOSCOPY (EGD) WITH PROPOFOL  Patient Location: PACU  Anesthesia Type:General  Level of Consciousness: awake and alert   Airway & Oxygen Therapy: Patient Spontanous Breathing and Patient connected to nasal cannula oxygen  Post-op Assessment: Report given to RN and Post -op Vital signs reviewed and stable  Post vital signs: Reviewed and stable  Last Vitals:  Vitals Value Taken Time  BP 95/63 04/06/22 1140  Temp 36.1 C 04/06/22 1140  Pulse 86 04/06/22 1140  Resp 16 04/06/22 1140  SpO2 98 % 04/06/22 1140  Vitals shown include unvalidated device data.  Last Pain:  Vitals:   04/06/22 1140  TempSrc: Temporal  PainSc: Asleep         Complications: No notable events documented.

## 2022-04-06 NOTE — Op Note (Signed)
[facility] Patient Name: Steve Gregory Procedure Date: 04/06/2022 11:26 AM MRN: [id number] Account #: [id number] Date of Birth: Nov 29, 1955 Admit Type: Outpatient Age: 66 Room: [facility] ENDO ROOM 1 Gender: Male Note Status: Finalized Instrument Name: Upper Endoscope [id number] Procedure:             Upper GI endoscopy Indications:           Dyspepsia Providers:             Andrey Farmer MD, MD Medicines:             Monitored Anesthesia Care Complications:         No immediate complications. Estimated blood loss:                         Minimal. Procedure:             Pre-Anesthesia Assessment:                        - Prior to the procedure, a History and Physical was                         performed, and patient medications and allergies were                         reviewed. The patient is competent. The risks and                         benefits of the procedure and the sedation options and                         risks were discussed with the patient. All questions                         were answered and informed consent was obtained.                         Patient identification and proposed procedure were                         verified by the physician, the nurse, the                         anesthesiologist, the anesthetist and the technician                         in the endoscopy suite. Mental Status Examination:                         alert and oriented. Airway Examination: normal                         oropharyngeal airway and neck mobility. Respiratory                         Examination: clear to auscultation. CV Examination:                         normal. Prophylactic Antibiotics: The patient does not  require prophylactic antibiotics. Prior                         Anticoagulants: The patient has taken no previous                         anticoagulant or antiplatelet agents. ASA Grade                          Assessment: III - A patient with severe systemic                         disease. After reviewing the risks and benefits, the                         patient was deemed in satisfactory condition to                         undergo the procedure. The anesthesia plan was to use                         monitored anesthesia care (MAC). Immediately prior to                         administration of medications, the patient was                         re-assessed for adequacy to receive sedatives. The                         heart rate, respiratory rate, oxygen saturations,                         blood pressure, adequacy of pulmonary ventilation, and                         response to care were monitored throughout the                         procedure. The physical status of the patient was                         re-assessed after the procedure.                        After obtaining informed consent, the endoscope was                         passed under direct vision. Throughout the procedure,                         the patient's blood pressure, pulse, and oxygen                         saturations were monitored continuously. The Endoscope                         was introduced through the mouth, and advanced to the  second part of duodenum. The upper GI endoscopy was                         accomplished without difficulty. The patient tolerated                         the procedure well. Findings:      There were esophageal mucosal changes classified as Barrett's stage       C0-M1 per Prague criteria present in the lower third of the esophagus.       The maximum longitudinal extent of these mucosal changes was 1 cm in       length. Mucosa was biopsied with a cold forceps for histology in a       targeted manner in the lower third of the esophagus. One specimen bottle       was sent to pathology. Estimated blood loss was minimal.      The entire examined stomach  was normal. Biopsies were taken with a cold       forceps for Helicobacter pylori testing. Estimated blood loss was       minimal.      Many small nodules with a diffuse distribution were found in the       duodenal bulb. Biopsies were taken with a cold forceps for histology.       Estimated blood loss was minimal.      The exam of the duodenum was otherwise normal. Impression:            - Esophageal mucosal changes classified as Barrett's                         stage C0-M1 per Prague criteria. Biopsied.                        - Normal stomach. Biopsied.                        - Nodule found in the duodenum. Biopsied. Recommendation:        - Discharge patient to home.                        - Resume previous diet.                        - Continue present medications.                        - Await pathology results.                        - Return to referring physician as previously                         scheduled. Procedure Code(s):     --- Professional ---                        [phone], Esophagogastroduodenoscopy, flexible,                         transoral; with biopsy, single or multiple Diagnosis Code(s):     --- Professional ---  K22.70, Barrett's esophagus without dysplasia                        K31.89, Other diseases of stomach and duodenum                        R10.13, Epigastric pain CPT copyright 2019 American [employer] Association. All rights reserved. The codes documented in this report are preliminary and upon coder review may  be revised to meet current compliance requirements. Andrey Farmer MD, MD 04/06/2022 11:40:53 AM Number of Addenda: 0 Note Initiated On: 04/06/2022 11:26 AM Estimated Blood Loss:  Estimated blood loss was minimal.      [facility]

## 2022-04-06 NOTE — Anesthesia Preprocedure Evaluation (Addendum)
Anesthesia Evaluation  Patient identified by MRN, date of birth, ID band Patient awake    Reviewed: Allergy & Precautions, H&P , NPO status , Patient's Chart, lab work & pertinent test results, reviewed documented beta blocker date and time   Airway Mallampati: II   Neck ROM: full    Dental  (+) Poor Dentition   Pulmonary shortness of breath, COPD,  COPD inhaler, Current Smoker and Patient abstained from smoking.,  No oxygen use at this time Malignant neoplasm of hilus of left lung    Pulmonary exam normal        Cardiovascular Exercise Tolerance: Poor hypertension, On Medications + Peripheral Vascular Disease and + Orthopnea  Normal cardiovascular exam Rhythm:regular Rate:Normal     Neuro/Psych  Headaches, Seizures: "sounds like it could be vagal at times, coughing can cause him to fade out and see white lights"  PSYCHIATRIC DISORDERS Anxiety  Neuromuscular disease    GI/Hepatic Neg liver ROS, hiatal hernia, GERD  Medicated,  Endo/Other  negative endocrine ROS  Renal/GU Renal disease  negative genitourinary   Musculoskeletal  (+) Arthritis ,   Abdominal Normal abdominal exam  (+)   Peds  Hematology negative hematology ROS (+)   Anesthesia Other Findings Modified Barium Swallow 2023: Left vocal cord paresis and atrophy  Past Medical History: No date: Allergy No date: Anxiety No date: Arthritis No date: Barrett's esophagus No date: Bronchitis, chronic (HCC) No date: Cancer ([place])     Comment:  LUNG No date: COPD (chronic obstructive pulmonary disease) (HCC) No date: Cough     Comment:  CHRONIC No date: Depression No date: Dizziness No date: Dysphagia 06/28/2017: Essential hypertension No date: GERD (gastroesophageal reflux disease) No date: H/O emphysema No date: Headache 06/05/2014: History of depression     Comment:  Last Assessment & Plan:  Mood is doing well on               medications.  No date:  History of hiatal hernia No date: HOH (hard of hearing) No date: Hypertension No date: Lung mass No date: Maxillary fracture (HCC)     Comment:  LEFT SIDE No date: Orthopnea No date: Oxygen decrease     Comment:  H/O HOME USE, NOT NOW No date: Personal history of colonic polyps No date: Pneumonia     Comment:  PSEUDOMONAL PNEUMONIA IN THE PAST 06/05/2014: PVD (peripheral vascular disease) ([place])     Comment:  Overview:  Smoker with poor pulses  Last Assessment &               Plan:  No leg pain is noted of late.  No date: Renal failure     Comment:  PRERENAL No date: Seizures ([place])     Comment:  sounds like it could be vagal at times, coughing can               cause him to fade out and see white lights. had last sz 3              months ago when not taking meds No date: Shortness of breath dyspnea     Comment:  uses many different inhalers 01/07/2020: Squamous cell carcinoma of skin     Comment:  right mid volar forearm, mid back spinal 06/28/2017: Varicose veins of both lower extremities with pain No date: Wheezing Past Surgical History: 10/27/2015: CATARACT EXTRACTION W/PHACO; Right     Comment:  Procedure: CATARACT EXTRACTION PHACO AND INTRAOCULAR  LENS PLACEMENT (IOC) suture placed in right eye at end of              procedure;  Surgeon: Estill Cotta, MD;  Location:               [facility] ORS;  Service: Ophthalmology;  Laterality: Right;                [place] 01:02 AP% 24.4 CDE 28.12 fluid pack lot # [id number] 11/17/2015: CATARACT EXTRACTION W/PHACO; Left     Comment:  Procedure: CATARACT EXTRACTION PHACO AND INTRAOCULAR               LENS PLACEMENT (IOC);  Surgeon: Estill Cotta, MD;                Location: [facility] ORS;  Service: Ophthalmology;  Laterality:              Left;  [place] 01:29 AP% 24.3 CDE 40.05 fluid pack lot #               [id number] 07/28/2017: COLONOSCOPY WITH PROPOFOL; N/A     Comment:  Procedure: COLONOSCOPY WITH PROPOFOL;  Surgeon:                [name], MD;  Location: [facility] ENDOSCOPY;                Service: Endoscopy;  Laterality: N/A; 06/06/2017: ESOPHAGOGASTRODUODENOSCOPY (EGD) WITH PROPOFOL; N/A     Comment:  Procedure: ESOPHAGOGASTRODUODENOSCOPY (EGD) WITH               PROPOFOL;  Surgeon: [name], MD;  Location:               [facility] ENDOSCOPY;  Service: Endoscopy;  Laterality: N/A; No date: EYE SURGERY 1980: FACIAL RECONSTRUCTION SURGERY     Comment:  LEFT CHEEK BONE INJURY 1980 BMI    Body Mass Index: 22.53 kg/m     Reproductive/Obstetrics negative OB ROS                           Anesthesia Physical  Anesthesia Plan  ASA: III  Anesthesia Plan: General   Post-op Pain Management: Minimal or no pain anticipated   Induction: Intravenous  PONV Risk Score and Plan:   Airway Management Planned: Natural Airway and Simple Face Mask  Additional Equipment:   Intra-op Plan:   Post-operative Plan:   Informed Consent: I have reviewed the patients History and Physical, chart, labs and discussed the procedure including the risks, benefits and alternatives for the proposed anesthesia with the patient or authorized representative who has indicated his/her understanding and acceptance.     Dental Advisory Given  Plan Discussed with: CRNA  Anesthesia Plan Comments:        Anesthesia Quick Evaluation

## 2022-04-06 NOTE — Anesthesia Postprocedure Evaluation (Signed)
Anesthesia Post Note  Patient: Steve Gregory  Procedure(s) Performed: ESOPHAGOGASTRODUODENOSCOPY (EGD) WITH PROPOFOL  Patient location during evaluation: PACU Anesthesia Type: General Level of consciousness: awake and alert Pain management: pain level controlled Vital Signs Assessment: post-procedure vital signs reviewed and stable Respiratory status: spontaneous breathing, nonlabored ventilation and respiratory function stable Cardiovascular status: blood pressure returned to baseline and stable Postop Assessment: no apparent nausea or vomiting Anesthetic complications: no   No notable events documented.   Last Vitals:  Vitals:   04/06/22 1140 04/06/22 1200  BP: 95/63 108/63  Pulse:  82  Resp: 16 19  Temp: (!) 36.1 C   SpO2: 98% 98%    Last Pain:  Vitals:   04/06/22 1200  TempSrc:   PainSc: 0-No pain                 [place] Ouch

## 2022-04-07 ENCOUNTER — Encounter: Payer: Self-pay | Admitting: Gastroenterology

## 2022-04-07 LAB — SURGICAL PATHOLOGY

## 2022-04-26 ENCOUNTER — Ambulatory Visit
Admission: RE | Admit: 2022-04-26 | Discharge: 2022-04-26 | Disposition: A | Discharge status: Home / Self Care | Payer: Medicare Other | Source: Ambulatory Visit | Attending: Specialist | Admitting: Specialist

## 2022-04-26 DIAGNOSIS — R918 Other nonspecific abnormal finding of lung field: Secondary | ICD-10-CM

## 2022-04-28 ENCOUNTER — Other Ambulatory Visit: Payer: Self-pay | Admitting: Specialist

## 2022-04-28 DIAGNOSIS — R918 Other nonspecific abnormal finding of lung field: Secondary | ICD-10-CM

## 2022-05-11 ENCOUNTER — Ambulatory Visit: Payer: Medicare Other | Admitting: Radiation Oncology

## 2022-05-11 ENCOUNTER — Institutional Professional Consult (permissible substitution): Payer: Medicare Other | Admitting: Radiation Oncology

## 2022-05-13 ENCOUNTER — Ambulatory Visit
Admission: RE | Admit: 2022-05-13 | Discharge: 2022-05-13 | Disposition: A | Discharge status: Home / Self Care | Payer: Medicare Other | Source: Ambulatory Visit | Attending: Specialist | Admitting: Specialist

## 2022-05-13 DIAGNOSIS — R918 Other nonspecific abnormal finding of lung field: Secondary | ICD-10-CM | POA: Diagnosis present

## 2022-05-13 DIAGNOSIS — C3490 Malignant neoplasm of unspecified part of unspecified bronchus or lung: Secondary | ICD-10-CM | POA: Insufficient documentation

## 2022-05-13 LAB — GLUCOSE, CAPILLARY: Glucose-Capillary: 100 mg/dL — ABNORMAL HIGH (ref 70–99)

## 2022-05-13 MED ORDER — FLUDEOXYGLUCOSE F - 18 (FDG) INJECTION
7.9000 | Freq: Once | INTRAVENOUS | Status: AC | PRN
  Administered 2022-05-13: 8.54 via INTRAVENOUS

## 2022-05-20 ENCOUNTER — Ambulatory Visit
Admission: RE | Admit: 2022-05-20 | Discharge: 2022-05-20 | Disposition: A | Discharge status: Home / Self Care | Payer: Medicare Other | Source: Ambulatory Visit | Attending: Radiation Oncology | Admitting: Radiation Oncology

## 2022-05-20 VITALS — BP 131/73 | HR 93 | Temp 97.0°F | Ht 69.0 in | Wt 152.4 lb

## 2022-05-20 DIAGNOSIS — C3431 Malignant neoplasm of lower lobe, right bronchus or lung: Secondary | ICD-10-CM | POA: Insufficient documentation

## 2022-05-20 DIAGNOSIS — Z8719 Personal history of other diseases of the digestive system: Secondary | ICD-10-CM | POA: Insufficient documentation

## 2022-05-20 DIAGNOSIS — Z7951 Long term (current) use of inhaled steroids: Secondary | ICD-10-CM | POA: Diagnosis not present

## 2022-05-20 DIAGNOSIS — C3412 Malignant neoplasm of upper lobe, left bronchus or lung: Secondary | ICD-10-CM

## 2022-05-20 DIAGNOSIS — I10 Essential (primary) hypertension: Secondary | ICD-10-CM | POA: Insufficient documentation

## 2022-05-20 DIAGNOSIS — I739 Peripheral vascular disease, unspecified: Secondary | ICD-10-CM | POA: Insufficient documentation

## 2022-05-20 DIAGNOSIS — Z79899 Other long term (current) drug therapy: Secondary | ICD-10-CM | POA: Insufficient documentation

## 2022-05-20 DIAGNOSIS — Z87891 Personal history of nicotine dependence: Secondary | ICD-10-CM | POA: Diagnosis not present

## 2022-05-20 DIAGNOSIS — Z85828 Personal history of other malignant neoplasm of skin: Secondary | ICD-10-CM | POA: Insufficient documentation

## 2022-05-20 DIAGNOSIS — J449 Chronic obstructive pulmonary disease, unspecified: Secondary | ICD-10-CM | POA: Diagnosis not present

## 2022-05-20 DIAGNOSIS — K219 Gastro-esophageal reflux disease without esophagitis: Secondary | ICD-10-CM | POA: Insufficient documentation

## 2022-05-20 NOTE — Consult Note (Signed)
NEW PATIENT EVALUATION  Name: Steve Gregory  MRN: [id number]  Date:   05/20/2022     DOB: March 13, 1956   This 66 y.o. male patient presents to the clinic for initial evaluation of new stage I non-small cell lung cancer of the right lower lobe and patient previously treated in 2019 with SBRT to prevascular lymph node and patient with known history of non-small cell lung cancer and clinical remission.Marland Kitchen  REFERRING PHYSICIAN: Kirk Ruths, MD  CHIEF COMPLAINT:  Chief Complaint  Patient presents with   Lung Cancer    Consult    DIAGNOSIS: The encounter diagnosis was Malignant neoplasm of upper lobe of left lung ([place]).   PREVIOUS INVESTIGATIONS:  CT PET CT scan reviewed Clinical notes reviewed Labs reviewed  HPI: Patient is a 66 year old male well-known to our department having been treated back in 2019 with SBRT to progressive prevascular space lymph node in a patient with known history of non-small cell lung cancer in clinical remission.  He has done extremely well.  On recent CT scan he was noted to have a solid spiculated 3.2 cm lesion in the posterior right lower lobe which had increased in density and size since his previous scan in February 23.  He underwent a PET CT scan again showing a hypermetabolic lesion of the right lower lobe consistent with metachronous lung cancer no mediastinal hilar adenopathy was identified.  He had stable appearing posttreatment changes in the left upper lobe from previous treatment.  He is asymptomatic specifically denies cough hemoptysis or chest tightness he is now referred to ration collagen for consideration of treatment.  PLANNED TREATMENT REGIMEN: SBRT  PAST MEDICAL HISTORY:  has a past medical history of Allergy, Anxiety, Arthritis, Barrett's esophagus, Bronchitis, chronic (HCC), Cancer ([place]), COPD (chronic obstructive pulmonary disease) ([place]), Cough, Depression, Dizziness, Dysphagia, Essential hypertension (06/28/2017), GERD (gastroesophageal  reflux disease), H/O emphysema, Headache, History of depression (06/05/2014), History of hiatal hernia, Hoarseness of voice, HOH (hard of hearing), Hypertension, Lung mass, Maxillary fracture ([place]), Orthopnea, Oxygen decrease, Personal history of colonic polyps, Pneumonia, PVD (peripheral vascular disease) ([place]) (06/05/2014), Renal failure, Seizures ([place]), Shortness of breath dyspnea, Squamous cell carcinoma of skin (01/07/2020), Varicose veins of both lower extremities with pain (06/28/2017), and Wheezing.    PAST SURGICAL HISTORY:  Past Surgical History:  Procedure Laterality Date   CATARACT EXTRACTION W/PHACO Right 10/27/2015   Procedure: CATARACT EXTRACTION PHACO AND INTRAOCULAR LENS PLACEMENT ([place]) suture placed in right eye at end of procedure;  Surgeon: Estill Cotta, MD;  Location: [facility] ORS;  Service: Ophthalmology;  Laterality: Right;  [place] 01:02 AP% 24.4 CDE 28.12 fluid pack lot # [id number]   CATARACT EXTRACTION W/PHACO Left 11/17/2015   Procedure: CATARACT EXTRACTION PHACO AND INTRAOCULAR LENS PLACEMENT (IOC);  Surgeon: Estill Cotta, MD;  Location: [facility] ORS;  Service: Ophthalmology;  Laterality: Left;  [place] 01:29 AP% 24.3 CDE 40.05 fluid pack lot # [id number]   COLONOSCOPY WITH PROPOFOL N/A 07/28/2017   Procedure: COLONOSCOPY WITH PROPOFOL;  Surgeon: [name], MD;  Location[facility] ENDOSCOPY;  Service: Endoscopy;  Laterality: N/A;   COLONOSCOPY WITH PROPOFOL N/A 06/09/2020   Procedure: COLONOSCOPY WITH PROPOFOL;  Surgeon: Lesly Rubenstein, MD;  Location: [facility] ENDOSCOPY;  Service: Endoscopy;  Laterality: N/A;   ESOPHAGOGASTRODUODENOSCOPY (EGD) WITH PROPOFOL N/A 06/06/2017   Procedure: ESOPHAGOGASTRODUODENOSCOPY (EGD) WITH PROPOFOL;  Surgeon: [name], MD;  Location: [facility] ENDOSCOPY;  Service: Endoscopy;  Laterality: N/A;   ESOPHAGOGASTRODUODENOSCOPY (EGD) WITH PROPOFOL N/A 06/09/2020   Procedure: ESOPHAGOGASTRODUODENOSCOPY (EGD)  WITH PROPOFOL;  Surgeon: Lesly Rubenstein, MD;  Location[facility] END[facility]COPY;  Service: Endoscopy;  Laterality: N/A;   ESOPHAGOGASTRODUODENOSCOPY (EGD) WITH PROPOFOL N/A 04/06/2022   Procedure: ESOPHAGOGASTRODUODENOSCOPY (EGD) WITH PROPOFOL;  Surgeon: Lesly Rubenstein, MD;  Location: [facility] ENDOSCOPY;  Service: Endoscopy;  Laterality: N/A;   EYE SURGERY     FACIAL RECONSTRUCTION SURGERY  1980   LEFT CHEEK BONE INJURY 1980    FAMILY HISTORY: family history includes Aneurysm in his father; Heart disease in his brother; Hyperlipidemia in his mother; Hypertension in his brother; Stroke in his father.  SOCIAL HISTORY:  reports that he has been smoking cigarettes. He has a 43.00 pack-year smoking history. He has never used smokeless tobacco. He reports that he does not drink alcohol and does not use drugs.  ALLERGIES: Chantix [varenicline], Lipitor [atorvastatin], Mobic [meloxicam], and Statins  MEDICATIONS:  Current Outpatient Medications  Medication Sig Dispense Refill   albuterol (PROVENTIL) (2.5 MG/3ML) 0.083% nebulizer solution Take 2.5 mg by nebulization every 6 (six) hours as needed for wheezing or shortness of breath.     DULoxetine (CYMBALTA) 60 MG capsule Take 60 mg by mouth daily. (Patient not taking: No sig reported)     famotidine (PEPCID) 20 MG tablet Take 20 mg by mouth 2 (two) times daily. (Patient not taking: No sig reported)     fluticasone (FLONASE) 50 MCG/ACT nasal spray Place 2 sprays into both nostrils daily.     fluticasone-salmeterol (ADVAIR HFA) 115-21 MCG/ACT inhaler Inhale 2 puffs into the lungs 2 (two) times daily.     losartan (COZAAR) 50 MG tablet Take 50 mg by mouth daily.      montelukast (SINGULAIR) 10 MG tablet Take 10 mg by mouth daily.      pantoprazole (PROTONIX) 40 MG tablet Take 40 mg by mouth 2 (two) times daily before a meal.     predniSONE (DELTASONE) 10 MG tablet Take 50 mg daily--taper by 10 mg daily thereafter resume your chronic daily prednisone dose 15 tablet 0   sucralfate (CARAFATE) 1  g tablet Take 1 g by mouth 2 (two) times daily. (Patient not taking: Reported on 01/21/2021)     SYMBICORT 160-4.5 MCG/ACT inhaler Inhale into the lungs. (Patient not taking: No sig reported)     theophylline (UNIPHYL) 400 MG 24 hr tablet Take 400 mg by mouth daily.     VENTOLIN HFA 108 (90 Base) MCG/ACT inhaler Inhale 2 puffs into the lungs every 6 (six) hours as needed for wheezing or shortness of breath.      No current facility-administered medications for this encounter.    ECOG PERFORMANCE STATUS:  0 - Asymptomatic  REVIEW OF SYSTEMS: Patient denies any weight loss, fatigue, weakness, fever, chills or night sweats. Patient denies any loss of vision, blurred vision. Patient denies any ringing  of the ears or hearing loss. No irregular heartbeat. Patient denies heart murmur or history of fainting. Patient denies any chest pain or pain radiating to her upper extremities. Patient denies any shortness of breath, difficulty breathing at night, cough or hemoptysis. Patient denies any swelling in the lower legs. Patient denies any nausea vomiting, vomiting of blood, or coffee ground material in the vomitus. Patient denies any stomach pain. Patient states has had normal bowel movements no significant constipation or diarrhea. Patient denies any dysuria, hematuria or significant nocturia. Patient denies any problems walking, swelling in the joints or loss of balance. Patient denies any skin changes, loss of hair or loss of weight. Patient denies  any excessive worrying or anxiety or significant depression. Patient denies any problems with insomnia. Patient denies excessive thirst, polyuria, polydipsia. Patient denies any swollen glands, patient denies easy bruising or easy bleeding. Patient denies any recent infections, allergies or URI. Patient "s visual fields have not changed significantly in recent time.   PHYSICAL EXAM: BP 131/73   Pulse 93   Temp (!) 97 F (36.1 C)   Ht 5\' 9"  (1.753 m)   Wt 152 lb  6.4 oz (69.1 kg)   BMI 22.51 kg/m  Well-developed well-nourished patient in NAD. HEENT reveals PERLA, EOMI, discs not visualized.  Oral cavity is clear. No oral mucosal lesions are identified. Neck is clear without evidence of cervical or supraclavicular adenopathy. Lungs are clear to A&P. Cardiac examination is essentially unremarkable with regular rate and rhythm without murmur rub or thrill. Abdomen is benign with no organomegaly or masses noted. Motor sensory and DTR levels are equal and symmetric in the upper and lower extremities. Cranial nerves II through XII are grossly intact. Proprioception is intact. No peripheral adenopathy or edema is identified. No motor or sensory levels are noted. Crude visual fields are within normal range.  LABORATORY DATA: Labs reviewed compatible with above-stated findings    RADIOLOGY RESULTS: CT and PET CT scan reviewed compatible with above-stated findings   IMPRESSION: New stage I non-small cell lung cancer the right lower lobe and 66 year old male previously treated with SBRT for non-small cell lung cancer  PLAN: At this time like to go ahead with SBRT to his right lower lobe.  Would plan on delivering 60 Gray over 5 fractions risks and benefits of treatment including extremely low side effect profile were reviewed with the patient.  I have set him up for CT simulation will use for dimensional treatment planning as well as motion restriction for simulation.  Patient comprehends my recommendations well.  I would like to take this opportunity to thank you for allowing me to participate in the care of your patient.Noreene Filbert, MD

## 2022-05-27 ENCOUNTER — Ambulatory Visit
Admission: RE | Admit: 2022-05-27 | Discharge: 2022-05-27 | Disposition: A | Discharge status: Home / Self Care | Payer: Medicare Other | Source: Ambulatory Visit | Attending: Radiation Oncology | Admitting: Radiation Oncology

## 2022-05-27 DIAGNOSIS — C3431 Malignant neoplasm of lower lobe, right bronchus or lung: Secondary | ICD-10-CM | POA: Diagnosis present

## 2022-06-03 DIAGNOSIS — C3431 Malignant neoplasm of lower lobe, right bronchus or lung: Secondary | ICD-10-CM | POA: Diagnosis present

## 2022-06-08 ENCOUNTER — Ambulatory Visit
Admission: RE | Admit: 2022-06-08 | Discharge: 2022-06-08 | Disposition: A | Discharge status: Home / Self Care | Payer: Medicare Other | Source: Ambulatory Visit | Attending: Radiation Oncology | Admitting: Radiation Oncology

## 2022-06-08 ENCOUNTER — Other Ambulatory Visit: Payer: Self-pay

## 2022-06-08 DIAGNOSIS — C3431 Malignant neoplasm of lower lobe, right bronchus or lung: Secondary | ICD-10-CM | POA: Diagnosis not present

## 2022-06-08 LAB — RAD ONC ARIA SESSION SUMMARY
Course Elapsed Days: 0
Plan Fractions Treated to Date: 1
Plan Prescribed Dose Per Fraction: 12 Gy
Plan Total Fractions Prescribed: 5
Plan Total Prescribed Dose: 60 Gy
Reference Point Dosage Given to Date: 12 Gy
Reference Point Session Dosage Given: 12 Gy
Session Number: 1

## 2022-06-10 ENCOUNTER — Ambulatory Visit
Admission: RE | Admit: 2022-06-10 | Discharge: 2022-06-10 | Disposition: A | Discharge status: Home / Self Care | Payer: Medicare Other | Source: Ambulatory Visit | Attending: Radiation Oncology | Admitting: Radiation Oncology

## 2022-06-10 ENCOUNTER — Other Ambulatory Visit: Payer: Self-pay

## 2022-06-10 DIAGNOSIS — C3431 Malignant neoplasm of lower lobe, right bronchus or lung: Secondary | ICD-10-CM | POA: Diagnosis not present

## 2022-06-10 LAB — RAD ONC ARIA SESSION SUMMARY
Course Elapsed Days: 2
Plan Fractions Treated to Date: 2
Plan Prescribed Dose Per Fraction: 12 Gy
Plan Total Fractions Prescribed: 5
Plan Total Prescribed Dose: 60 Gy
Reference Point Dosage Given to Date: 24 Gy
Reference Point Session Dosage Given: 12 Gy
Session Number: 2

## 2022-06-15 ENCOUNTER — Other Ambulatory Visit: Payer: Self-pay | Admitting: *Deleted

## 2022-06-15 ENCOUNTER — Other Ambulatory Visit: Payer: Self-pay

## 2022-06-15 ENCOUNTER — Ambulatory Visit
Admission: RE | Admit: 2022-06-15 | Discharge: 2022-06-15 | Disposition: A | Discharge status: Home / Self Care | Payer: Medicare Other | Source: Ambulatory Visit | Attending: Radiation Oncology | Admitting: Radiation Oncology

## 2022-06-15 DIAGNOSIS — C3412 Malignant neoplasm of upper lobe, left bronchus or lung: Secondary | ICD-10-CM

## 2022-06-15 DIAGNOSIS — C3431 Malignant neoplasm of lower lobe, right bronchus or lung: Secondary | ICD-10-CM | POA: Diagnosis not present

## 2022-06-15 LAB — RAD ONC ARIA SESSION SUMMARY
Course Elapsed Days: 7
Plan Fractions Treated to Date: 3
Plan Prescribed Dose Per Fraction: 12 Gy
Plan Total Fractions Prescribed: 5
Plan Total Prescribed Dose: 60 Gy
Reference Point Dosage Given to Date: 36 Gy
Reference Point Session Dosage Given: 12 Gy
Session Number: 3

## 2022-06-17 ENCOUNTER — Other Ambulatory Visit: Payer: Self-pay

## 2022-06-17 ENCOUNTER — Inpatient Hospital Stay: Payer: Medicare Other | Attending: Oncology

## 2022-06-17 ENCOUNTER — Ambulatory Visit
Admission: RE | Admit: 2022-06-17 | Discharge: 2022-06-17 | Disposition: A | Discharge status: Home / Self Care | Payer: Medicare Other | Source: Ambulatory Visit | Attending: Radiation Oncology | Admitting: Radiation Oncology

## 2022-06-17 DIAGNOSIS — C3431 Malignant neoplasm of lower lobe, right bronchus or lung: Secondary | ICD-10-CM | POA: Diagnosis not present

## 2022-06-17 LAB — RAD ONC ARIA SESSION SUMMARY
Course Elapsed Days: 9
Plan Fractions Treated to Date: 4
Plan Prescribed Dose Per Fraction: 12 Gy
Plan Total Fractions Prescribed: 5
Plan Total Prescribed Dose: 60 Gy
Reference Point Dosage Given to Date: 48 Gy
Reference Point Session Dosage Given: 12 Gy
Session Number: 4

## 2022-06-17 NOTE — Progress Notes (Signed)
Nutrition Assessment   Reason for Assessment:  Nursing referral for assistance with oral nutrition supplements  ASSESSMENT:  66 year old male with stage I non small cell lung cancer.  Past medical history of HTN, PVD, COPD, GERD, lung cancer in 2019.  Patient currently receiving radiation.  Acute add on to schedule today.  Met with patient following radiation.  Says that he generally eats about 1 meal a day.  Goes out to eat or prepares frozen meals.  Does not have the energy to cook.  Says that his weight has been decreasing over the last 6 months. Can't afford oral nutrition supplements.     Medications: protonix   Labs: no new   Anthropometrics:   Height: 69 inches Weight: 152 lb 9/21 UBW: 164 lb 6 months ago per patient BMI: 22  7% weight loss in the last 6 months   NUTRITION DIAGNOSIS: Unintentional weight loss related to cancer as evidenced by 7% weight loss in the last 6 months, decreased intake   INTERVENTION:  Complimentary case of ensure high protein given to patient today.  Discussed easy to prepare foods to have on hand (frozen meals, pudding cups, peanut butter, pimento cheese, etc) Contact information provided to patient    Next Visit:  No follow-up, last treatment 10/24 Patient to call RD if needed in the future  Torryn Hudspeth B. Zenia Resides, [place], [place] Registered Dietitian [phone]

## 2022-06-22 ENCOUNTER — Ambulatory Visit
Admission: RE | Admit: 2022-06-22 | Discharge: 2022-06-22 | Disposition: A | Discharge status: Home / Self Care | Payer: Medicare Other | Source: Ambulatory Visit | Attending: Radiation Oncology | Admitting: Radiation Oncology

## 2022-06-22 ENCOUNTER — Other Ambulatory Visit: Payer: Self-pay

## 2022-06-22 DIAGNOSIS — C3431 Malignant neoplasm of lower lobe, right bronchus or lung: Secondary | ICD-10-CM | POA: Diagnosis not present

## 2022-06-22 LAB — RAD ONC ARIA SESSION SUMMARY
Course Elapsed Days: 14
Plan Fractions Treated to Date: 5
Plan Prescribed Dose Per Fraction: 12 Gy
Plan Total Fractions Prescribed: 5
Plan Total Prescribed Dose: 60 Gy
Reference Point Dosage Given to Date: 60 Gy
Reference Point Session Dosage Given: 12 Gy
Session Number: 5

## 2022-07-19 ENCOUNTER — Ambulatory Visit: Admission: RE | Admit: 2022-07-19 | Payer: Medicare Other | Source: Ambulatory Visit

## 2022-07-26 ENCOUNTER — Ambulatory Visit: Payer: Medicare Other | Admitting: Radiation Oncology

## 2022-08-12 ENCOUNTER — Encounter: Payer: Self-pay | Admitting: Radiation Oncology

## 2022-08-12 ENCOUNTER — Ambulatory Visit
Admission: RE | Admit: 2022-08-12 | Discharge: 2022-08-12 | Disposition: A | Discharge status: Home / Self Care | Payer: Medicare Other | Source: Ambulatory Visit | Attending: Radiation Oncology | Admitting: Radiation Oncology

## 2022-08-12 VITALS — BP 126/82 | HR 108 | Temp 97.3°F | Resp 22 | Ht 69.0 in | Wt 147.0 lb

## 2022-08-12 DIAGNOSIS — C3412 Malignant neoplasm of upper lobe, left bronchus or lung: Secondary | ICD-10-CM | POA: Insufficient documentation

## 2022-08-12 NOTE — Progress Notes (Signed)
Radiation Oncology Follow up Note  Name: Steve Gregory   Date:   08/12/2022 MRN:  [id number] DOB: 1955/09/11    This 66 y.o. male presents to the clinic today for 1 month follow-up status post SBRT for stage I non-small cell lung cancer of the right lower lobe and patient previously previously treated in 2019 with SBRT.Marland Kitchen  REFERRING PROVIDER: Kirk Ruths, MD  HPI: Patient is a 66 year old male now out 1 month having completed SBRT for stage I non-small cell lung cancer of the right lower lobe.  Seen today in follow-up he is has a persistent slightly productive cough recently put on some cough medicine for that.  He specifically denies hemoptysis.  He feels quite fatigued and somewhat frail..  COMPLICATIONS OF TREATMENT: none  FOLLOW UP COMPLIANCE: keeps appointments   PHYSICAL EXAM:  BP 126/82   Pulse (!) 108   Temp (!) 97.3 F (36.3 C)   Resp (!) 22   Ht 5\' 9"  (1.753 m)   Wt 147 lb (66.7 kg)   BMI 21.71 kg/m  [place] male appearing older than stated age.  Well-developed well-nourished patient in NAD. HEENT reveals PERLA, EOMI, discs not visualized.  Oral cavity is clear. No oral mucosal lesions are identified. Neck is clear without evidence of cervical or supraclavicular adenopathy. Lungs are clear to A&P. Cardiac examination is essentially unremarkable with regular rate and rhythm without murmur rub or thrill. Abdomen is benign with no organomegaly or masses noted. Motor sensory and DTR levels are equal and symmetric in the upper and lower extremities. Cranial nerves II through XII are grossly intact. Proprioception is intact. No peripheral adenopathy or edema is identified. No motor or sensory levels are noted. Crude visual fields are within normal range.  RADIOLOGY RESULTS: No current films for review CT scan ordered in 3 months  PLAN: Present time patient from a pulmonary standpoint is stable.  He continues to have a cough which we would expect after SBRT.  No  hemoptysis or significant chest tightness.  I have asked to see him back in 3 months for follow-up with a repeat CT scan scan at that time.  Patient is to call with any concerns.  I would like to take this opportunity to thank you for allowing me to participate in the care of your patient.Noreene Filbert, MD

## 2022-08-31 ENCOUNTER — Ambulatory Visit: Payer: Medicare Other | Admitting: Radiation Oncology

## 2022-11-08 ENCOUNTER — Ambulatory Visit
Admission: RE | Admit: 2022-11-08 | Discharge: 2022-11-08 | Disposition: A | Discharge status: Home / Self Care | Payer: Medicare Other | Source: Ambulatory Visit | Attending: Radiation Oncology | Admitting: Radiation Oncology

## 2022-11-08 DIAGNOSIS — C3412 Malignant neoplasm of upper lobe, left bronchus or lung: Secondary | ICD-10-CM | POA: Insufficient documentation

## 2022-11-08 MED ORDER — IOHEXOL 300 MG/ML  SOLN
75.0000 mL | Freq: Once | INTRAMUSCULAR | Status: AC | PRN
  Administered 2022-11-08: 75 mL via INTRAVENOUS

## 2022-11-15 ENCOUNTER — Encounter: Payer: Self-pay | Admitting: Radiation Oncology

## 2022-11-15 ENCOUNTER — Ambulatory Visit
Admission: RE | Admit: 2022-11-15 | Discharge: 2022-11-15 | Disposition: A | Discharge status: Home / Self Care | Payer: Medicare Other | Source: Ambulatory Visit | Attending: Radiation Oncology | Admitting: Radiation Oncology

## 2022-11-15 VITALS — BP 113/69 | HR 102 | Temp 97.6°F | Resp 22 | Ht 69.0 in | Wt 153.2 lb

## 2022-11-15 DIAGNOSIS — Z923 Personal history of irradiation: Secondary | ICD-10-CM | POA: Insufficient documentation

## 2022-11-15 DIAGNOSIS — C3431 Malignant neoplasm of lower lobe, right bronchus or lung: Secondary | ICD-10-CM | POA: Insufficient documentation

## 2022-11-15 DIAGNOSIS — C3412 Malignant neoplasm of upper lobe, left bronchus or lung: Secondary | ICD-10-CM

## 2022-11-15 NOTE — Progress Notes (Signed)
Radiation Oncology Follow up Note  Name: Steve Gregory   Date:   11/15/2022 MRN:  [id number] DOB: 1955/12/10    This 67 y.o. male presents to the clinic today for 46-month follow-up status post SBRT for stage I non-small cell lung cancer right lower lobe and previous patient previously treated back in 2019.  REFERRING PROVIDER: Kirk Ruths, MD  HPI: Patient is a 67 year old male now out for months having completed SBRT to his right lower lobe for stage I non-small cell lung cancer.  Seen today in routine follow-up he is stable specifically denies hemoptysis or chest tightness does have a slight nonproductive cough.  He had a recent CT scan of his chest.  Showing right lower lobe pulmonary nodule increased in size consistent with postradiation change.  Does have subcentimeter mediastinal lymph nodes are increased in size possibly reactive.  COMPLICATIONS OF TREATMENT: none  FOLLOW UP COMPLIANCE: keeps appointments   PHYSICAL EXAM:  BP 113/69   Pulse (!) 102   Temp 97.6 F (36.4 C)   Resp (!) 22   Ht 5\' 9"  (1.753 m)   Wt 153 lb 3.2 oz (69.5 kg)   SpO2 97%   BMI 22.62 kg/m  Well-developed well-nourished patient in NAD. HEENT reveals PERLA, EOMI, discs not visualized.  Oral cavity is clear. No oral mucosal lesions are identified. Neck is clear without evidence of cervical or supraclavicular adenopathy. Lungs are clear to A&P. Cardiac examination is essentially unremarkable with regular rate and rhythm without murmur rub or thrill. Abdomen is benign with no organomegaly or masses noted. Motor sensory and DTR levels are equal and symmetric in the upper and lower extremities. Cranial nerves II through XII are grossly intact. Proprioception is intact. No peripheral adenopathy or edema is identified. No motor or sensory levels are noted. Crude visual fields are within normal range.  RADIOLOGY RESULTS: CT scans reviewed compatible with above-stated findings  PLAN: Present time patient is  clinically doing well.  I have asked to see him back in 4 months with a repeat CT scan of the chest as requested by direct radiology.  Patient was to call sooner with any concerns should CT be stable we will go at 6 months for follow-up.  Patient comprehensive eye recommendations well.  I would like to take this opportunity to thank you for allowing me to participate in the care of your patient.Noreene Filbert, MD

## 2023-02-08 ENCOUNTER — Other Ambulatory Visit: Payer: Self-pay | Admitting: Specialist

## 2023-02-08 DIAGNOSIS — M79661 Pain in right lower leg: Secondary | ICD-10-CM

## 2023-02-14 ENCOUNTER — Ambulatory Visit
Admission: RE | Admit: 2023-02-14 | Discharge: 2023-02-14 | Disposition: A | Discharge status: Home / Self Care | Payer: Medicare Other | Source: Ambulatory Visit | Attending: Specialist | Admitting: Specialist

## 2023-02-14 DIAGNOSIS — M79661 Pain in right lower leg: Secondary | ICD-10-CM | POA: Insufficient documentation

## 2023-02-14 DIAGNOSIS — M79662 Pain in left lower leg: Secondary | ICD-10-CM | POA: Insufficient documentation

## 2023-02-24 ENCOUNTER — Inpatient Hospital Stay: Payer: Medicare Other

## 2023-02-24 ENCOUNTER — Emergency Department: Payer: Medicare Other

## 2023-02-24 ENCOUNTER — Inpatient Hospital Stay
Admission: EM | Admit: 2023-02-24 | Discharge: 2023-03-31 | DRG: 064 | Disposition: E | Payer: Medicare Other | Attending: Internal Medicine | Admitting: Internal Medicine

## 2023-02-24 ENCOUNTER — Other Ambulatory Visit: Payer: Self-pay

## 2023-02-24 DIAGNOSIS — J439 Emphysema, unspecified: Secondary | ICD-10-CM | POA: Diagnosis present

## 2023-02-24 DIAGNOSIS — R7881 Bacteremia: Secondary | ICD-10-CM | POA: Insufficient documentation

## 2023-02-24 DIAGNOSIS — Z85118 Personal history of other malignant neoplasm of bronchus and lung: Secondary | ICD-10-CM

## 2023-02-24 DIAGNOSIS — J690 Pneumonitis due to inhalation of food and vomit: Secondary | ICD-10-CM | POA: Diagnosis present

## 2023-02-24 DIAGNOSIS — R64 Cachexia: Secondary | ICD-10-CM | POA: Diagnosis present

## 2023-02-24 DIAGNOSIS — J189 Pneumonia, unspecified organism: Secondary | ICD-10-CM | POA: Diagnosis present

## 2023-02-24 DIAGNOSIS — I10 Essential (primary) hypertension: Secondary | ICD-10-CM | POA: Diagnosis present

## 2023-02-24 DIAGNOSIS — Z923 Personal history of irradiation: Secondary | ICD-10-CM

## 2023-02-24 DIAGNOSIS — R4701 Aphasia: Secondary | ICD-10-CM | POA: Diagnosis present

## 2023-02-24 DIAGNOSIS — D696 Thrombocytopenia, unspecified: Secondary | ICD-10-CM | POA: Diagnosis present

## 2023-02-24 DIAGNOSIS — Z86718 Personal history of other venous thrombosis and embolism: Secondary | ICD-10-CM

## 2023-02-24 DIAGNOSIS — R54 Age-related physical debility: Secondary | ICD-10-CM | POA: Diagnosis present

## 2023-02-24 DIAGNOSIS — Z66 Do not resuscitate: Secondary | ICD-10-CM | POA: Diagnosis not present

## 2023-02-24 DIAGNOSIS — Z6823 Body mass index (BMI) 23.0-23.9, adult: Secondary | ICD-10-CM

## 2023-02-24 DIAGNOSIS — Z1152 Encounter for screening for COVID-19: Secondary | ICD-10-CM | POA: Diagnosis not present

## 2023-02-24 DIAGNOSIS — F17210 Nicotine dependence, cigarettes, uncomplicated: Secondary | ICD-10-CM | POA: Diagnosis present

## 2023-02-24 DIAGNOSIS — D6859 Other primary thrombophilia: Secondary | ICD-10-CM | POA: Diagnosis present

## 2023-02-24 DIAGNOSIS — G8194 Hemiplegia, unspecified affecting left nondominant side: Secondary | ICD-10-CM | POA: Diagnosis present

## 2023-02-24 DIAGNOSIS — I63511 Cerebral infarction due to unspecified occlusion or stenosis of right middle cerebral artery: Secondary | ICD-10-CM | POA: Diagnosis present

## 2023-02-24 DIAGNOSIS — J440 Chronic obstructive pulmonary disease with acute lower respiratory infection: Secondary | ICD-10-CM | POA: Diagnosis present

## 2023-02-24 DIAGNOSIS — I2699 Other pulmonary embolism without acute cor pulmonale: Secondary | ICD-10-CM | POA: Diagnosis present

## 2023-02-24 DIAGNOSIS — J449 Chronic obstructive pulmonary disease, unspecified: Secondary | ICD-10-CM | POA: Diagnosis not present

## 2023-02-24 DIAGNOSIS — Z823 Family history of stroke: Secondary | ICD-10-CM

## 2023-02-24 DIAGNOSIS — I63549 Cerebral infarction due to unspecified occlusion or stenosis of unspecified cerebellar artery: Secondary | ICD-10-CM | POA: Diagnosis present

## 2023-02-24 DIAGNOSIS — F32A Depression, unspecified: Secondary | ICD-10-CM | POA: Diagnosis present

## 2023-02-24 DIAGNOSIS — I739 Peripheral vascular disease, unspecified: Secondary | ICD-10-CM | POA: Diagnosis present

## 2023-02-24 DIAGNOSIS — R6520 Severe sepsis without septic shock: Secondary | ICD-10-CM | POA: Diagnosis present

## 2023-02-24 DIAGNOSIS — Z515 Encounter for palliative care: Secondary | ICD-10-CM

## 2023-02-24 DIAGNOSIS — F419 Anxiety disorder, unspecified: Secondary | ICD-10-CM | POA: Diagnosis present

## 2023-02-24 DIAGNOSIS — H9190 Unspecified hearing loss, unspecified ear: Secondary | ICD-10-CM | POA: Diagnosis present

## 2023-02-24 DIAGNOSIS — D638 Anemia in other chronic diseases classified elsewhere: Secondary | ICD-10-CM | POA: Diagnosis present

## 2023-02-24 DIAGNOSIS — R4702 Dysphasia: Secondary | ICD-10-CM | POA: Diagnosis present

## 2023-02-24 DIAGNOSIS — G629 Polyneuropathy, unspecified: Secondary | ICD-10-CM

## 2023-02-24 DIAGNOSIS — I6521 Occlusion and stenosis of right carotid artery: Secondary | ICD-10-CM | POA: Diagnosis present

## 2023-02-24 DIAGNOSIS — J9601 Acute respiratory failure with hypoxia: Secondary | ICD-10-CM | POA: Diagnosis present

## 2023-02-24 DIAGNOSIS — I639 Cerebral infarction, unspecified: Secondary | ICD-10-CM | POA: Diagnosis present

## 2023-02-24 DIAGNOSIS — Z79899 Other long term (current) drug therapy: Secondary | ICD-10-CM

## 2023-02-24 DIAGNOSIS — Z83438 Family history of other disorder of lipoprotein metabolism and other lipidemia: Secondary | ICD-10-CM

## 2023-02-24 DIAGNOSIS — I63521 Cerebral infarction due to unspecified occlusion or stenosis of right anterior cerebral artery: Secondary | ICD-10-CM | POA: Diagnosis present

## 2023-02-24 DIAGNOSIS — J432 Centrilobular emphysema: Secondary | ICD-10-CM | POA: Diagnosis not present

## 2023-02-24 DIAGNOSIS — K219 Gastro-esophageal reflux disease without esophagitis: Secondary | ICD-10-CM | POA: Diagnosis not present

## 2023-02-24 DIAGNOSIS — R29733 NIHSS score 33: Secondary | ICD-10-CM | POA: Diagnosis present

## 2023-02-24 DIAGNOSIS — R471 Dysarthria and anarthria: Secondary | ICD-10-CM | POA: Diagnosis present

## 2023-02-24 DIAGNOSIS — K2270 Barrett's esophagus without dysplasia: Secondary | ICD-10-CM | POA: Diagnosis present

## 2023-02-24 DIAGNOSIS — I2693 Single subsegmental pulmonary embolism without acute cor pulmonale: Secondary | ICD-10-CM | POA: Diagnosis not present

## 2023-02-24 DIAGNOSIS — I6389 Other cerebral infarction: Secondary | ICD-10-CM | POA: Diagnosis not present

## 2023-02-24 DIAGNOSIS — I619 Nontraumatic intracerebral hemorrhage, unspecified: Secondary | ICD-10-CM | POA: Diagnosis present

## 2023-02-24 DIAGNOSIS — Z85828 Personal history of other malignant neoplasm of skin: Secondary | ICD-10-CM

## 2023-02-24 DIAGNOSIS — Z888 Allergy status to other drugs, medicaments and biological substances status: Secondary | ICD-10-CM

## 2023-02-24 DIAGNOSIS — R29810 Facial weakness: Secondary | ICD-10-CM | POA: Diagnosis present

## 2023-02-24 DIAGNOSIS — G936 Cerebral edema: Secondary | ICD-10-CM | POA: Diagnosis not present

## 2023-02-24 DIAGNOSIS — R4182 Altered mental status, unspecified: Secondary | ICD-10-CM | POA: Diagnosis present

## 2023-02-24 DIAGNOSIS — R0682 Tachypnea, not elsewhere classified: Secondary | ICD-10-CM | POA: Diagnosis present

## 2023-02-24 DIAGNOSIS — Z7189 Other specified counseling: Secondary | ICD-10-CM | POA: Diagnosis not present

## 2023-02-24 DIAGNOSIS — A419 Sepsis, unspecified organism: Secondary | ICD-10-CM | POA: Diagnosis present

## 2023-02-24 DIAGNOSIS — Z7951 Long term (current) use of inhaled steroids: Secondary | ICD-10-CM

## 2023-02-24 DIAGNOSIS — R1310 Dysphagia, unspecified: Secondary | ICD-10-CM | POA: Diagnosis present

## 2023-02-24 DIAGNOSIS — Z7901 Long term (current) use of anticoagulants: Secondary | ICD-10-CM

## 2023-02-24 DIAGNOSIS — I63512 Cerebral infarction due to unspecified occlusion or stenosis of left middle cerebral artery: Secondary | ICD-10-CM | POA: Diagnosis present

## 2023-02-24 DIAGNOSIS — Z8249 Family history of ischemic heart disease and other diseases of the circulatory system: Secondary | ICD-10-CM

## 2023-02-24 DIAGNOSIS — Z886 Allergy status to analgesic agent status: Secondary | ICD-10-CM

## 2023-02-24 HISTORY — DX: Acute embolism and thrombosis of unspecified deep veins of unspecified lower extremity: I82.409

## 2023-02-24 LAB — URINALYSIS, COMPLETE (UACMP) WITH MICROSCOPIC
Bilirubin Urine: NEGATIVE
Glucose, UA: NEGATIVE mg/dL
Ketones, ur: 20 mg/dL — AB
Leukocytes,Ua: NEGATIVE
Nitrite: NEGATIVE
Protein, ur: 30 mg/dL — AB
Specific Gravity, Urine: 1.024 (ref 1.005–1.030)
Squamous Epithelial / HPF: NONE SEEN /HPF (ref 0–5)
pH: 5 (ref 5.0–8.0)

## 2023-02-24 LAB — COMPREHENSIVE METABOLIC PANEL
ALT: 16 U/L (ref 0–44)
AST: 29 U/L (ref 15–41)
Albumin: 3.4 g/dL — ABNORMAL LOW (ref 3.5–5.0)
Alkaline Phosphatase: 81 U/L (ref 38–126)
Anion gap: 15 (ref 5–15)
BUN: 20 mg/dL (ref 8–23)
CO2: 23 mmol/L (ref 22–32)
Calcium: 9 mg/dL (ref 8.9–10.3)
Chloride: 102 mmol/L (ref 98–111)
Creatinine, Ser: 1.13 mg/dL (ref 0.61–1.24)
GFR, Estimated: >60 mL/min (ref >60)
Glucose, Bld: 88 mg/dL (ref 70–99)
Potassium: 3.9 mmol/L (ref 3.5–5.1)
Sodium: 140 mmol/L (ref 135–145)
Total Bilirubin: 1.3 mg/dL — ABNORMAL HIGH (ref 0.3–1.2)
Total Protein: 6.8 g/dL (ref 6.5–8.1)

## 2023-02-24 LAB — CBC WITH DIFFERENTIAL/PLATELET
Abs Immature Granulocytes: 0.11 10*3/uL — ABNORMAL HIGH (ref 0.00–0.07)
Basophils Absolute: 0.1 10*3/uL (ref 0.0–0.1)
Basophils Relative: 1 %
Eosinophils Absolute: 0.1 10*3/uL (ref 0.0–0.5)
Eosinophils Relative: 1 %
HCT: 37.8 % — ABNORMAL LOW (ref 39.0–52.0)
Hemoglobin: 12.2 g/dL — ABNORMAL LOW (ref 13.0–17.0)
Immature Granulocytes: 1 %
Lymphocytes Relative: 10 %
Lymphs Abs: 1.7 10*3/uL (ref 0.7–4.0)
MCH: 29.3 pg (ref 26.0–34.0)
MCHC: 32.3 g/dL (ref 30.0–36.0)
MCV: 90.6 fL (ref 80.0–100.0)
Monocytes Absolute: 1.3 10*3/uL — ABNORMAL HIGH (ref 0.1–1.0)
Monocytes Relative: 8 %
Neutro Abs: 13.7 10*3/uL — ABNORMAL HIGH (ref 1.7–7.7)
Neutrophils Relative %: 79 %
Platelets: 140 10*3/uL — ABNORMAL LOW (ref 150–400)
RBC: 4.17 MIL/uL — ABNORMAL LOW (ref 4.22–5.81)
RDW: 13.5 % (ref 11.5–15.5)
WBC: 16.9 10*3/uL — ABNORMAL HIGH (ref 4.0–10.5)
nRBC: 0 % (ref 0.0–0.2)

## 2023-02-24 LAB — LACTIC ACID, PLASMA
Lactic Acid, Venous: 1.1 mmol/L (ref 0.5–1.9)
Lactic Acid, Venous: 2.7 mmol/L (ref 0.5–1.9)

## 2023-02-24 LAB — BLOOD GAS, ARTERIAL
Acid-Base Excess: 2.9 mmol/L — ABNORMAL HIGH (ref 0.0–2.0)
Bicarbonate: 25.9 mmol/L (ref 20.0–28.0)
O2 Content: 4 L/min
O2 Saturation: 96.3 %
Patient temperature: 37
pCO2 arterial: 34 mmHg (ref 32–48)
pH, Arterial: 7.49 — ABNORMAL HIGH (ref 7.35–7.45)
pO2, Arterial: 68 mmHg — ABNORMAL LOW (ref 83–108)

## 2023-02-24 LAB — CK: Total CK: 370 U/L (ref 49–397)

## 2023-02-24 LAB — PROTIME-INR
INR: 1.4 — ABNORMAL HIGH (ref 0.8–1.2)
Prothrombin Time: 17.4 seconds — ABNORMAL HIGH (ref 11.4–15.2)

## 2023-02-24 LAB — APTT: aPTT: 33 seconds (ref 24–36)

## 2023-02-24 LAB — SARS CORONAVIRUS 2 BY RT PCR: SARS Coronavirus 2 by RT PCR: NEGATIVE

## 2023-02-24 MED ORDER — ALBUTEROL SULFATE (2.5 MG/3ML) 0.083% IN NEBU
3.0000 mL | INHALATION_SOLUTION | Freq: Four times a day (QID) | RESPIRATORY_TRACT | Status: DC | PRN
  Administered 2023-02-24 – 2023-03-02 (×7): 3 mL via RESPIRATORY_TRACT
  Filled 2023-02-24 (×8): qty 3

## 2023-02-24 MED ORDER — SODIUM CHLORIDE 0.9 % IV SOLN
2.0000 g | INTRAVENOUS | Status: DC
  Administered 2023-02-25 – 2023-02-26 (×2): 2 g via INTRAVENOUS
  Filled 2023-02-24 (×3): qty 20

## 2023-02-24 MED ORDER — THEOPHYLLINE ER 400 MG PO TB24
400.0000 mg | ORAL_TABLET | Freq: Every day | ORAL | Status: DC
  Filled 2023-02-24 (×7): qty 1

## 2023-02-24 MED ORDER — LOSARTAN POTASSIUM 50 MG PO TABS: 50.0000 mg | ORAL_TABLET | Freq: Every day | ORAL | Status: DC

## 2023-02-24 MED ORDER — ACETAMINOPHEN 325 MG PO TABS: 650.0000 mg | ORAL_TABLET | ORAL | Status: DC | PRN

## 2023-02-24 MED ORDER — TRAZODONE HCL 50 MG PO TABS: 25.0000 mg | ORAL_TABLET | Freq: Every evening | ORAL | Status: DC | PRN

## 2023-02-24 MED ORDER — MAGNESIUM HYDROXIDE 400 MG/5ML PO SUSP: 30.0000 mL | Freq: Every day | ORAL | Status: DC | PRN

## 2023-02-24 MED ORDER — PANTOPRAZOLE SODIUM 40 MG PO TBEC: 40.0000 mg | DELAYED_RELEASE_TABLET | Freq: Two times a day (BID) | ORAL | Status: DC

## 2023-02-24 MED ORDER — SODIUM CHLORIDE 0.9 % IV SOLN
500.0000 mg | INTRAVENOUS | Status: AC
  Administered 2023-02-24 – 2023-03-01 (×5): 500 mg via INTRAVENOUS
  Filled 2023-02-24 (×6): qty 5

## 2023-02-24 MED ORDER — LACTATED RINGERS IV BOLUS (SEPSIS)
1000.0000 mL | Freq: Once | INTRAVENOUS | Status: AC
  Administered 2023-02-24: 1000 mL via INTRAVENOUS

## 2023-02-24 MED ORDER — IOHEXOL 350 MG/ML SOLN
75.0000 mL | Freq: Once | INTRAVENOUS | Status: AC | PRN
  Administered 2023-02-24: 75 mL via INTRAVENOUS

## 2023-02-24 MED ORDER — SENNOSIDES-DOCUSATE SODIUM 8.6-50 MG PO TABS: 1.0000 | ORAL_TABLET | Freq: Every evening | ORAL | Status: DC | PRN

## 2023-02-24 MED ORDER — ACETAMINOPHEN 160 MG/5ML PO SOLN: 650.0000 mg | ORAL | Status: DC | PRN

## 2023-02-24 MED ORDER — VANCOMYCIN HCL IN DEXTROSE 1-5 GM/200ML-% IV SOLN
1000.0000 mg | Freq: Once | INTRAVENOUS | Status: AC
  Administered 2023-02-24: 1000 mg via INTRAVENOUS
  Filled 2023-02-24: qty 200

## 2023-02-24 MED ORDER — STROKE: EARLY STAGES OF RECOVERY BOOK: Freq: Once | Status: AC

## 2023-02-24 MED ORDER — MONTELUKAST SODIUM 10 MG PO TABS: 10.0000 mg | ORAL_TABLET | Freq: Every day | ORAL | Status: DC

## 2023-02-24 MED ORDER — ONDANSETRON HCL 4 MG/2ML IJ SOLN: 4.0000 mg | INTRAMUSCULAR | Status: DC | PRN

## 2023-02-24 MED ORDER — SODIUM CHLORIDE 0.9 % IV SOLN
2.0000 g | Freq: Once | INTRAVENOUS | Status: AC
  Administered 2023-02-24: 2 g via INTRAVENOUS
  Filled 2023-02-24: qty 12.5

## 2023-02-24 MED ORDER — SODIUM CHLORIDE 0.9 % IV SOLN: INTRAVENOUS | Status: DC

## 2023-02-24 MED ORDER — ACETAMINOPHEN 325 MG RE SUPP: 650.0000 mg | RECTAL | Status: DC | PRN

## 2023-02-24 MED ORDER — MIRTAZAPINE 15 MG PO TABS: 7.5000 mg | ORAL_TABLET | Freq: Every day | ORAL | Status: DC

## 2023-02-24 MED ORDER — ACETAMINOPHEN 650 MG RE SUPP
650.0000 mg | RECTAL | Status: DC | PRN
  Administered 2023-02-24 – 2023-02-26 (×2): 650 mg via RECTAL
  Filled 2023-02-24: qty 1
  Filled 2023-02-24: qty 2

## 2023-02-24 MED ORDER — FLUTICASONE PROPIONATE 50 MCG/ACT NA SUSP: 2.0000 | Freq: Every day | NASAL | Status: DC | PRN

## 2023-02-24 MED ORDER — ALBUTEROL SULFATE (2.5 MG/3ML) 0.083% IN NEBU: 2.5000 mg | INHALATION_SOLUTION | Freq: Four times a day (QID) | RESPIRATORY_TRACT | Status: DC | PRN

## 2023-02-24 MED ORDER — ENOXAPARIN SODIUM 40 MG/0.4ML IJ SOSY
40.0000 mg | PREFILLED_SYRINGE | INTRAMUSCULAR | Status: DC
  Administered 2023-02-24: 40 mg via SUBCUTANEOUS
  Filled 2023-02-24: qty 0.4

## 2023-02-24 NOTE — ED Provider Notes (Signed)
[facility] Provider Note    Event Date/Time   First MD Initiated Contact with Patient 02/09/2023 1800     (approximate)   History   Altered mental status   HPI  Steve Gregory is a 67 y.o. male who presents to the emergency department today because of concerns for altered mental status and weakness.  Patient is unable to give any history.  History is obtained via EMS and further history obtained by brother at bedside.  Apparently 2 days ago was the last time family had spoken with patient.  This was over the telephone.  He was at a gas station and complained of vision change.  Apparently he was able to make at home.  Today when family went to check on him they found him on the ground with altered mental status.     Physical Exam   Triage Vital Signs: ED Triage Vitals [02/02/2023 1805]  Enc Vitals Group     BP (!) 146/71     Pulse Rate (!) 104     Resp (!) 24     Temp      Temp src      SpO2 92 %     Weight      Height      Head Circumference      Peak Flow      Pain Score      Pain Loc      Pain Edu?      Excl. in GC?     Most recent vital signs: Vitals:   02/06/2023 1805  BP: (!) 146/71  Pulse: (!) 104  Resp: (!) 24  SpO2: 92%   General: Awake, alert. CV:  Good peripheral perfusion. Tachycardia. Resp:  Normal effort. Lungs clear. Abd:  No distention.  Other:  Right gaze. Severely decreased strength in left upper and lower extremities.    ED Results / Procedures / Treatments   Labs (all labs ordered are listed, but only abnormal results are displayed) Labs Reviewed  COMPREHENSIVE METABOLIC PANEL - Abnormal; Notable for the following components:      Result Value   Albumin 3.4 (*)    Total Bilirubin 1.3 (*)    All other components within normal limits  CBC WITH DIFFERENTIAL/PLATELET - Abnormal; Notable for the following components:   WBC 16.9 (*)    RBC 4.17 (*)    Hemoglobin 12.2 (*)    HCT 37.8 (*)    Platelets 140 (*)     Neutro Abs 13.7 (*)    Monocytes Absolute 1.3 (*)    Abs Immature Granulocytes 0.11 (*)    All other components within normal limits  PROTIME-INR - Abnormal; Notable for the following components:   Prothrombin Time 17.4 (*)    INR 1.4 (*)    All other components within normal limits  CULTURE, BLOOD (ROUTINE X 2)  CULTURE, BLOOD (ROUTINE X 2)  LACTIC ACID, PLASMA  APTT  CK  LACTIC ACID, PLASMA  URINALYSIS, COMPLETE (UACMP) WITH MICROSCOPIC     EKG  I, Phineas Semen, attending physician, personally viewed and interpreted this EKG  EKG Time: 1802 Rate: 103 Rhythm: sinus tachycardia Axis: left axis deviation Intervals: qtc 448 QRS: q waves III, aVF ST changes: no st elevation Impression: abnormal ekg    RADIOLOGY I independently interpreted and visualized the CXR. My interpretation: right sided pneumonia Radiology interpretation:  IMPRESSION:  Stable radiation changes involving both upper lobes. No definite  acute abnormality is  noted.    Emphysema (ICD10-J43.9).   I independently interpreted and visualized the ct head. My interpretation: right sided stroke Radiology interpretation:  IMPRESSION:  1. Findings concerning for large right MCA territory acute infarct.  No hemorrhage or mass effect.  2. New areas of hypodensity in the left cerebellum concerning for  age-indeterminate lacunar infarcts.       PROCEDURES:  Critical Care performed: Yes  CRITICAL CARE Performed by: Phineas Semen   Total critical care time: 30 minutes  Critical care time was exclusive of separately billable procedures and treating other patients.  Critical care was necessary to treat or prevent imminent or life-threatening deterioration.  Critical care was time spent personally by me on the following activities: development of treatment plan with patient and/or surrogate as well as nursing, discussions with consultants, evaluation of patient's response to treatment, examination  of patient, obtaining history from patient or surrogate, ordering and performing treatments and interventions, ordering and review of laboratory studies, ordering and review of radiographic studies, pulse oximetry and re-evaluation of patient's condition.   Procedures    MEDICATIONS ORDERED IN ED: Medications - No data to display   IMPRESSION / MDM / ASSESSMENT AND PLAN / ED COURSE  I reviewed the triage vital signs and the nursing notes.                              Differential diagnosis includes, but is not limited to, CVA, ICH, pneumonia  Patient's presentation is most consistent with acute presentation with potential threat to life or bodily function.   The patient is on the cardiac monitor to evaluate for evidence of arrhythmia and/or significant heart rate changes.  Patient presents to the emergency department today because of concerns for altered mental status.  On exam patient has significant left sided weakness.  Additionally was slightly tachycardic and tachypneic.  Did have concerns for possible stroke as well as infection.  CT scan of the head was concerning for large right-sided stroke.  Did discuss with Dr. Jerrell Belfast with neurology.  At this time patient is out of the window for intervention.  Additionally blood work with leukocytosis.  Patient was started on multiple broad-spectrum IV antibiotics.  Discussed with Dr. Arville Care with the hospitalist service who will plan on admission.      FINAL CLINICAL IMPRESSION(S) / ED DIAGNOSES   Final diagnoses:  Cerebrovascular accident (CVA), unspecified mechanism (HCC)    Note:  This document was prepared using Dragon voice recognition software and may include unintentional dictation errors.    Phineas Semen, MD March 05, 2023 [phone]

## 2023-02-24 NOTE — H&P (Addendum)
[place]   PATIENT NAME: Steve Gregory    MR#:  [id number]  DATE OF BIRTH:  06-03-1956  DATE OF ADMISSION:  02/08/2023  PRIMARY CARE PHYSICIAN: Lauro Regulus, MD   Patient is coming from: Home  REQUESTING/REFERRING PHYSICIAN: Phineas Semen, MD  CHIEF COMPLAINT:   Chief Complaint  Patient presents with   Weakness    HISTORY OF PRESENT ILLNESS:  Steve Gregory is a 67 y.o. Caucasian male with medical history significant for anxiety, depression, GERD, essential hypertension, emphysema, peripheral vascular disease, Barrett's esophagus, anxiety and osteoarthritis, who presented to the emergency room with acute onset of left-sided weakness with expressive dysphasia of questionable onset.  The patient's brother last talked to him on Tuesday.  He was expecting to see him at his mother's house yesterday but he could not make it.  Today he called him with no answer and therefore he went to his house.  When he did not answer he called the sheriff.  The patient's nephew got into the house as he is a IT sales professional and found the patient laying on his bed with his underwear staring at the ceiling.  He noted he was having left-sided weakness more on the arm than the leg and was not talking at his usual.  He was mostly answering with yes and no for questions.  The patient's brother stated that on Tuesday he told him he is eyes were crossed and he had double vision.  He was recently diagnosed with left lower extremity DVT and placed on Eliquis.  No reported fever or chills.  No nausea or vomiting or abdominal pain.  No chest pain or palpitations.  No cough or wheezing or hemoptysis.  No other bleeding diathesis.  ED Course: When the patient came to the ER, BP was 146/71 with a heart rate of 102 and respiratory rate of 28 and pulse oximetry 93% on room air and 96 to 97% on 2 L of O2 by nasal cannula.  CMP revealed albumin of 3.4 and was otherwise unremarkable, total CK was 370, lactic acid was  1.1 and later 2.7.  CBC showed leukocytosis 16.9 with neutrophilia and mild thrombocytopenia 140 as well as anemia.  INR was 1.4 and PT 17.4 and PTT 33.  COVID-19 PCR is pending.  UA showed 6-10 RBCs and 0-5 WBCs with 30 protein and 20 ketones. EKG as reviewed by me : EKG showed sinus tachycardia with a rate of 103 and old inferior infarction Imaging: Noncontrasted head CT scan showed large right MCA territory acute infarction with no hemorrhage or mass effect.  There were new areas of hypodensity in the left cerebellum concerning for age-indeterminate lacunar infarcts. Chest CTA showed the following: 1. 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 Distal reconstitution via the ophthalmic artery. Right MCA and its distal branches are attenuated but remain patent. 2. Focal filling defects involving the anterior communicating artery complex and proximal right A2 segment, likely subocclusive thrombus. Right ACA is attenuated but patent distally. 3. Large evolving right MCA/ACA distribution infarct. 4. 6 mm left MCA bifurcation aneurysm. 5. Severe emphysema with irregular post treatment changes within the left upper lobe, similar to prior CT. Superimposed hazy and patchy opacities favored to reflect edema and/or atelectasis. 6. Enlarged mediastinal and right hilar adenopathy as above as well as aortic atherosclerosis and emphysema..   The patient was given 1 L bolus of IV lactated Ringer, IV cefepime and vancomycin.  Dr. Wilford Corner  was notified about the patient.  The patient will be admitted to a progressive unit bed for further evaluation and management. PAST MEDICAL HISTORY:   Past Medical History:  Diagnosis Date   Allergy    Anxiety    Arthritis    Barrett's esophagus    Bronchitis, chronic (HCC)    Cancer (HCC)    LUNG   COPD (chronic obstructive pulmonary disease) (HCC)    Cough    CHRONIC   Depression    Dizziness    DVT (deep venous thrombosis) (HCC)     Dysphagia    Essential hypertension 06/28/2017   GERD (gastroesophageal reflux disease)    H/O emphysema (HCC)    Headache    History of depression 06/05/2014   Last Assessment & Plan:  Mood is doing well on medications.    History of hiatal hernia    Hoarseness of voice    partial paralyzed vocal cord   HOH (hard of hearing)    Hypertension    Lung mass    Maxillary fracture (HCC)    LEFT SIDE   Orthopnea    Oxygen decrease    H/O HOME USE, NOT NOW   Personal history of colonic polyps    Pneumonia    PSEUDOMONAL PNEUMONIA IN THE PAST   PVD (peripheral vascular disease) (HCC) 06/05/2014   Overview:  Smoker with poor pulses  Last Assessment & Plan:  No leg pain is noted of late.    Renal failure    PRERENAL   Seizures (HCC)    sounds like it could be vagal at times, coughing can cause him to fade out and see white lights. had last sz 3 months ago when not taking meds   Shortness of breath dyspnea    uses many different inhalers   Squamous cell carcinoma of skin 01/07/2020   right mid volar forearm, mid back spinal   Varicose veins of both lower extremities with pain 06/28/2017   Wheezing     PAST SURGICAL HISTORY:   Past Surgical History:  Procedure Laterality Date   CATARACT EXTRACTION W/PHACO Right 10/27/2015   Procedure: CATARACT EXTRACTION PHACO AND INTRAOCULAR LENS PLACEMENT (IOC) suture placed in right eye at end of procedure;  Surgeon: Sallee Lange, MD;  Location: [facility] ORS;  Service: Ophthalmology;  Laterality: Right;  [place] 01:02 AP% 24.4 CDE 28.12 fluid pack lot # [id number]   CATARACT EXTRACTION W/PHACO Left 11/17/2015   Procedure: CATARACT EXTRACTION PHACO AND INTRAOCULAR LENS PLACEMENT (IOC);  Surgeon: Sallee Lange, MD;  Location: [facility] ORS;  Service: Ophthalmology;  Laterality: Left;  [place] 01:29 AP% 24.3 CDE 40.05 fluid pack lot # [id number]   COLONOSCOPY WITH PROPOFOL N/A 07/28/2017   Procedure: COLONOSCOPY WITH PROPOFOL;  Surgeon: Christena Deem, MD;   Location[facility] END[facility];  Service: Endoscopy;  Laterality: N/A;   COLONOSCOPY WITH PROPOFOL N/A 06/09/2020   Procedure: COLONOSCOPY WITH PROPOFOL;  Surgeon: Regis Bill, MD;  Location: [facility] ENDOSCOPY;  Service: Endoscopy;  Laterality: N/A;   ESOPHAGOGASTRODUODENOSCOPY (EGD) WITH PROPOFOL N/A 06/06/2017   Procedure: ESOPHAGOGASTRODUODENOSCOPY (EGD) WITH PROPOFOL;  Surgeon: Christena Deem, MD;  Location: [facility] ENDOSCOPY;  Service: Endoscopy;  Laterality: N/A;   ESOPHAGOGASTRODUODENOSCOPY (EGD) WITH PROPOFOL N/A 06/09/2020   Procedure: ESOPHAGOGASTRODUODENOSCOPY (EGD) WITH PROPOFOL;  Surgeon: Regis Bill, MD;  Location: [facility] ENDOSCOPY;  Service: Endoscopy;  Laterality: N/A;   ESOPHAGOGASTRODUODENOSCOPY (EGD) WITH PROPOFOL N/A 04/06/2022   Procedure: ESOPHAGOGASTRODUODENOSCOPY (EGD) WITH PROPOFOL;  Surgeon: Regis Bill, MD;  Location: [facility]  ENDOSCOPY;  Service: Endoscopy;  Laterality: N/A;   EYE SURGERY     FACIAL RECONSTRUCTION SURGERY  1980   LEFT CHEEK BONE INJURY 1980    SOCIAL HISTORY:   Social History   Tobacco Use   Smoking status: Every Day    Packs/day: 1.00    Years: 43.00    Additional pack years: 0.00    Total pack years: 43.00    Types: Cigarettes   Smokeless tobacco: Never  Substance Use Topics   Alcohol use: No    FAMILY HISTORY:   Family History  Problem Relation Age of Onset   Hyperlipidemia Mother    Stroke Father    Aneurysm Father    Heart disease Brother    Hypertension Brother     DRUG ALLERGIES:   Allergies  Allergen Reactions   Chantix [Varenicline] Swelling        Lipitor [Atorvastatin] Swelling   Mobic [Meloxicam] Other (See Comments)    Myalgia   Statins Other (See Comments)    Myalgia    REVIEW OF SYSTEMS:   ROS As per history of present illness. All pertinent systems were reviewed above. Constitutional, HEENT, cardiovascular, respiratory, GI, GU, musculoskeletal, neuro, psychiatric, endocrine, integumentary and  hematologic systems were reviewed and are otherwise negative/unremarkable except for positive findings mentioned above in the HPI.   MEDICATIONS AT HOME:   Prior to Admission medications   Medication Sig Start Date End Date Taking? Authorizing Provider  albuterol (PROVENTIL) (2.5 MG/3ML) 0.083% nebulizer solution Take 2.5 mg by nebulization every 6 (six) hours as needed for wheezing or shortness of breath.    [provider]  DULoxetine (CYMBALTA) 60 MG capsule Take 60 mg by mouth daily. Patient not taking: No sig reported 07/16/20   [provider]  famotidine (PEPCID) 20 MG tablet Take 20 mg by mouth 2 (two) times daily. Patient not taking: No sig reported 04/09/19   [provider]  fluticasone (FLONASE) 50 MCG/ACT nasal spray Place 2 sprays into both nostrils daily.    [provider]  fluticasone-salmeterol (ADVAIR HFA) 115-21 MCG/ACT inhaler Inhale 2 puffs into the lungs 2 (two) times daily.    [provider]  losartan (COZAAR) 50 MG tablet Take 50 mg by mouth daily.     [provider]  mirtazapine (REMERON) 7.5 MG tablet Take by mouth. 07/20/22 07/20/23  [provider]  montelukast (SINGULAIR) 10 MG tablet Take 10 mg by mouth daily.     [provider]  pantoprazole (PROTONIX) 40 MG tablet Take 40 mg by mouth 2 (two) times daily before a meal.    [provider]  predniSONE (DELTASONE) 10 MG tablet Take 50 mg daily--taper by 10 mg daily thereafter resume your chronic daily prednisone dose 11/13/19   Enedina Finner, MD  sucralfate (CARAFATE) 1 g tablet Take 1 g by mouth 2 (two) times daily. Patient not taking: Reported on 01/21/2021 01/07/21   [provider]  SYMBICORT 160-4.5 MCG/ACT inhaler Inhale into the lungs. Patient not taking: No sig reported 07/04/20   [provider]  theophylline (UNIPHYL) 400 MG 24 hr tablet Take 400 mg by mouth daily.    [provider]  VENTOLIN HFA 108  (90 Base) MCG/ACT inhaler Inhale 2 puffs into the lungs every 6 (six) hours as needed for wheezing or shortness of breath.     [provider]      VITAL SIGNS:  Blood pressure (!) 140/65, pulse 96, temperature 100.1 F (37.8 C),  temperature source Oral, resp. rate (!) 30, height 5\' 9"  (1.753 m), weight 71.9 kg, SpO2 93 %.  PHYSICAL EXAMINATION:  Physical Exam  GENERAL:  67 y.o.-year-old Caucasian male patient lying in the bed with no acute distress.  EYES: Pupils equal, round, reactive to light and accommodation. No scleral icterus. Extraocular muscles intact.  HEENT: Head atraumatic, normocephalic. Oropharynx and nasopharynx clear.  NECK:  Supple, no jugular venous distention. No thyroid enlargement, no tenderness.  LUNGS: Normal breath sounds bilaterally, no wheezing, rales,rhonchi or crepitation. No use of accessory muscles of respiration.  CARDIOVASCULAR: Regular rate and rhythm, S1, S2 normal. No murmurs, rubs, or gallops.  ABDOMEN: Soft, nondistended, nontender. Bowel sounds present. No organomegaly or mass.  EXTREMITIES: No pedal edema, cyanosis, or clubbing.  NEUROLOGIC: Cranial nerves II through XII revealed right gaze preference and expressive dysphasia with associated dysarthria and left facial droop.  Muscle strength 0-1 /5 in left upper extremity and 1-2/5 in the left lower extremity compared to 4-5/5 in the right upper and lower extremities. Sensation intact. Gait not checked.  PSYCHIATRIC: The patient is alert and oriented x 3.  Normal affect and good eye contact. SKIN: No obvious rash, lesion, or ulcer.   LABORATORY PANEL:   CBC Recent Labs  Lab 01/31/2023 1814  WBC 16.9*  HGB 12.2*  HCT 37.8*  PLT 140*   ------------------------------------------------------------------------------------------------------------------  Chemistries  Recent Labs  Lab 02/14/2023 1814  NA 140  K 3.9  CL 102  CO2 23  GLUCOSE 88  BUN 20  CREATININE 1.13  CALCIUM 9.0   AST 29  ALT 16  ALKPHOS 81  BILITOT 1.3*   ------------------------------------------------------------------------------------------------------------------  Cardiac Enzymes No results for input(s): "TROPONINI" in the last 168 hours. ------------------------------------------------------------------------------------------------------------------  RADIOLOGY:  MR BRAIN WO CONTRAST  Result Date: 02/09/2023 CLINICAL DATA:  Initial evaluation for neuro deficit, stroke. EXAM: MRI HEAD WITHOUT CONTRAST TECHNIQUE: Multiplanar, multiecho pulse sequences of the brain and surrounding structures were obtained without intravenous contrast. COMPARISON:  Prior studies from earlier the same day. FINDINGS: Brain: Mild age-related cerebral atrophy. Patchy T2/FLAIR hyperintensity involving the supratentorial cerebral white matter, consistent with chronic small vessel ischemic disease, moderately advanced. Patchy and confluent restricted diffusion involving the right cerebral hemisphere, right ACA and MCA distributions, consistent with large evolving acute ischemic infarcts. Additional patchy small volume ischemic infarcts noted involving the contralateral left cerebral hemisphere. Patchy involvement of the right basal ganglia and left thalamus. Scattered acute ischemic infarcts seen involving the cerebellar hemispheres bilaterally. No associated hemorrhage or mass effect. Gray-white matter differentiation otherwise maintained. No other areas of chronic cortical infarction. No acute or chronic intracranial blood products. No mass lesion, midline shift or mass effect. No hydrocephalus or extra-axial fluid collection. Pituitary gland suprasellar region within normal limits. Vascular: Loss of normal flow void within the right ICA to the siphon, consistent with previously identified occlusion. Major intracranial vascular flow voids are otherwise maintained. Skull and upper cervical spine: Craniocervical junction within  normal limits. Bone marrow signal intensity normal. No scalp soft tissue abnormality. Sinuses/Orbits: Prior bilateral ocular lens replacement. Mild chronic right maxillary sinusitis. Trace right mastoid effusion, of doubtful significance. Other: None. IMPRESSION: 1. Large evolving right ACA and MCA distribution infarcts. Additional patchy smaller volume ischemic infarcts involving the contralateral left cerebral hemisphere, left thalamus, and bilateral cerebellar hemispheres. No associated hemorrhage or mass effect. 2. Loss of normal flow void within the right ICA to the siphon, consistent with previously identified occlusion. 3. Underlying age-related cerebral atrophy with moderate chronic small  vessel ischemic disease. Electronically Signed   By: Rise Mu M.D.   On: 02/23/2023 21:42   CT ANGIO HEAD NECK W WO CM  Result Date: 02/08/2023 CLINICAL DATA:  Initial evaluation for neuro deficit, stroke. EXAM: CT ANGIOGRAPHY HEAD AND NECK WITH AND WITHOUT CONTRAST TECHNIQUE: Multidetector CT imaging of the head and neck was performed using the standard protocol during bolus administration of intravenous contrast. Multiplanar CT image reconstructions and MIPs were obtained to evaluate the vascular anatomy. Carotid stenosis measurements (when applicable) are obtained utilizing NASCET criteria, using the distal internal carotid diameter as the denominator. RADIATION DOSE REDUCTION: This exam was performed according to the departmental dose-optimization program which includes automated exposure control, adjustment of the mA and/or kV according to patient size and/or use of iterative reconstruction technique. CONTRAST:  75mL OMNIPAQUE IOHEXOL 350 MG/ML SOLN COMPARISON:  Prior CT from earlier the same day. FINDINGS: CTA NECK FINDINGS Aortic arch: Visualized aortic arch normal in caliber with normal branch pattern. Mild atheromatous change about the arch itself. No stenosis about the origin the great vessels.  Right carotid system: Right common carotid artery patent to the bifurcation without significant stenosis. There is occlusion of the right ICA just distal to the bifurcation, likely acute (series 4, image 101). Right ICA remains occluded within the neck. Left carotid system: Left common and internal carotid arteries are patent without dissection. Mild atheromatous change about the left carotid artery system without hemodynamically significant greater than 50% stenosis. Vertebral arteries: Both vertebral arteries arise from subclavian arteries. No proximal subclavian artery stenosis. Left vertebral artery dominant. Vertebral arteries are patent without stenosis or dissection. Skeleton: No discrete or worrisome osseous lesions. Other neck: No other acute soft tissue abnormality within the neck. Upper chest: Severe emphysema. Irregular post treatment changes within the left upper lobe, similar to prior CT. Superimposed hazy and patchy opacities favored to reflect edema and/or atelectasis. Enlarged 1.7 cm right pretracheal node (series 4, image 177) 1.3 cm subcarinal node (series 4, image 196). 1.3 cm right hilar node ((series 4, image 192). Review of the MIP images confirms the above findings CTA HEAD FINDINGS Anterior circulation: Scattered atheromatous change within the left carotid siphon without hemodynamically significant stenosis. Distal reconstitution of the right ICA via the ophthalmic artery. Atheromatous change within the right siphon without high-grade stenosis. Left A1 segment patent. Right A1 attenuated but patent proximally. Suspected filling defect involving the anterior communicating artery complex and proximal right A2 segment (series 6, images 106, 105). Right ACA is attenuated but patent distally. Left ACA widely patent. Right M1 segment attenuated but patent. No visible right MCA branch occlusion or high-grade stenosis. Left M1 segment patent without stenosis. 6 mm laterally directed saccular  aneurysm arises from the left MCA bifurcation. Distal left MCA branches are well perfused. Posterior circulation: Both V4 segments patent without stenosis. Both PICA patent. Basilar patent without stenosis. Superior cerebellar arteries patent bilaterally. Both PCAs primarily supplied via the basilar. Mild atheromatous irregularity about the PCAs bilaterally without high-grade stenosis. Venous sinuses: Patent allowing for timing the contrast bolus. Anatomic variants: As above. Large evolving right MCA/ACA distribution infarct noted. Review of the MIP images confirms the above findings IMPRESSION: 1. 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 Distal reconstitution via the ophthalmic artery. Right MCA and its distal branches are attenuated but remain patent. 2. Focal filling defects involving the anterior communicating artery complex and proximal right A2 segment, likely subocclusive thrombus. Right ACA is attenuated but patent distally.  3. Large evolving right MCA/ACA distribution infarct. 4. 6 mm left MCA bifurcation aneurysm. 5. Severe emphysema with irregular post treatment changes within the left upper lobe, similar to prior CT. Superimposed hazy and patchy opacities favored to reflect edema and/or atelectasis. 6. Enlarged mediastinal and right hilar adenopathy as above. Aortic Atherosclerosis (ICD10-I70.0) and Emphysema (ICD10-J43.9). Critical Value/emergent results were called by telephone at the time of interpretation on 02/18/2023 at 9:15 pm to provider [facility] , who verbally acknowledged these results. Electronically Signed   By: Rise Mu M.D.   On: 02/18/2023 21:22   CT Head Wo Contrast  Result Date: 02/21/2023 CLINICAL DATA:  Altered mental status. Incoherent speech with left-sided weakness and right-sided gaze. EXAM: CT HEAD WITHOUT CONTRAST TECHNIQUE: Contiguous axial images were obtained from the base of the skull through the vertex without  intravenous contrast. RADIATION DOSE REDUCTION: This exam was performed according to the departmental dose-optimization program which includes automated exposure control, adjustment of the mA and/or kV according to patient size and/or use of iterative reconstruction technique. COMPARISON:  CT head dated October 08, 2021. FINDINGS: Brain: Loss of the normal gray-white matter differentiation in the right MCA territory involving the frontal, parietal, and occipital lobes, concerning for acute infarct. There are also new areas of hypodensity in the left cerebellum concerning for age-indeterminate lacunar infarcts. No evidence of hemorrhage, hydrocephalus, extra-axial collection or mass lesion/mass effect. Stable mild chronic microvascular ischemic changes. Vascular: Atherosclerotic vascular calcification of the carotid siphons. No hyperdense vessel. Skull: Normal. Negative for fracture or focal lesion. Sinuses/Orbits: New mild mucosal thickening with small air-fluid level in the right maxillary sinus. The orbits are unremarkable. Other: None. IMPRESSION: 1. Findings concerning for large right MCA territory acute infarct. No hemorrhage or mass effect. 2. New areas of hypodensity in the left cerebellum concerning for age-indeterminate lacunar infarcts. Electronically Signed   By: Obie Dredge M.D.   On: 02/05/2023 19:09   DG Chest Port 1 View  Result Date: 02/18/2023 CLINICAL DATA:  Sepsis.  History of lung cancer. EXAM: PORTABLE CHEST 1 VIEW COMPARISON:  November 12, 2019.  November 08, 2022. FINDINGS: The heart size and mediastinal contours are within normal limits. Stable radiation changes are seen involving both upper lobes. Emphysematous disease is again noted. No definite acute abnormality is noted. The visualized skeletal structures are unremarkable. IMPRESSION: Stable radiation changes involving both upper lobes. No definite acute abnormality is noted. Emphysema (ICD10-J43.9). Electronically Signed   By: Lupita Raider M.D.   On: 02/16/2023 18:41      IMPRESSION AND PLAN:  Assessment and Plan: * Acute cerebral infarction [facility]) - This is an acute right MCA territory infarction with subsequent left-sided hemiplegia. - He may be having left cerebellar infarction as well. - He will be admitted to a medical telemetry bed. - We will follow neuro checks q.4 hours for 24 hours.   - The patient will be placed on aspirin.   - Will obtain a brain MRI without contrast as well as  2D echo with bubble study .   - A neurology consultation  as well as physical/occupation/speech therapy consults will be obtained in a.m.. - Dr. Wilford Corner was notified about the patient. - The patient will be placed on statin therapy and fasting lipids will be checked.   Sepsis due to undetermined organism [facility]) - This is manifested by tachycardia and tachypnea. - Will obtain a chest CTA in a.m. - Will empirically place him on IV Rocephin and Zithromax for the  possibility of underlying pneumonia. - He will be hydrated with IV normal saline. - We will follow blood cultures.  Peripheral neuropathy - We will continue Neurontin.  History of deep venous thrombosis (DVT) of distal vein of left lower extremity - We will continue Eliquis.  GERD without esophagitis - We will continue PPI therapy as well as Carafate and H2 blocker therapy.  Chronic obstructive pulmonary disease (COPD) (HCC) - We will continue his Theophylline as well as his inhalers while holding off long-acting beta agonist.   DVT prophylaxis: Eliquis. Advanced Care Planning:  Code Status: full code.  Family Communication:  The plan of care was discussed in details with the patient (and family). I answered all questions. The patient agreed to proceed with the above mentioned plan. Further management will depend upon hospital course. Disposition Plan: Back to previous home environment Consults called: none.  All the records are reviewed and case discussed with  ED provider.  Status is: Inpatient   At the time of the admission, it appears that the appropriate admission status for this patient is inpatient.  This is judged to be reasonable and necessary in order to provide the required intensity of service to ensure the patient's safety given the presenting symptoms, physical exam findings and initial radiographic and laboratory data in the context of comorbid conditions.  The patient requires inpatient status due to high intensity of service, high risk of further deterioration and high frequency of surveillance required.  I certify that at the time of admission, it is my clinical judgment that the patient will require inpatient hospital care extending more than 2 midnights.                            Dispo: The patient is from: Home              Anticipated d/c is to: Home              Patient currently is not medically stable to d/c.              Difficult to place patient: No  Hannah Beat M.D on 02/04/2023 at 11:33 PM  [facility]   From 7 PM-7 AM, contact night-coverage [email or web address]  CC: Primary care physician; Lauro Regulus, MD

## 2023-02-24 NOTE — ED Notes (Signed)
Spoke with Dr. Arville Care re: Lactic acid level and the IP floor requesting a covid swab due to the patient having a cough and going to a semi-private room.  New order received to re-check lactic in 3 hours and covid swab.

## 2023-02-24 NOTE — ED Triage Notes (Signed)
Patient to ED via EMS after welfare check; was found unresponsive in home with no AC on; last time he spoke to family was on Tuesday after pumping gas.  Recently on Eliquis for DVT. Patient has incomprehensible speech and right sided gaze.

## 2023-02-24 NOTE — Assessment & Plan Note (Signed)
-   We will continue his Theophylline as well as his inhalers while holding off long-acting beta agonist.

## 2023-02-24 NOTE — Assessment & Plan Note (Signed)
-   We will continue Neurontin. ?

## 2023-02-24 NOTE — ED Notes (Signed)
Patient transported to MRI 

## 2023-02-24 NOTE — Assessment & Plan Note (Addendum)
-   We will continue PPI therapy as well as Carafate and H2 blocker therapy.

## 2023-02-24 NOTE — Assessment & Plan Note (Addendum)
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 - He may be having left cerebellar infarction as well. - He will be admitted to a progressive unit bed. - We will follow neuro checks q.4 hours for 24 hours.   - The patient will be placed on aspirin.   - Will obtain a brain MRI without contrast as well as  2D echo with bubble study .   - A neurology consultation  as well as physical/occupation/speech therapy consults will be obtained in a.m.. - Dr. Wilford Corner was notified about the patient. - The patient will be placed on statin therapy and fasting lipids will be checked.

## 2023-02-24 NOTE — Progress Notes (Signed)
       CROSS COVER NOTE  NAME: Steve Gregory MRN: [id number] DOB : 02/25/1956    Concern as stated by nurse / staff   Page from nurse regarding patient lethargy and increasing oxygen requirements     Pertinent findings on chart review: Patient presented to ED with acute left sided weakness and expressive aphasia with unknown time onset. Had recent DVT diagnosed and was on eliquis. PMH (significant is patient with moderate COPD with emphysema and SBRT to his right lower lobe for stage I non-small cell lung cancer 2019 with enlarged mediastinal lymph nodes found in 10/2022 recommended follow up with repeat scan in 3-4 months) H & P and imaging reviewed. Patient was diagnosed with acute right MCA territory infarct H & P exam reportts patient was alert and oriented x3. Discussed with admitting physician.  Upr\graded to progressive care CTA ches r/o PE odered for am secondary to already receiving contrast  Assessment and  Interventions   Assessment:  Plan: Continue to monitor resp status Swallow eval when more appropriate Keep npo for now       [name] NP [facility] Cross Cover 7pm-7am - check amion for availability Pager [phone]

## 2023-02-24 NOTE — ED Notes (Signed)
Patient transported to CT 

## 2023-02-24 NOTE — ED Notes (Signed)
Dr. Arville Care paged to report critical lactic waiting for return phone call

## 2023-02-24 NOTE — Assessment & Plan Note (Signed)
-   We will continue Eliquis. 

## 2023-02-24 NOTE — Assessment & Plan Note (Signed)
-   This is manifested by tachycardia and tachypnea. - Will obtain a chest CTA in a.m. - Will empirically place him on IV Rocephin and Zithromax for the possibility of underlying pneumonia. - He will be hydrated with IV normal saline. - We will follow blood cultures.

## 2023-02-25 ENCOUNTER — Inpatient Hospital Stay: Payer: Medicare Other

## 2023-02-25 ENCOUNTER — Encounter: Payer: Self-pay | Admitting: Family Medicine

## 2023-02-25 ENCOUNTER — Inpatient Hospital Stay (HOSPITAL_COMMUNITY)
Admit: 2023-02-25 | Discharge: 2023-02-25 | Disposition: A | Discharge status: Home / Self Care | Payer: Medicare Other | Attending: Family Medicine | Admitting: Family Medicine

## 2023-02-25 DIAGNOSIS — I2699 Other pulmonary embolism without acute cor pulmonale: Secondary | ICD-10-CM | POA: Diagnosis not present

## 2023-02-25 DIAGNOSIS — K219 Gastro-esophageal reflux disease without esophagitis: Secondary | ICD-10-CM | POA: Diagnosis not present

## 2023-02-25 DIAGNOSIS — J189 Pneumonia, unspecified organism: Secondary | ICD-10-CM

## 2023-02-25 DIAGNOSIS — I6389 Other cerebral infarction: Secondary | ICD-10-CM

## 2023-02-25 DIAGNOSIS — Z86718 Personal history of other venous thrombosis and embolism: Secondary | ICD-10-CM | POA: Diagnosis not present

## 2023-02-25 DIAGNOSIS — A419 Sepsis, unspecified organism: Secondary | ICD-10-CM | POA: Diagnosis not present

## 2023-02-25 DIAGNOSIS — I639 Cerebral infarction, unspecified: Secondary | ICD-10-CM

## 2023-02-25 DIAGNOSIS — J449 Chronic obstructive pulmonary disease, unspecified: Secondary | ICD-10-CM | POA: Diagnosis not present

## 2023-02-25 DIAGNOSIS — Z515 Encounter for palliative care: Secondary | ICD-10-CM | POA: Diagnosis not present

## 2023-02-25 DIAGNOSIS — J432 Centrilobular emphysema: Secondary | ICD-10-CM

## 2023-02-25 LAB — CBC WITH DIFFERENTIAL/PLATELET
Abs Immature Granulocytes: 0.11 10*3/uL — ABNORMAL HIGH (ref 0.00–0.07)
Basophils Absolute: 0.1 10*3/uL (ref 0.0–0.1)
Basophils Relative: 1 %
Eosinophils Absolute: 0.1 10*3/uL (ref 0.0–0.5)
Eosinophils Relative: 1 %
HCT: 31.6 % — ABNORMAL LOW (ref 39.0–52.0)
Hemoglobin: 9.9 g/dL — ABNORMAL LOW (ref 13.0–17.0)
Immature Granulocytes: 1 %
Lymphocytes Relative: 10 %
Lymphs Abs: 1.4 10*3/uL (ref 0.7–4.0)
MCH: 28.9 pg (ref 26.0–34.0)
MCHC: 31.3 g/dL (ref 30.0–36.0)
MCV: 92.4 fL (ref 80.0–100.0)
Monocytes Absolute: 1.1 10*3/uL — ABNORMAL HIGH (ref 0.1–1.0)
Monocytes Relative: 7 %
Neutro Abs: 12.1 10*3/uL — ABNORMAL HIGH (ref 1.7–7.7)
Neutrophils Relative %: 80 %
Platelets: 122 10*3/uL — ABNORMAL LOW (ref 150–400)
RBC: 3.42 MIL/uL — ABNORMAL LOW (ref 4.22–5.81)
RDW: 13.6 % (ref 11.5–15.5)
WBC: 15 10*3/uL — ABNORMAL HIGH (ref 4.0–10.5)
nRBC: 0 % (ref 0.0–0.2)

## 2023-02-25 LAB — COMPREHENSIVE METABOLIC PANEL
ALT: 14 U/L (ref 0–44)
AST: 26 U/L (ref 15–41)
Albumin: 2.6 g/dL — ABNORMAL LOW (ref 3.5–5.0)
Alkaline Phosphatase: 62 U/L (ref 38–126)
Anion gap: 12 (ref 5–15)
BUN: 17 mg/dL (ref 8–23)
CO2: 22 mmol/L (ref 22–32)
Calcium: 7.5 mg/dL — ABNORMAL LOW (ref 8.9–10.3)
Chloride: 107 mmol/L (ref 98–111)
Creatinine, Ser: 0.98 mg/dL (ref 0.61–1.24)
GFR, Estimated: >60 mL/min (ref >60)
Glucose, Bld: 77 mg/dL (ref 70–99)
Potassium: 3.3 mmol/L — ABNORMAL LOW (ref 3.5–5.1)
Sodium: 141 mmol/L (ref 135–145)
Total Bilirubin: 1 mg/dL (ref 0.3–1.2)
Total Protein: 5.2 g/dL — ABNORMAL LOW (ref 6.5–8.1)

## 2023-02-25 LAB — BLOOD CULTURE ID PANEL (REFLEXED) - BCID2

## 2023-02-25 LAB — ECHOCARDIOGRAM COMPLETE BUBBLE STUDY
AR max vel: 2.18 cm2
AV Area VTI: 2.51 cm2
AV Area mean vel: 2.49 cm2
AV Mean grad: 5.7 mmHg
AV Peak grad: 11.8 mmHg
Ao pk vel: 1.72 m/s
Area-P 1/2: 6.32 cm2
Calc EF: 35.9 %
MV VTI: 6.08 cm2
S' Lateral: 3.8 cm
Single Plane A2C EF: 33.9 %
Single Plane A4C EF: 35.5 %

## 2023-02-25 LAB — APTT
aPTT: 41 seconds — ABNORMAL HIGH (ref 24–36)
aPTT: 48 seconds — ABNORMAL HIGH (ref 24–36)

## 2023-02-25 LAB — LIPID PANEL
Cholesterol: 204 mg/dL — ABNORMAL HIGH (ref 0–200)
HDL: 33 mg/dL — ABNORMAL LOW (ref >40)
LDL Cholesterol: 145 mg/dL — ABNORMAL HIGH (ref 0–99)
Total CHOL/HDL Ratio: 6.2 RATIO
Triglycerides: 128 mg/dL (ref <150)
VLDL: 26 mg/dL (ref 0–40)

## 2023-02-25 LAB — GLUCOSE, CAPILLARY: Glucose-Capillary: 62 mg/dL — ABNORMAL LOW (ref 70–99)

## 2023-02-25 LAB — MAGNESIUM: Magnesium: 1.4 mg/dL — ABNORMAL LOW (ref 1.7–2.4)

## 2023-02-25 LAB — HEPARIN LEVEL (UNFRACTIONATED): Heparin Unfractionated: 0.64 IU/mL (ref 0.30–0.70)

## 2023-02-25 LAB — LACTIC ACID, PLASMA
Lactic Acid, Venous: 0.9 mmol/L (ref 0.5–1.9)
Lactic Acid, Venous: 1 mmol/L (ref 0.5–1.9)

## 2023-02-25 LAB — PROTIME-INR
INR: 1.5 — ABNORMAL HIGH (ref 0.8–1.2)
Prothrombin Time: 18.4 seconds — ABNORMAL HIGH (ref 11.4–15.2)

## 2023-02-25 MED ORDER — IOHEXOL 350 MG/ML SOLN
75.0000 mL | Freq: Once | INTRAVENOUS | Status: AC | PRN
  Administered 2023-02-25: 75 mL via INTRAVENOUS

## 2023-02-25 MED ORDER — DEXTROSE IN LACTATED RINGERS 5 % IV SOLN
INTRAVENOUS | Status: DC
  Administered 2023-03-02 – 2023-03-03 (×2): 1000 mL via INTRAVENOUS

## 2023-02-25 MED ORDER — POTASSIUM CHLORIDE 10 MEQ/100ML IV SOLN
10.0000 meq | INTRAVENOUS | Status: AC
  Administered 2023-02-25 (×5): 10 meq via INTRAVENOUS
  Filled 2023-02-25 (×6): qty 100

## 2023-02-25 MED ORDER — PANTOPRAZOLE SODIUM 40 MG IV SOLR
40.0000 mg | Freq: Two times a day (BID) | INTRAVENOUS | Status: DC
  Administered 2023-02-25 – 2023-03-02 (×7): 40 mg via INTRAVENOUS
  Filled 2023-02-25 (×6): qty 10

## 2023-02-25 MED ORDER — HEPARIN (PORCINE) 25000 UT/250ML-% IV SOLN
1350.0000 [IU]/h | INTRAVENOUS | Status: DC
  Administered 2023-02-25: 1200 [IU]/h via INTRAVENOUS
  Administered 2023-02-27 – 2023-03-03 (×6): 1350 [IU]/h via INTRAVENOUS
  Filled 2023-02-25 (×8): qty 250

## 2023-02-25 NOTE — Evaluation (Addendum)
Clinical/Bedside Swallow Evaluation Patient Details  Name: Steve Gregory MRN: [id number] Date of Birth: 1955/09/12  Today's Date: 02/25/2023 Time: SLP Start Time (ACUTE ONLY): 1020 SLP Stop Time (ACUTE ONLY): 1120 SLP Time Calculation (min) (ACUTE ONLY): 60 min  Past Medical History:  Past Medical History:  Diagnosis Date   Allergy    Anxiety    Arthritis    Barrett's esophagus    Bronchitis, chronic (HCC)    Cancer (HCC)    LUNG   COPD (chronic obstructive pulmonary disease) (HCC)    Cough    CHRONIC   Depression    Dizziness    DVT (deep venous thrombosis) (HCC)    Dysphagia    Essential hypertension 06/28/2017   GERD (gastroesophageal reflux disease)    H/O emphysema (HCC)    Headache    History of depression 06/05/2014   Last Assessment & Plan:  Mood is doing well on medications.    History of hiatal hernia    Hoarseness of voice    partial paralyzed vocal cord   HOH (hard of hearing)    Hypertension    Lung mass    Maxillary fracture (HCC)    LEFT SIDE   Orthopnea    Oxygen decrease    H/O HOME USE, NOT NOW   Personal history of colonic polyps    Pneumonia    PSEUDOMONAL PNEUMONIA IN THE PAST   PVD (peripheral vascular disease) (HCC) 06/05/2014   Overview:  Smoker with poor pulses  Last Assessment & Plan:  No leg pain is noted of late.    Renal failure    PRERENAL   Seizures (HCC)    sounds like it could be vagal at times, coughing can cause him to fade out and see white lights. had last sz 3 months ago when not taking meds   Shortness of breath dyspnea    uses many different inhalers   Squamous cell carcinoma of skin 01/07/2020   right mid volar forearm, mid back spinal   Varicose veins of both lower extremities with pain 06/28/2017   Wheezing    Past Surgical History:  Past Surgical History:  Procedure Laterality Date   CATARACT EXTRACTION W/PHACO Right 10/27/2015   Procedure: CATARACT EXTRACTION PHACO AND INTRAOCULAR LENS PLACEMENT (IOC) suture  placed in right eye at end of procedure;  Surgeon: Sallee Lange, MD;  Location: [facility] ORS;  Service: Ophthalmology;  Laterality: Right;  [place] 01:02 AP% 24.4 CDE 28.12 fluid pack lot # [id number]   CATARACT EXTRACTION W/PHACO Left 11/17/2015   Procedure: CATARACT EXTRACTION PHACO AND INTRAOCULAR LENS PLACEMENT (IOC);  Surgeon: Sallee Lange, MD;  Location: [facility] ORS;  Service: Ophthalmology;  Laterality: Left;  [place] 01:29 AP% 24.3 CDE 40.05 fluid pack lot # [id number]   COLONOSCOPY WITH PROPOFOL N/A 07/28/2017   Procedure: COLONOSCOPY WITH PROPOFOL;  Surgeon: Christena Deem, MD;  Location: [facility] ENDOSCOPY;  Service: Endoscopy;  Laterality: N/A;   COLONOSCOPY WITH PROPOFOL N/A 06/09/2020   Procedure: COLONOSCOPY WITH PROPOFOL;  Surgeon: Regis Bill, MD;  Location: [facility] ENDOSCOPY;  Service: Endoscopy;  Laterality: N/A;   ESOPHAGOGASTRODUODENOSCOPY (EGD) WITH PROPOFOL N/A 06/06/2017   Procedure: ESOPHAGOGASTRODUODENOSCOPY (EGD) WITH PROPOFOL;  Surgeon: Christena Deem, MD;  Location: [facility] ENDOSCOPY;  Service: Endoscopy;  Laterality: N/A;   ESOPHAGOGASTRODUODENOSCOPY (EGD) WITH PROPOFOL N/A 06/09/2020   Procedure: ESOPHAGOGASTRODUODENOSCOPY (EGD) WITH PROPOFOL;  Surgeon: Regis Bill, MD;  Location: [facility] ENDOSCOPY;  Service: Endoscopy;  Laterality: N/A;   ESOPHAGOGASTRODUODENOSCOPY (EGD) WITH PROPOFOL N/A  04/06/2022   Procedure: ESOPHAGOGASTRODUODENOSCOPY (EGD) WITH PROPOFOL;  Surgeon: Regis Bill, MD;  Location: [facility] ENDOSCOPY;  Service: Endoscopy;  Laterality: N/A;   EYE SURGERY     FACIAL RECONSTRUCTION SURGERY  1980   LEFT CHEEK BONE INJURY 1980   HPI:  Pt is a 67 y.o. Caucasian male with medical history significant for lung cancer status post SBRT, COPD, depression, DVT, dysphagia w/ Barrett's Esophagus, anxiety and osteoarthritis, headaches, peripheral vascular disease, documented history of seizures, chronic dyspnea brought in after being found down by family with a  last known well sometime on Tuesday, 02/22/2023.  Patient unable to provide history.  Pt presented w/ Left sided weakness and expressive aphasia at admit.  The patient's brother stated that on Tuesday he told him he is eyes were crossed and he had double vision then they did not hear from him for 2 days.  He was recently diagnosed with left lower extremity DVT and placed on Eliquis.   Noncontrasted Head CT scan showed large right MCA territory acute infarction with no hemorrhage or mass effect.  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 Distal reconstitution via the ophthalmic artery. Right MCA and its distal branches are attenuated but remain patent. Focal filling defects involving the anterior communicating artery complex and proximal right A2 segment, likely subocclusive thrombus. Right ACA is attenuated but patent distally.   Large evolving right MCA/ACA distribution infarct.  MRI brain confirmed the findings.    CXR at admit: Stable radiation changes involving both upper lobes. No definite  acute abnormality is noted. Emphysema.  CTA of Chest following: segmental and subsegmental PE to the right lower lobe, increasing mediastinal lymphadenopathy and right hilar nodal stations concerning for potential metastatic disease, increasing areas of airspace consolidation right lung most severe in right middle and lower lobes concerning for pneumonia, diffuse bronchial wall thickening.  MD spoke to family about the potential metastatic lung disease on 02/25/2023. Oncology to be consulted.  OF NOTE: pt had a MBSS in 10/2021:  "Patient presents with grossly functional oropharyngeal swallow and suspected primary esophageal dysphagia. Despite left vocal cord paresis and atrophy, patient has adequate airway protection, with no laryngeal penetration or aspiration observed on this exam. Oral stage is [facility], characterized by adequate bolus containment, oral control and anterior to  posterior transfer. Swallow initiation occurs at the level of the valleculae. Pharyngeal stage is characterized by adequate tongue base retraction, pharyngeal constriction and hyolaryngeal excursion. Tissue prominence of the cricopharyngeus muscle consistent with appearance of CP bar reduces amplitude of opening of the PE segment, trapping the tail of the bolus in the pharynx with some retrograde flow noted. Pt senses residue and clears with subsequent swallows. Clearance through the cervical esophagus appears slow. An esophageal sweep was performed in standing AP position and was noted for dysmotility per radiology PA. Recommend further esophageal assessment such as manometry as abnormal esophageal pressures may contribute to pharyngeal retention.". A Regular diet w/ thins was recommended then.     Assessment / Plan / Recommendation  Clinical Impression   Pt seen today for BSE. Pt w/ head turn/lean preference to Left side (supported w/ towel roll, pillow for more midline alignment) and nonverbal status. He minimally responded 2x w/ a phonation to direct calling of his name(mod++ stim). Eyes intermittently open. Family present in room.  Pt on [place] O2 support 4L; afebrile. WBC elevated at 15.0.  Pt appears to present w/ Profound oropharyngeal phase dysphagia  w/ sensorimotor deficits -- suspect impact from the Large evolving Right MCA/ACA distribution infarct, MRI confirmed. Pt is at HIGH risk for aspiration/aspiration pneumonia w/ any oral intake at this time. Pt also exhibits significantly declined Cognitive awareness of self and environment and remained nonverbal during this eval making no attempts to communicate/respond.  Pt was given oral care then single ice chips trials x2. He exhibited severely decreased awareness/engagement during po tasks which increases risk for aspiration. He required mod-max tactile/verbal/visual cues for orientation to bolus presentation and acceptance of the single ice chips.  Upon placing the ice chip trials anteriorly in mouth, pt closed lips then demonstrated No bolus manipulation nor mastication -- no oral movements followed. Strong Coughing post ~1-2 mins -- suspect aspiration of melted ice chips(water - thin). No further po trials were given. OM exam was cursory d/t pt's reduced comprehension and follow through. Tongue remained at rest behind teeth. Unilateral/oral weakness was difficult to assess - no volitional oral movements and prominent head lean to Left. During oral care w/ swabs, pt reacted reflexively and w/ agitation to oral care performed by this SLP - no biting on swabs but smacking and lingual pushing against swab noted. No immediate unilateral lingual weakness noted.  He often had eyes closed b/t trials when not given stimulation.   In setting of pt's Profound oropharyngeal phase dysphagia presentation and HIGH risk for aspiration/aspiration pneumonia, recommend strict NPO status. Frequent oral care for hygiene and stimulation of oral movements. Aspiration precautions w/ oral care. ST services will continue to follow for ongoing assessment of status, education w/ Team and Family. NSG/MD(present) updated. Answered Family's questions and referred to Palliative Care.  SLP Visit Diagnosis: Dysphagia, oropharyngeal phase (R13.12) (profound)    Aspiration Risk  Severe aspiration risk;Risk for inadequate nutrition/hydration    Diet Recommendation   NPO (strict) - for oral stimulation/movement and hygiene   Medication Administration: Via alternative means    Other  Recommendations Recommended Consults:  (Palliative Care to follow; Dietician) Oral Care Recommendations: Oral care QID;Staff/trained caregiver to provide oral care (for oral stimulation/movement and hygiene)    Recommendations for follow up therapy are one component of a multi-disciplinary discharge planning process, led by the attending physician.  Recommendations may be updated based on patient  status, additional functional criteria and insurance authorization.  Follow up Recommendations Follow physician's recommendations for discharge plan and follow up therapies      Assistance Recommended at Discharge  full  Functional Status Assessment Patient has had a recent decline in their functional status and/or demonstrates limited ability to make significant improvements in function in a reasonable and predictable amount of time (tbd)  Frequency and Duration min 2x/week  2 weeks       Prognosis Prognosis for improved oropharyngeal function: Guarded Barriers to Reach Goals: Cognitive deficits;Language deficits;Time post onset;Severity of deficits;Behavior Barriers/Prognosis Comment: large R CVA      Swallow Study   General Date of Onset: 02/22/2023 HPI: Pt is a 67 y.o. Caucasian male with medical history significant for lung cancer status post SBRT, COPD, depression, DVT, dysphagia w/ Barrett's Esophagus, anxiety and osteoarthritis, headaches, peripheral vascular disease, documented history of seizures, chronic dyspnea brought in after being found down by family with a last known well sometime on Tuesday, 02/22/2023.  Patient unable to provide history.  Pt presented w/ Left sided weakness and expressive aphasia at admit.  The patient's brother stated that on Tuesday he told him he is eyes were crossed and he had double vision  then they did not hear from him for 2 days.  He was recently diagnosed with left lower extremity DVT and placed on Eliquis.     Noncontrasted Head CT scan showed large right MCA territory acute infarction with no hemorrhage or mass effect.  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 Distal reconstitution via the ophthalmic artery. Right MCA and its distal branches are attenuated but remain patent. Focal filling defects involving the anterior communicating artery complex and proximal right A2 segment, likely subocclusive  thrombus. Right ACA is attenuated but patent distally.  Large evolving right MCA/ACA distribution infarct.  MRI brain confirmed the findings.   CXR at admit: Stable radiation changes involving both upper lobes. No definite  acute abnormality is noted. Emphysema.  CTA of Chest following: segmental and subsegmental PE to the right lower lobe, increasing mediastinal lymphadenopathy and right hilar nodal stations concerning for potential metastatic disease, increasing areas of airspace consolidation right lung most severe in right middle and lower lobes concerning for pneumonia, diffuse bronchial wall thickening.  MD spoke to family about the potential metastatic lung disease on 02/25/2023. . Type of Study: Bedside Swallow Evaluation Previous Swallow Assessment: 10/2021- mbss w/ rec. for Regular diet, thins Diet Prior to this Study: NPO (brother stated he felt a regular diet at home prior) Temperature Spikes Noted: No (wbc 15.0) Respiratory Status: Nasal cannula (4L) History of Recent Intubation: No Behavior/Cognition: Lethargic/Drowsy;Doesn't follow directions;Requires cueing (Nonverbal) Oral Cavity Assessment: Dried secretions (sticky) Oral Care Completed by SLP: Yes Oral Cavity - Dentition: Poor condition;Missing dentition Vision:  (n/a) Self-Feeding Abilities: Total assist Patient Positioning: Upright in bed (total positioning assistance) Baseline Vocal Quality:  (nonverbal - 2 min phonations) Volitional Cough: Cognitively unable to elicit Volitional Swallow: Unable to elicit    Oral/Motor/Sensory Function Overall Oral Motor/Sensory Function:  (difficult to assess - little to no volitional movements and head lean to Left)   Ice Chips Ice chips: Impaired Presentation: Spoon (fed; 2 trials) Oral Phase Impairments: Poor awareness of bolus;Reduced lingual movement/coordination;Impaired mastication Oral Phase Functional Implications: Oral holding Pharyngeal Phase Impairments:  (Strong Coughing post  ~1-2 mins -- suspect aspiration of melted ice chip)   Thin Liquid Thin Liquid: Not tested    Nectar Thick Nectar Thick Liquid: Not tested   Honey Thick Honey Thick Liquid: Not tested   Puree Puree: Not tested   Solid     Solid: Not tested        Jerilynn Som, MS, CCC-SLP Speech Language Pathologist Rehab Services; [facility] [place] [phone] (ascom) Molly Maselli 02/25/2023,4:34 PM

## 2023-02-25 NOTE — Consult Note (Signed)
ANTICOAGULATION CONSULT NOTE  Pharmacy Consult for Heparin Indication: pulmonary embolus and stroke  Allergies  Allergen Reactions   Chantix [Varenicline] Swelling        Lipitor [Atorvastatin] Swelling   Mobic [Meloxicam] Other (See Comments)    Myalgia   Statins Other (See Comments)    Myalgia    Patient Measurements: Height: 5\' 9"  (175.3 cm) Weight: 71.9 kg (158 lb 9.6 oz) IBW/kg (Calculated) : 70.7 Heparin Dosing Weight: 71.9 kg  Vital Signs: Temp: 98.5 F (36.9 C) (06/28 1213) Temp Source: Oral (06/28 0642) BP: 134/59 (06/28 1213) Pulse Rate: 88 (06/28 1213)  Labs: Recent Labs    02/11/2023 1814 02/11/2023 2343  HGB 12.2* 9.9*  HCT 37.8* 31.6*  PLT 140* 122*  APTT 33 41*  LABPROT 17.4* 18.4*  INR 1.4* 1.5*  CREATININE 1.13 0.98  CKTOTAL 370  --     Estimated Creatinine Clearance: 74.1 mL/min (by C-G formula based on SCr of 0.98 mg/dL).   Medical History: Past Medical History:  Diagnosis Date   Allergy    Anxiety    Arthritis    Barrett's esophagus    Bronchitis, chronic (HCC)    Cancer (HCC)    LUNG   COPD (chronic obstructive pulmonary disease) (HCC)    Cough    CHRONIC   Depression    Dizziness    DVT (deep venous thrombosis) (HCC)    Dysphagia    Essential hypertension 06/28/2017   GERD (gastroesophageal reflux disease)    H/O emphysema (HCC)    Headache    History of depression 06/05/2014   Last Assessment & Plan:  Mood is doing well on medications.    History of hiatal hernia    Hoarseness of voice    partial paralyzed vocal cord   HOH (hard of hearing)    Hypertension    Lung mass    Maxillary fracture (HCC)    LEFT SIDE   Orthopnea    Oxygen decrease    H/O HOME USE, NOT NOW   Personal history of colonic polyps    Pneumonia    PSEUDOMONAL PNEUMONIA IN THE PAST   PVD (peripheral vascular disease) (HCC) 06/05/2014   Overview:  Smoker with poor pulses  Last Assessment & Plan:  No leg pain is noted of late.    Renal failure     PRERENAL   Seizures (HCC)    sounds like it could be vagal at times, coughing can cause him to fade out and see white lights. had last sz 3 months ago when not taking meds   Shortness of breath dyspnea    uses many different inhalers   Squamous cell carcinoma of skin 01/07/2020   right mid volar forearm, mid back spinal   Varicose veins of both lower extremities with pain 06/28/2017   Wheezing     Pertinent Medications:  Apixaban 5mg  BID, last dose: patient unable to specify, believes it was earlier this week   Assessment: 67 y.o. Caucasian male with medical history significant for anxiety, depression, GERD, essential hypertension, emphysema, peripheral vascular disease, Barrett's esophagus, anxiety and osteoarthritis, who presented to the emergency room with acute onset of left-sided weakness with expressive dysphasia of questionable onset. He was recently diagnosed with left lower extremity DVT and placed on Eliquis. 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 He was on anticoagulation at home. Per neurology, given the large size  of the stroke, anticoagulation puts him at risk of hemorrhagic conversion but if that has to be used for PE, and recommended using heparin drip stroke protocol with the lower HL/aPTT goals.  Goal of Therapy:  Heparin level 03.-0.5 units/ml aPTT 66-85 seconds Monitor platelets by anticoagulation protocol: Yes   Plan:  - AVOID BOLUS AT ANY TIME - Will start heparin infusion at 1200 units/hr - Check aPTT level in 6 hours and anti-Xa level daily while on heparin - Will dose with aPTT levels until correlation is found, then will transition to anti-Xa dosing - Continue to monitor H&H and platelets  Mabel Roll A Evany Schecter 02/25/2023,2:48 PM

## 2023-02-25 NOTE — Plan of Care (Signed)
  Problem: Education: Goal: Knowledge of disease or condition will improve Outcome: Progressing Goal: Knowledge of secondary prevention will improve (MUST DOCUMENT ALL) Outcome: Progressing Goal: Knowledge of patient specific risk factors will improve (Mark N/A or DELETE if not current risk factor) Outcome: Progressing   Problem: Ischemic Stroke/TIA Tissue Perfusion: Goal: Complications of ischemic stroke/TIA will be minimized Outcome: Progressing   Problem: Coping: Goal: Will verbalize positive feelings about self Outcome: Progressing Goal: Will identify appropriate support needs Outcome: Progressing   Problem: Health Behavior/Discharge Planning: Goal: Ability to manage health-related needs will improve Outcome: Progressing Goal: Goals will be collaboratively established with patient/family Outcome: Progressing   Problem: Self-Care: Goal: Ability to participate in self-care as condition permits will improve Outcome: Progressing Goal: Verbalization of feelings and concerns over difficulty with self-care will improve Outcome: Progressing Goal: Ability to communicate needs accurately will improve Outcome: Progressing   Problem: Nutrition: Goal: Risk of aspiration will decrease Outcome: Progressing Goal: Dietary intake will improve Outcome: Progressing   Problem: Fluid Volume: Goal: Hemodynamic stability will improve Outcome: Progressing   Problem: Clinical Measurements: Goal: Diagnostic test results will improve Outcome: Progressing Goal: Signs and symptoms of infection will decrease Outcome: Progressing   Problem: Respiratory: Goal: Ability to maintain adequate ventilation will improve Outcome: Progressing   Problem: Education: Goal: Knowledge of General Education information will improve Description: Including pain rating scale, medication(s)/side effects and non-pharmacologic comfort measures Outcome: Progressing   Problem: Health Behavior/Discharge  Planning: Goal: Ability to manage health-related needs will improve Outcome: Progressing   Problem: Clinical Measurements: Goal: Ability to maintain clinical measurements within normal limits will improve Outcome: Progressing Goal: Will remain free from infection Outcome: Progressing Goal: Diagnostic test results will improve Outcome: Progressing Goal: Respiratory complications will improve Outcome: Progressing Goal: Cardiovascular complication will be avoided Outcome: Progressing   Problem: Activity: Goal: Risk for activity intolerance will decrease Outcome: Progressing   Problem: Nutrition: Goal: Adequate nutrition will be maintained Outcome: Progressing   Problem: Coping: Goal: Level of anxiety will decrease Outcome: Progressing   Problem: Elimination: Goal: Will not experience complications related to bowel motility Outcome: Progressing Goal: Will not experience complications related to urinary retention Outcome: Progressing   Problem: Pain Managment: Goal: General experience of comfort will improve Outcome: Progressing   Problem: Safety: Goal: Ability to remain free from injury will improve Outcome: Progressing   Problem: Skin Integrity: Goal: Risk for impaired skin integrity will decrease Outcome: Progressing   

## 2023-02-25 NOTE — Progress Notes (Signed)
*  PRELIMINARY RESULTS* Echocardiogram 2D Echocardiogram has been performed.  Cristela Blue 02/25/2023, 2:55 PM

## 2023-02-25 NOTE — Consult Note (Signed)
ANTICOAGULATION CONSULT NOTE  Pharmacy Consult for Heparin Indication: pulmonary embolus and stroke  Allergies  Allergen Reactions   Chantix [Varenicline] Swelling        Lipitor [Atorvastatin] Swelling   Mobic [Meloxicam] Other (See Comments)    Myalgia   Statins Other (See Comments)    Myalgia    Patient Measurements: Height: 5\' 9"  (175.3 cm) Weight: 71.9 kg (158 lb 9.6 oz) IBW/kg (Calculated) : 70.7 Heparin Dosing Weight: 71.9 kg  Vital Signs: Temp: 98.4 F (36.9 C) (06/28 1733) BP: 153/58 (06/28 1733) Pulse Rate: 98 (06/28 1733)  Labs: Recent Labs    02/11/2023 1814 02/04/2023 2343  HGB 12.2* 9.9*  HCT 37.8* 31.6*  PLT 140* 122*  APTT 33 41*  LABPROT 17.4* 18.4*  INR 1.4* 1.5*  CREATININE 1.13 0.98  CKTOTAL 370  --      Estimated Creatinine Clearance: 74.1 mL/min (by C-G formula based on SCr of 0.98 mg/dL).   Medical History: Past Medical History:  Diagnosis Date   Allergy    Anxiety    Arthritis    Barrett's esophagus    Bronchitis, chronic (HCC)    Cancer (HCC)    LUNG   COPD (chronic obstructive pulmonary disease) (HCC)    Cough    CHRONIC   Depression    Dizziness    DVT (deep venous thrombosis) (HCC)    Dysphagia    Essential hypertension 06/28/2017   GERD (gastroesophageal reflux disease)    H/O emphysema (HCC)    Headache    History of depression 06/05/2014   Last Assessment & Plan:  Mood is doing well on medications.    History of hiatal hernia    Hoarseness of voice    partial paralyzed vocal cord   HOH (hard of hearing)    Hypertension    Lung mass    Maxillary fracture (HCC)    LEFT SIDE   Orthopnea    Oxygen decrease    H/O HOME USE, NOT NOW   Personal history of colonic polyps    Pneumonia    PSEUDOMONAL PNEUMONIA IN THE PAST   PVD (peripheral vascular disease) (HCC) 06/05/2014   Overview:  Smoker with poor pulses  Last Assessment & Plan:  No leg pain is noted of late.    Renal failure    PRERENAL   Seizures (HCC)     sounds like it could be vagal at times, coughing can cause him to fade out and see white lights. had last sz 3 months ago when not taking meds   Shortness of breath dyspnea    uses many different inhalers   Squamous cell carcinoma of skin 01/07/2020   right mid volar forearm, mid back spinal   Varicose veins of both lower extremities with pain 06/28/2017   Wheezing    Assessment: Steve Gregory is a 67 y.o. male presenting with acute onset of left-sided weakness with expressive dysphasia of questionable onset. PMH significant for anxiety, depression, GERD, HTN, emphysema, PVD, Barrett's esophagus, anxiety, OA. Patient was on apixaban 5 mg BID PTA with last dose thought to be earlier this week. He was recently diagnosed with left lower extremity DVT and placed on Eliquis. 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 He was on anticoagulation at home. Per neurology, given the large size of the stroke, anticoagulation puts him at risk of hemorrhagic conversion but if that has to  be used for PE, and recommended using heparin drip stroke protocol with lower HL/aPTT goals. Pharmacy has been consulted to initiate and manage heparin infusion.   Baseline Labs: aPTT 41, PT 18.4, INR 1.5, Hgb 9.9, Hct 31.6, Plt 122   Goal of Therapy:  Heparin level 03.-0.5 units/ml aPTT 66-85 seconds Monitor platelets by anticoagulation protocol: Yes   Date Time aPTT/HL Rate/Comment  6/28 2001 48/0.64 1200/aPTT subtherapeutic   Plan:  No boluses per MD Increase heparin infusion to 1350 units/hr Check aPTT in 6 hours Continue to monitor aPTT until HL and aPTT correlate.  Check HL daily for correlation until HL and aPTT correlate. Switch to HL monitoring once HL and aPTT correlate.  Continue to monitor H&H and platelets daily while on heparin infusion    Celene Squibb, PharmD Clinical Pharmacist 02/25/2023  8:57 PM

## 2023-02-25 NOTE — Progress Notes (Signed)
Progress Note   Patient: Steve Gregory [id number]:[id number] DOB: June 16, 1956 DOA: 02/18/2023     1 DOS: the patient was seen and examined on 02/25/2023   Brief hospital course: JEVONTA OKRAY is a 67 y.o. Caucasian male with medical history significant for anxiety, depression, GERD, essential hypertension, emphysema, peripheral vascular disease, Barrett's esophagus, anxiety and osteoarthritis, who presented to the emergency room with acute onset of left-sided weakness with expressive dysphasia of questionable onset.  The patient's brother stated that on Tuesday he told him he is eyes were crossed and he had double vision.  He was recently diagnosed with left lower extremity DVT and placed on Eliquis.    Noncontrasted head CT scan showed large right MCA territory acute infarction with no hemorrhage or mass effect.   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 Distal reconstitution via the ophthalmic artery. Right MCA and its distal branches are attenuated but remain patent. Focal filling defects involving the anterior communicating artery complex and proximal right A2 segment, likely subocclusive thrombus. Right ACA is attenuated but patent distally. Large evolving right MCA/ACA distribution infarct. MRI brain confirmed the findings. CTA chest showed - segmental and subsegmental PE to the right lower lobe, increasing mediastinal lymphadenopathy and right hilar nodal stations concerning for potential metastatic disease, increasing areas of airspace consolidation right lung most severe in right middle and lower lobes concerning for pneumonia, diffuse bronchial wall thickening, aortic atherosclerosis and calcifications of attic valve.  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 Continue neurochecks as per protocol.  His mental status is poor. Continue telemetry. Continue aspirin,  statin therapy. Neurology evaluation appreciated. Check echocardiogram, A1c, lipid profile. Discussed with neurologist regarding heparin drip without bolus due to his PE.  Advised oncology consult and palliative care to be involved. PT OT, speech therapy evaluation. N.p.o. for now.  Hold oral home medications.  Patient's overall prognosis is poor discussed with patient's brother at bedside.  He said that patient wishes to be full code.  Sepsis due to undetermined organism [facility]) Right middle and lower lobe pneumonia  CTA chest reveals pneumonia.  Possibly due to aspiration. Continue IV Rocephin and Zithromax for the possibility of underlying pneumonia. Blood cultures x 2 sets likely contamination.  Repeat blood cultures ordered. Continue with gentle IV fluids. Monitor vitals closely.  History of deep venous thrombosis (DVT) of distal vein of left lower extremity. Right-sided pulmonary embolism Patient does have segmental and subsegmental PE on right side on CT chest. Patient will be started on heparin drip per pharmacy protocol.  No bolus. Continue to monitor APTT.  GERD without esophagitis Will give IV PPI.  History of lung cancer status post SBRT Chronic obstructive pulmonary disease (COPD) (HCC) Continue DuoNebs, home inhalers. CTA chest revealed worsening lymphadenopathy potential metastatic disease.  Oncology recommended PET/CT once he is more stable.  DVT prophylaxis-on heparin drip CODE STATUS full code.  Palliative team consulted for goals of care discussion.      Subjective: Patient is seen and examined today morning. He is sleepy and lethargic. Unable to follow commands. Family at bedside.  Physical Exam: Vitals:   02/25/23 0330 02/25/23 0642 02/25/23 0835 02/25/23 1213  BP: 122/70 138/65 (!) 132/59 (!) 134/59  Pulse: 98 94 85 88  Resp: 20 20 16 18   Temp: 97.7 F (36.5 C) 98.2 F (36.8 C) 97.9 F (36.6 C) 98.5 F (36.9 C)  TempSrc: Axillary Oral  SpO2:  92% 93% 91% 97%  Weight:      Height:       General - Elderly Caucasian male, lethargic HEENT - PERRLA, EOMI, atraumatic head, non tender sinuses. Lung - Clear, diffuse rhonchi, wheezes. Heart - S1, S2 heard, no murmurs, rubs, trace pedal edema Neuro -sleepy, lethargic, unable to do full neuro exam. Skin - Warm and dry. Data Reviewed:  CTA chest, MRI, CBC, CMP, lipids, lactic acid, PT/INR, blood cultures, urine cultures  Family Communication: patient's brother update at bedside  Disposition: Status is: Inpatient Remains inpatient appropriate because: acute stroke work up and management  Planned Discharge Destination:  unsure at this time    MDM level 3- patient has h/o lung cancer, DVT now present with large stroke, multiple PE. He is at high risk for clinical deterioration.   Author: Marcelino Duster, MD 02/25/2023 1:48 PM  For on call review [email or web address].

## 2023-02-25 NOTE — Progress Notes (Signed)
PHARMACY - PHYSICIAN COMMUNICATION CRITICAL VALUE ALERT - BLOOD CULTURE IDENTIFICATION (BCID)  Steve Gregory is an 67 y.o. male who presented to [facility] on 02/02/2023 with a chief complaint of stroke-like symptoms  Assessment: 6/27 blood culture with GPR in 1 of 4 bottles (no BID performed since in < 2 sets) - this was previously reported.  Now there is GPC in 1 of 4 bottles (same set as the GPR).  BCID detects Staphylococcus species (Not S aureus or S epidermidis).    Name of physician (or Provider) ContactedClide Dales  Current antibiotics: Ceftriaxone/azith  Changes to prescribed antibiotics recommended:  Recommendations accepted by provider - monitor as likely contaminants  Results for orders placed or performed during the hospital encounter of 02/03/2023  Blood Culture ID Panel (Reflexed) (Collected: 02/02/2023  6:14 PM)  Result Value Ref Range   Enterococcus faecalis NOT DETECTED NOT DETECTED   Enterococcus Faecium NOT DETECTED NOT DETECTED   Listeria monocytogenes NOT DETECTED NOT DETECTED   Staphylococcus species DETECTED (A) NOT DETECTED   Staphylococcus aureus (BCID) NOT DETECTED NOT DETECTED   Staphylococcus epidermidis NOT DETECTED NOT DETECTED   Staphylococcus lugdunensis NOT DETECTED NOT DETECTED   Streptococcus species NOT DETECTED NOT DETECTED   Streptococcus agalactiae NOT DETECTED NOT DETECTED   Streptococcus pneumoniae NOT DETECTED NOT DETECTED   Streptococcus pyogenes NOT DETECTED NOT DETECTED   A.calcoaceticus-baumannii NOT DETECTED NOT DETECTED   Bacteroides fragilis NOT DETECTED NOT DETECTED   Enterobacterales NOT DETECTED NOT DETECTED   Enterobacter cloacae complex NOT DETECTED NOT DETECTED   Escherichia coli NOT DETECTED NOT DETECTED   Klebsiella aerogenes NOT DETECTED NOT DETECTED   Klebsiella oxytoca NOT DETECTED NOT DETECTED   Klebsiella pneumoniae NOT DETECTED NOT DETECTED   Proteus species NOT DETECTED NOT DETECTED   Salmonella species NOT DETECTED NOT  DETECTED   Serratia marcescens NOT DETECTED NOT DETECTED   Haemophilus influenzae NOT DETECTED NOT DETECTED   Neisseria meningitidis NOT DETECTED NOT DETECTED   Pseudomonas aeruginosa NOT DETECTED NOT DETECTED   Stenotrophomonas maltophilia NOT DETECTED NOT DETECTED   Candida albicans NOT DETECTED NOT DETECTED   Candida auris NOT DETECTED NOT DETECTED   Candida glabrata NOT DETECTED NOT DETECTED   Candida krusei NOT DETECTED NOT DETECTED   Candida parapsilosis NOT DETECTED NOT DETECTED   Candida tropicalis NOT DETECTED NOT DETECTED   Cryptococcus neoformans/gattii NOT DETECTED NOT DETECTED   Juliette Alcide, PharmD, BCPS, BCIDP Work Cell: [phone] 02/25/2023 1:48 PM

## 2023-02-25 NOTE — Consult Note (Signed)
Neurology Consultation  Reason for Consult: Stroke Referring Physician: Dr. Derrill Kay  CC: Left-sided paralysis  History is obtained from: Chart  HPI: Steve Gregory is a 67 y.o. male with documented past history of lung cancer status post SBRT, COPD, depression, DVT, dysphagia, headaches, peripheral vascular disease, documented history of seizures, chronic dyspnea brought in after being found down by family with a last known well sometime on Tuesday, 02/22/2023. Patient unable to provide history.  History obtained from chart review Presumably last known well by family when he was spoken to on Tuesday.  At that time he had started to have some vision changes while stopped at a gas station but was able to drive back home.  Upon not hearing from him for 2 days, they went to check on him and found him on the ground with altered mental status. Head CT done on arrival revealed large right hemispheric infarct. Outside the window for intervention-IV or endovascular.   Unable to reach family at this time.   LKW: Sometime on Tuesday, 02/22/2023 IV thrombolysis given?: no, outside the window EVT: Outside the window Premorbid modified Rankin scale (mRS): Unable to ascertain  ROS: Full ROS was performed and is negative except as noted in the HPI.   Past Medical History:  Diagnosis Date   Allergy    Anxiety    Arthritis    Barrett's esophagus    Bronchitis, chronic (HCC)    Cancer (HCC)    LUNG   COPD (chronic obstructive pulmonary disease) (HCC)    Cough    CHRONIC   Depression    Dizziness    DVT (deep venous thrombosis) (HCC)    Dysphagia    Essential hypertension 06/28/2017   GERD (gastroesophageal reflux disease)    H/O emphysema (HCC)    Headache    History of depression 06/05/2014   Last Assessment & Plan:  Mood is doing well on medications.    History of hiatal hernia    Hoarseness of voice    partial paralyzed vocal cord   HOH (hard of hearing)    Hypertension    Lung mass     Maxillary fracture (HCC)    LEFT SIDE   Orthopnea    Oxygen decrease    H/O HOME USE, NOT NOW   Personal history of colonic polyps    Pneumonia    PSEUDOMONAL PNEUMONIA IN THE PAST   PVD (peripheral vascular disease) (HCC) 06/05/2014   Overview:  Smoker with poor pulses  Last Assessment & Plan:  No leg pain is noted of late.    Renal failure    PRERENAL   Seizures (HCC)    sounds like it could be vagal at times, coughing can cause him to fade out and see white lights. had last sz 3 months ago when not taking meds   Shortness of breath dyspnea    uses many different inhalers   Squamous cell carcinoma of skin 01/07/2020   right mid volar forearm, mid back spinal   Varicose veins of both lower extremities with pain 06/28/2017   Wheezing      Family History  Problem Relation Age of Onset   Hyperlipidemia Mother    Stroke Father    Aneurysm Father    Heart disease Brother    Hypertension Brother     Social History:   reports that he has been smoking cigarettes. He has a 43.00 pack-year smoking history. He has never used smokeless tobacco. He reports that he does  not drink alcohol and does not use drugs.  Medications  Current Facility-Administered Medications:     stroke: early stages of recovery book, , Does not apply, Once, Mansy, Jan A, MD   0.9 %  sodium chloride infusion, , Intravenous, Continuous, Mansy, Jan A, MD, Stopped at 02/25/23 0128   acetaminophen (TYLENOL) tablet 650 mg, 650 mg, Oral, Q4H PRN **OR** acetaminophen (TYLENOL) 160 MG/5ML solution 650 mg, 650 mg, Per Tube, Q4H PRN **OR** acetaminophen (TYLENOL) suppository 650 mg, 650 mg, Rectal, Q4H PRN, Mansy, Jan A, MD, 650 mg at 02/04/2023 2309   albuterol (PROVENTIL) (2.5 MG/3ML) 0.083% nebulizer solution 3 mL, 3 mL, Inhalation, Q6H PRN, Mansy, Jan A, MD, 3 mL at 01/29/2023 2218   azithromycin (ZITHROMAX) 500 mg in sodium chloride 0.9 % 250 mL IVPB, 500 mg, Intravenous, Q24H, Mansy, Jan A, MD, Stopped at 02/25/23  0105   cefTRIAXone (ROCEPHIN) 2 g in sodium chloride 0.9 % 100 mL IVPB, 2 g, Intravenous, Q24H, Mansy, Jan A, MD, Stopped at 02/25/23 [phone]   enoxaparin (LOVENOX) injection 40 mg, 40 mg, Subcutaneous, Q24H, Mansy, Jan A, MD, 40 mg at 02/01/2023 2217   fluticasone (FLONASE) 50 MCG/ACT nasal spray 2 spray, 2 spray, Each Nare, Daily PRN, Mansy, Jan A, MD   losartan (COZAAR) tablet 50 mg, 50 mg, Oral, Daily, Mansy, Jan A, MD   magnesium hydroxide (MILK OF MAGNESIA) suspension 30 mL, 30 mL, Oral, Daily PRN, Mansy, Jan A, MD   mirtazapine (REMERON) tablet 7.5 mg, 7.5 mg, Oral, QHS, Mansy, Jan A, MD   montelukast (SINGULAIR) tablet 10 mg, 10 mg, Oral, Daily, Mansy, Jan A, MD   ondansetron [facility]) injection 4 mg, 4 mg, Intravenous, Q4H PRN, Mansy, Jan A, MD   pantoprazole (PROTONIX) EC tablet 40 mg, 40 mg, Oral, BID AC, Mansy, Jan A, MD   senna-docusate (Senokot-S) tablet 1 tablet, 1 tablet, Oral, QHS PRN, Mansy, Jan A, MD   theophylline (UNIPHYL) 400 MG 24 hr tablet 400 mg, 400 mg, Oral, Daily, Mansy, Jan A, MD   traZODone (DESYREL) tablet 25 mg, 25 mg, Oral, QHS PRN, Mansy, Vernetta Honey, MD   Exam: Current vital signs: BP (!) 132/59 (BP Location: Left Arm)   Pulse 85   Temp 97.9 F (36.6 C)   Resp 16   Ht 5\' 9"  (1.753 m)   Wt 71.9 kg   SpO2 91%   BMI 23.42 kg/m  Vital signs in last 24 hours: Temp:  [97.7 F (36.5 C)-100.1 F (37.8 C)] 97.9 F (36.6 C) (06/28 0835) Pulse Rate:  [85-113] 85 (06/28 0835) Resp:  [16-35] 16 (06/28 0835) BP: (114-157)/(59-73) 132/59 (06/28 0835) SpO2:  [89 %-97 %] 91 % (06/28 0835) Weight:  [71.9 kg] 71.9 kg (06/27 1823) General: Somewhat cachectic appearing, awake HEENT: Normocephalic atraumatic Lungs: Wheezing and Rales all over Abdomen nondistended nontender  Neurological exam He is awake.  Able to follow simple commands.  Extremely hoarse and dysarthric.  Able to say his first name but unable to tell me the month or her age.  Unable to repeat. Cranial  nerves II to XII: Pupils appear equal round reactive to light, actively resists eye opening but does seem to have a rightward gaze preference, does not blink to threat consistently from the left, blinks to threat consistently from the right, difficult to ascertain facial symmetry due to excessive facial hair but there might be some left-sided facial weakness at rest. Motor examination with flaccid left upper extremity.  Left lower extremity is 1-2/5  in strength.  Right upper extremity is full strength but his attention concentration is poor and he does not keep the arm up for full 10 seconds.  Right leg also seems to have decent strength which she does not seem to keep up for 5 seconds and it falls to bed. Sensation: Grimaces and withdraws to noxious stimulation appropriately bilaterally. Coordination difficult to assess given his mentation  NIHSS 1a Level of Conscious.: 0 1b LOC Questions: 2 1c LOC Commands: 0 2 Best Gaze: 1 3 Visual: 2 4 Facial Palsy: 1 5a Motor Arm - left: 4 5b Motor Arm - Right: 1 6a Motor Leg - Left: 2 6b Motor Leg - Right: 1 7 Limb Ataxia: 0 8 Sensory: 0 9 Best Language: 2 10 Dysarthria: 2 11 Extinct. and Inatten.: 1 TOTAL: 19   Labs I have reviewed labs in epic and the results pertinent to this consultation are: CBC    Component Value Date/Time   WBC 15.0 (H) 02/17/2023 2343   RBC 3.42 (L) 02/08/2023 2343   HGB 9.9 (L) 02/06/2023 2343   HGB 12.5 (L) 02/10/2014 0507   HCT 31.6 (L) 02/19/2023 2343   HCT 39.1 (L) 02/10/2014 0507   PLT 122 (L) 02/19/2023 2343   PLT 151 02/10/2014 0507   MCV 92.4 02/09/2023 2343   MCV 92 02/10/2014 0507   MCH 28.9 02/04/2023 2343   MCHC 31.3 02/10/2023 2343   RDW 13.6 02/25/2023 2343   RDW 14.6 (H) 02/10/2014 0507   LYMPHSABS 1.4 02/23/2023 2343   LYMPHSABS 0.7 (L) 02/10/2014 0507   MONOABS 1.1 (H) 02/13/2023 2343   MONOABS 0.7 02/10/2014 0507   EOSABS 0.1 02/26/2023 2343   EOSABS 0.0 02/10/2014 0507   BASOSABS 0.1  02/04/2023 2343   BASOSABS 0.0 02/10/2014 0507    CMP     Component Value Date/Time   NA 141 02/07/2023 2343   NA 136 02/10/2014 0507   K 3.3 (L) 02/23/2023 2343   K 4.6 02/10/2014 0507   CL 107 02/11/2023 2343   CL 98 02/10/2014 0507   CO2 22 02/04/2023 2343   CO2 31 02/10/2014 0507   GLUCOSE 77 02/21/2023 2343   GLUCOSE 181 (H) 02/10/2014 0507   BUN 17 02/07/2023 2343   BUN 21 (H) 02/10/2014 0507   CREATININE 0.98 02/04/2023 2343   CREATININE 0.61 02/10/2014 0507   CALCIUM 7.5 (L) 02/22/2023 2343   CALCIUM 8.2 (L) 02/10/2014 0507   PROT 5.2 (L) 02/07/2023 2343   PROT 5.5 (L) 02/03/2014 0614   ALBUMIN 2.6 (L) 02/25/2023 2343   ALBUMIN 1.9 (L) 02/03/2014 0614   AST 26 02/17/2023 2343   AST 15 02/03/2014 0614   ALT 14 02/20/2023 2343   ALT 16 02/03/2014 0614   ALKPHOS 62 02/16/2023 2343   ALKPHOS 49 02/03/2014 0614   BILITOT 1.0 02/05/2023 2343   BILITOT 0.3 02/03/2014 0614   GFRNONAA >60 02/04/2023 2343   GFRNONAA >60 02/10/2014 0507   GFRAA >60 11/13/2019 0520   GFRAA >60 02/10/2014 0507    Lipid Panel     Component Value Date/Time   CHOL 204 (H) 02/25/2023 0156   TRIG 128 02/25/2023 0156   HDL 33 (L) 02/25/2023 0156   CHOLHDL 6.2 02/25/2023 0156   VLDL 26 02/25/2023 0156   LDLCALC 145 (H) 02/25/2023 0156    A1c pending  Imaging I have reviewed the images obtained:  CT-head large right hemispheric acute infarct CT angiography head and neck: Acute occlusion of the cervical right  ICA just distal to the bifurcation with distal reconstitution via the ophthalmic artery.  Right MCA and its distal branches are attenuated but remain patent.  Focal filling defect involving the anterior communicating artery complex and proximal right A2 segment likely subocclusive thrombus.  Right ACA is attenuated but patent distally.  Large evolving right MCA and ACA distribution infarct.  6 mm left MCA bifurcation aneurysm.  Severe emphysema with irregular posttreatment changes  within the left upper lobe similar to prior CT.  Superimposed hazy and patchy opacities favored to reflect edema or atelectasis.  Enlarged mediastinal and hilar lymphadenopathy on the right.  MRI examination of the brain-large evolving right ACA and MCA distribution infarcts.  Additional patchy small volume infarcts in the contralateral left cerebral hemisphere, left thalamus and bilateral cerebellar hemispheres.  No associated hemorrhage or mass effect.  Loss of normal flow void within the right ICA to the siphon consistent with previously identified occlusion of the right ICA on the CT angiography head and neck.  Underlying age-related atrophy and small vessel disease.  CT angio chest PE protocol: Nonocclusive segmental and subsegmental pulmonary emboli to the right lower lobe.  No findings of right heart strain at this time.  Increased lymphadenopathy in the mediastinum and right hilar nodal stations concerning for potential metastatic disease in this patient with a history of lung cancer.  Repeat PET/CT should be considered if appropriate.  Increasing areas of airspace consolidation in the right lung most severe in the right middle and lower lobes concerning for probable pneumonia.  Diffuse bronchial wall thickening and moderate centrilobular and paraseptal emphysema-suggestive of underlying COPD.  Aortic atherosclerosis in addition to left main and left anterior descending coronary artery disease.  Thickening and calcification of the aortic valve.  Assessment:  67 year old man with a history of lung cancer status post SBRT, COPD, DVT on anticoagulation at home, dysphagia headaches peripheral vascular disease presented after being found down. Last known well was 2 days prior to presentation when he had already complained of some vision difficulties. CT head reveals large right ACA and MCA territory evolving infarct.  MRI confirms infarct.  CT angiography head and neck consistent with occlusion of the  right internal carotid artery cervical portion just distal to the bifurcation along with continuation of the right MCA branches.  Also focal filling defect in the anterior communicating artery complex and proximal right A2 segment with a subocclusive thrombus which likely caused the strokes. Etiology of this is likely underlying hypercoagulability versus a cardioembolic source. His advanced cancer, as well as hypercoagulable state as evidenced by DVT and now PEs on CT angio chest as well as the large stroke does warrant goals of care discussions with family.  Impression:  Acute ischemic stroke involving the right ACA and right MCA territory-likely emboli from hypercoagulability versus cardioembolic source. History of lung cancer COPD   Recommendations: Large size stroke of about 2 days duration requires close neuromonitoring on the progressive floor. Telemetry Frequent neurochecks and vitals 2D echo A1c Lipid panel Prophylactic treatment: He was on anticoagulation at home.  Given the large size of the stroke, anticoagulation puts him at risk of hemorrhagic conversion but if that has to be used for PE, I would recommend using heparin drip stroke protocol and obtaining a CT head in case of any neurochange for the concern of bleed. PT OT speech therapy I would definitely run this case by oncology to see what recommendations they have from a PE and cancer standpoint and have goals of  care conversations for which I would recommend palliative medicine consultation Avoid hypotension.  If he is to be on anticoagulants, to have a blood pressure can also be detrimental in lead to hemorrhage.  He is over 3 days from his last known normal so he does not necessarily need permissive hypertension but the carotid occlusion may warrant somewhat of high blood pressure range.  For now, maintain systolic blood pressures in the 120-160 range.  Preliminary plan relayed via secure chat to Dr. Clide Dales and discussed  over the phone later on.   -- Milon Dikes, MD Neurologist [facility] Pager: [phone]

## 2023-02-25 NOTE — Evaluation (Addendum)
Physical Therapy Evaluation Patient Details Name: Steve Gregory MRN: [id number] DOB: Oct 04, 1955 Today's Date: 02/25/2023  History of Present Illness  67 y/o male presented to ED on 02/22/2023 after being found unresponsive in home. Found to have incomprehensible speech and R sided gaze. MRI found large evolving R ACA and MCA infarcts. PMH: anxiety, depression, HTN, emphysema, PVD, Barrett's esophagus  Clinical Impression  Patient admitted with the above. Per chart review, patient lives alone and was independent and driving PTA. Patient presents with L sided weakness, impaired sitting balance, impaired coordination, decreased activity tolerance, and impaired cognition. Patient requires totalA+2 for bed mobility this date with maxA to maintain sitting balance 2/2 pushing to L and anterior lean. Max multimodal cues required for patient to follow commands ~25% of time. Able to state "No" and "You wore me out" during session. Patient will benefit from skilled PT services during acute stay to address listed deficits. Patient will benefit from ongoing therapy at discharge to maximize functional independence and safety.        Recommendations for follow up therapy are one component of a multi-disciplinary discharge planning process, led by the attending physician.  Recommendations may be updated based on patient status, additional functional criteria and insurance authorization.  Follow Up Recommendations Can patient physically be transported by private vehicle: No     Assistance Recommended at Discharge Frequent or constant Supervision/Assistance  Patient can return home with the following  Two people to help with walking and/or transfers;Two people to help with bathing/dressing/bathroom;Assistance with cooking/housework;Assistance with feeding;Direct supervision/assist for medications management;Direct supervision/assist for financial management;Assist for transportation;Help with stairs or ramp for  entrance    Equipment Recommendations Other (comment) (TBD at next venue of care)  Recommendations for Other Services       Functional Status Assessment Patient has had a recent decline in their functional status and demonstrates the ability to make significant improvements in function in a reasonable and predictable amount of time.     Precautions / Restrictions Precautions Precautions: Fall Precaution Comments: L hemi, pushes to L Restrictions Weight Bearing Restrictions: No      Mobility  Bed Mobility Overal bed mobility: Needs Assistance Bed Mobility: Supine to Sit, Sit to Supine     Supine to sit: Total assist, +2 for physical assistance, +2 for safety/equipment Sit to supine: Total assist, +2 for physical assistance, +2 for safety/equipment   General bed mobility comments: lethargic, not following commands to complete bed mobility    Transfers                   General transfer comment: deferred    Ambulation/Gait                  Stairs            Wheelchair Mobility    Modified Rankin (Stroke Patients Only)       Balance Overall balance assessment: Needs assistance Sitting-balance support: Single extremity supported, Feet supported Sitting balance-Leahy Scale: Poor Sitting balance - Comments: pushes to L but with assistance and preoccupying R hand, pushing minimizes. Requires maxA to maintain upright sitting balance 2/2 anterior lean Postural control: Left lateral lean, Other (comment) (anterior lean)                                   Pertinent Vitals/Pain Pain Assessment Pain Assessment: Faces Faces Pain Scale: Hurts little more Pain Location: neck with  movement Pain Descriptors / Indicators: Grimacing Pain Intervention(s): Monitored during session, Repositioned    Home Living Family/patient expects to be discharged to:: Private residence Living Arrangements: Alone                 Additional  Comments: unable to obtain home setup 2/2 expressive difficulties and lethargy    Prior Function Prior Level of Function : Independent/Modified Independent;Driving             Mobility Comments: per chart review, patient independent living alone and driving       Hand Dominance        Extremity/Trunk Assessment   Upper Extremity Assessment Upper Extremity Assessment: Defer to OT evaluation    Lower Extremity Assessment Lower Extremity Assessment: LLE deficits/detail;Difficult to assess due to impaired cognition LLE Deficits / Details: with max multimodal cueing, minimal movement noted. 2-/5    Cervical / Trunk Assessment Cervical / Trunk Assessment: Kyphotic  Communication   Communication: Expressive difficulties  Cognition Arousal/Alertness: Lethargic Behavior During Therapy: Flat affect Overall Cognitive Status: No family/caregiver present to determine baseline cognitive functioning                                 General Comments: oriented to self. Follows simple commands ~25% of the time with max multimodal cueing . Minimal verbalizations beside "No" and "you wore me out"        General Comments General comments (skin integrity, edema, etc.): on 4L O2, spO2 97% and HR low 100s while sitting EOB    Exercises     Assessment/Plan    PT Assessment Patient needs continued PT services  PT Problem List Decreased strength;Decreased activity tolerance;Decreased balance;Decreased mobility;Decreased coordination;Decreased cognition;Decreased knowledge of use of DME;Decreased safety awareness;Decreased knowledge of precautions;Cardiopulmonary status limiting activity       PT Treatment Interventions DME instruction;Gait training;Functional mobility training;Therapeutic activities;Therapeutic exercise;Balance training;Patient/family education;Wheelchair mobility training;Neuromuscular re-education    PT Goals (Current goals can be found in the Care Plan  section)  Acute Rehab PT Goals Patient Stated Goal: unable to state PT Goal Formulation: Patient unable to participate in goal setting Time For Goal Achievement: 03/11/23 Potential to Achieve Goals: Fair    Frequency Min 4X/week     Co-evaluation PT/OT/SLP Co-Evaluation/Treatment: Yes Reason for Co-Treatment: Complexity of the patient's impairments (multi-system involvement);Necessary to address cognition/behavior during functional activity;For patient/therapist safety;To address functional/ADL transfers PT goals addressed during session: Mobility/safety with mobility;Balance         AM-PAC PT "6 Clicks" Mobility  Outcome Measure Help needed turning from your back to your side while in a flat bed without using bedrails?: Total Help needed moving from lying on your back to sitting on the side of a flat bed without using bedrails?: Total Help needed moving to and from a bed to a chair (including a wheelchair)?: Total Help needed standing up from a chair using your arms (e.g., wheelchair or bedside chair)?: Total Help needed to walk in hospital room?: Total Help needed climbing 3-5 steps with a railing? : Total 6 Click Score: 6    End of Session Equipment Utilized During Treatment: Oxygen Activity Tolerance: Patient limited by lethargy Patient left: in bed;with call bell/phone within reach;with bed alarm set Nurse Communication: Mobility status PT Visit Diagnosis: Unsteadiness on feet (R26.81);Muscle weakness (generalized) (M62.81);Difficulty in walking, not elsewhere classified (R26.2);Other abnormalities of gait and mobility (R26.89);Other symptoms and signs involving the nervous system (R29.898);Hemiplegia and  hemiparesis Hemiplegia - Right/Left: Left Hemiplegia - caused by: Cerebral infarction    Time: 1610-9604 PT Time Calculation (min) (ACUTE ONLY): 24 min   Charges:   PT Evaluation $PT Eval Moderate Complexity: 1 Mod          Maylon Peppers, PT, DPT Physical  Therapist - [place]  [facility]   Izyan Ezzell A Nekia Maxham 02/25/2023, 9:54 AM

## 2023-02-25 NOTE — Progress Notes (Signed)
PHARMACY - PHYSICIAN COMMUNICATION CRITICAL VALUE ALERT - BLOOD CULTURE IDENTIFICATION (BCID)  Results for orders placed or performed during the hospital encounter of 01/31/2023  Blood Culture (routine x 2)     Status: None (Preliminary result)   Collection Time: 02/17/2023  6:14 PM   Specimen: BLOOD  Result Value Ref Range Status   Specimen Description BLOOD BLOOD RIGHT HAND  Final   Special Requests   Final    BOTTLES DRAWN AEROBIC AND ANAEROBIC BLOOD RIGHT HAND   Culture  Setup Time   Final    GRAM POSITIVE RODS ANAEROBIC BOTTLE ONLY CRITICAL RESULT CALLED TO, READ BACK BY AND VERIFIED WITH: Ecko Beasley BELEUE @0337  ON 02/25/23 SKL Performed at [facility], [address] [place], [place] [address]    Culture PENDING  Incomplete   Report Status PENDING  Incomplete  SARS Coronavirus 2 by RT PCR (hospital order, performed in [facility] hospital lab) *cepheid single result test* Anterior Nasal Swab     Status: None   Collection Time: 01/31/2023  8:57 PM   Specimen: Anterior Nasal Swab  Result Value Ref Range Status   SARS Coronavirus 2 by RT PCR NEGATIVE NEGATIVE Final    Comment: (NOTE) SARS-CoV-2 target nucleic acids are NOT DETECTED.  The SARS-CoV-2 RNA is generally detectable in upper and lower respiratory specimens during the acute phase of infection. The lowest concentration of SARS-CoV-2 viral copies this assay can detect is 250 copies / mL. A negative result does not preclude SARS-CoV-2 infection and should not be used as the sole basis for treatment or other patient management decisions.  A negative result may occur with improper specimen collection / handling, submission of specimen other than nasopharyngeal swab, presence of viral mutation(s) within the areas targeted by this assay, and inadequate number of viral copies (<250 copies / mL). A negative result must be combined with clinical observations, patient history, and epidemiological information.  Fact Sheet for  Patients:   RoadLapTop.co.za  Fact Sheet for Healthcare Providers: http://kim-miller.com/  This test is not yet approved or  cleared by the [place] FDA and has been authorized for detection and/or diagnosis of SARS-CoV-2 by FDA under an Emergency Use Authorization (EUA).  This EUA will remain in effect (meaning this test can be used) for the duration of the COVID-19 declaration under Section 564(b)(1) of the Act, 21 U.S.C. section 360bbb-3(b)(1), unless the authorization is terminated or revoked sooner.  Performed at [facility], [address] [place], [place] [address]     BCID Results: 1 (anaerobic) bottle of 4 w/ GPR, being sent to [facility] for further identification.  Pt currently on Azithromycin & Ceftriaxone.  Name of provider contacted: N/A   Changes to prescribed antibiotics required: Will hold off on notifying provider, pending additional BCID results.  Otelia Sergeant, PharmD, [facility] 02/25/2023 4:06 AM

## 2023-02-25 NOTE — Progress Notes (Signed)
Patient received from the ED. Patient non-responsive and lethargic. Patient with eyes closed and does not answer questions. SN unable to complete admission screening. No family at bedside at this time.

## 2023-02-25 NOTE — Consult Note (Addendum)
Consultation Note Date: 02/25/2023 at 1145  Patient Name: Steve Gregory  DOB: 11-Jul-1956  MRN: [id number]  Age / Sex: 67 y.o., male  PCP: Lauro Regulus, MD Referring Physician: Marcelino Duster, MD  Reason for Consultation: Establishing goals of care  HPI/Patient Profile: 67 y.o. male  with past medical history of lung cancer, COPD, DVT, dysphagia, seizures, PVD, and chronic dyspnea admitted on 02/04/2023 with AMS.  Upon arrival to ED, CT of head revealed large right hemispheric infarct.  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  CT of chest revealed PE of right lower lobe.  Patient is being followed by neurology.  PMT was consulted to discuss goals of care in light of patient's recent stroke.   Clinical Assessment and Goals of Care: I have reviewed medical records including EPIC notes, labs and imaging, assessed the patient and then met with patient, his brother Steve Gregory, and Steve Gregory's wife Steve Gregory at bedside to discuss diagnosis prognosis, GOC, EOL wishes, disposition and options.  I introduced Palliative Medicine as specialized medical care for people living with serious illness. It focuses on providing relief from the symptoms and stress of a serious illness. The goal is to improve quality of life for both the patient and the family.  We discussed a brief life review of the patient.  Patient worked for [employer] the majority of his adult career.  He is divorced and has 2 children.  According to Steve Gregory, 1 child passed away and his other child is estranged.  Patient and Steve Gregory are only living siblings of 6 children from his family.  Steve Gregory shares that patient retired early due to significant health conditions and mostly kept to himself over the last few years.  As far as functional and nutritional status Steve Gregory endorses complete independence for [place] prior to this stroke.   Patient was having difficulty walking but was able to perform ADLs independently.  We discussed patient's current illness (significant infarct) and what it means in the larger context of patient's on-going co-morbidities (seizures, DVT, COPD). Discussed careful balance of anticoagulations for avoiding clots, strokes, and hemorrhaging.  Reviewed that functional, nutritional, and cognitive status discussed is significant indicators of patient's overall prognosis.  Reviewed that PT, OT, and SLP are following closely for patient's progress.  Surrogate decision maker reviewed.  Steve Gregory endorses that patient's adult child has not been part of patient's life for several years.  Patient has not created a healthcare power of attorney or living will.  In the absence of an HCPOA or spouse, patient's reasonably available adult children would be NOK. However, Steve Gregory does not know how to reach patient's son as he has been estranged and had no contact with patient for several years. Thus, patient's reasonably available sibling-Steve Gregory-would then become patient's decision maker.  I attempted to elicit values and goals of care important to the patient.  Steve Gregory shares in the past that patient has conveyed he would want resuscitative efforts.  In light of patient's current medical condition, Steve Gregory is now questioning  whether CODE STATUS should remain full code.  He has extensive history with other family members who have been placed on ventilators as well as allowed natural passing.  Details of DNR versus full code discussed.  Steve Gregory would like more time to consider patient's CODE STATUS.  Encouraged family to consider DNR/DNI status understanding evidenced based poor outcomes in similar hospitalized patients, as the cause of the arrest is likely associated with chronic/terminal disease rather than a reversible acute cardio-pulmonary event.    Symptoms assessed. Pt is NAD. He is unable to make his wishes or concerns  known but he has no non-verbal signs of pain/discomfort. Family shares that patient has denied pain when more alert earlier today. No adjustment to [facility] needed at this time.   Discussed with patient/family the importance of continued conversation with family and the medical providers regarding overall plan of care and treatment options, ensuring decisions are within the context of the patient's values and GOCs.    Steve Gregory expressed the need to watch and see how patient is doing over the next few days. Time for outcomes needed.   Questions and concerns were addressed. The family was encouraged to call with questions or concerns.   PMT will continue to follow.   Primary Decision Maker NEXT OF KIN  Physical Exam Vitals reviewed.  Constitutional:      General: He is not in acute distress.    Appearance: He is not ill-appearing.  HENT:     Head: Normocephalic.     Mouth/Throat:     Mouth: Mucous membranes are moist.  Cardiovascular:     Rate and Rhythm: Normal rate.  Pulmonary:     Effort: Pulmonary effort is normal.  Abdominal:     Palpations: Abdomen is soft.  Musculoskeletal:     Comments: Left sided weakness, generalized weakness     Palliative Assessment/Data: 30%     Thank you for this consult. Palliative medicine will continue to follow and assist holistically.   Time Total: 75 minutes Greater than 50%  of this time was spent counseling and coordinating care related to the above assessment and plan.  Signed by: Georgiann Cocker, DNP, FNP-BC Palliative Medicine    Please contact Palliative Medicine Team phone at [phone] for questions and concerns.  For individual provider: See Loretha Stapler

## 2023-02-25 NOTE — Evaluation (Signed)
Occupational Therapy Evaluation Patient Details Name: Steve Gregory MRN: [id number] DOB: Jan 21, 1956 Today's Date: 02/25/2023   History of Present Illness 67 y/o male presented to ED on 02/23/2023 after being found unresponsive in home. Found to have incomprehensible speech and R sided gaze. MRI found large evolving R ACA and MCA infarcts. PMH: anxiety, depression, HTN, emphysema, PVD, Barrett's esophagus   Clinical Impression   Steve Gregory was seen for OT/PT co-evaluation this date. Prior to hospital admission, pt was IND per chart. Pt follows all commands to move R side of body, difficulty answering questions and following further commands - ?cognition vs vision vs alertness vs aphasia. Pt currently requires TOTAL A x2 sup<>sit. MAX A x2 static sitting balance. Unable to trial seated tasks as pt pushes heavily to anterior L with RUE in sitting. Pt would benefit from skilled OT to address noted impairments and functional limitations (see below for any additional details). Upon hospital discharge, recommend OT follow up.    Recommendations for follow up therapy are one component of a multi-disciplinary discharge planning process, led by the attending physician.  Recommendations may be updated based on patient status, additional functional criteria and insurance authorization.   Assistance Recommended at Discharge Frequent or constant Supervision/Assistance  Patient can return home with the following Two people to help with walking and/or transfers;Two people to help with bathing/dressing/bathroom;Help with stairs or ramp for entrance    Functional Status Assessment  Patient has had a recent decline in their functional status and demonstrates the ability to make significant improvements in function in a reasonable and predictable amount of time.  Equipment Recommendations  Other (comment) (defer)    Recommendations for Other Services       Precautions / Restrictions Precautions Precautions:  Fall Precaution Comments: L hemi, pushes to L Restrictions Weight Bearing Restrictions: No      Mobility Bed Mobility Overal bed mobility: Needs Assistance Bed Mobility: Supine to Sit, Sit to Supine     Supine to sit: Total assist, +2 for physical assistance, +2 for safety/equipment Sit to supine: Total assist, +2 for physical assistance, +2 for safety/equipment   General bed mobility comments: dififculty keeping eyes open    Transfers                   General transfer comment: unsafe to attempt      Balance Overall balance assessment: Needs assistance Sitting-balance support: Single extremity supported, Feet supported Sitting balance-Leahy Scale: Zero Sitting balance - Comments: heavy anterior + L lean with no righting reactions Postural control: Left lateral lean (anterior)                                 ADL either performed or assessed with clinical judgement   ADL Overall ADL's : Needs assistance/impaired                                       General ADL Comments: MAX A don B socks at bed level. Unable to trial seated groming tasks as pt pushes heavily to L with RUE in sitting. SETUP face washing bed level using RUE only.      Pertinent Vitals/Pain Pain Assessment Pain Assessment: Faces Faces Pain Scale: Hurts little more Pain Location: neck with movement Pain Descriptors / Indicators: Grimacing Pain Intervention(s): Limited activity within patient's tolerance, Repositioned  Hand Dominance     Extremity/Trunk Assessment Upper Extremity Assessment Upper Extremity Assessment: RUE deficits/detail;LUE deficits/detail RUE Deficits / Details: WFL, pushes strongly through R side LUE Deficits / Details: 0/5 grossly   Lower Extremity Assessment Lower Extremity Assessment: Defer to PT evaluation LLE Deficits / Details: with max multimodal cueing, minimal movement noted. 2-/5   Cervical / Trunk Assessment Cervical /  Trunk Assessment: Kyphotic   Communication Communication Communication: Expressive difficulties   Cognition Arousal/Alertness: Lethargic Behavior During Therapy: Flat affect Overall Cognitive Status: Impaired/Different from baseline Area of Impairment: Orientation, Following commands                 Orientation Level: Person     Following Commands: Follows one step commands inconsistently       General Comments: follows all commands to move R side of body. Difficulty maintaining eyes open and minimally verbal t/o (states "no" and "wore out" in response to questions). ?cognition vs vision vs alertness vs aphasia     General Comments  on 4L O2, spO2 97% and HR low 100s while sitting EOB            Home Living Family/patient expects to be discharged to:: Private residence Living Arrangements: Alone                               Additional Comments: unable to obtain home setup 2/2 expressive difficulties and lethargy      Prior Functioning/Environment Prior Level of Function : Independent/Modified Independent;Driving             Mobility Comments: per chart review, patient independent living alone and driving          OT Problem List: Decreased strength;Decreased range of motion;Decreased activity tolerance;Impaired balance (sitting and/or standing);Decreased coordination;Impaired vision/perception;Decreased safety awareness;Impaired UE functional use      OT Treatment/Interventions: Self-care/ADL training;Therapeutic exercise;Energy conservation;DME and/or AE instruction;Therapeutic activities;Neuromuscular education;Balance training;Patient/family education;Visual/perceptual remediation/compensation    OT Goals(Current goals can be found in the care plan section) Acute Rehab OT Goals Patient Stated Goal: unable to state OT Goal Formulation: Patient unable to participate in goal setting Time For Goal Achievement: 03/11/23 Potential to Achieve  Goals: Fair ADL Goals Pt Will Perform Grooming: with min assist;bed level Pt Will Perform Upper Body Dressing: with mod assist;sitting Pt Will Transfer to Toilet: with max assist;squat pivot transfer;bedside commode Pt/caregiver will Perform Home Exercise Program: Increased strength;Left upper extremity;With minimal assist  OT Frequency: Min 2X/week    Co-evaluation PT/OT/SLP Co-Evaluation/Treatment: Yes Reason for Co-Treatment: Complexity of the patient's impairments (multi-system involvement);Necessary to address cognition/behavior during functional activity;For patient/therapist safety;To address functional/ADL transfers PT goals addressed during session: Mobility/safety with mobility;Balance OT goals addressed during session: ADL's and self-care      AM-PAC OT "6 Clicks" Daily Activity     Outcome Measure Help from another person eating meals?: A Lot Help from another person taking care of personal grooming?: A Lot Help from another person toileting, which includes using toliet, bedpan, or urinal?: Total Help from another person bathing (including washing, rinsing, drying)?: A Lot Help from another person to put on and taking off regular upper body clothing?: A Lot Help from another person to put on and taking off regular lower body clothing?: Total 6 Click Score: 10   End of Session Nurse Communication: Mobility status  Activity Tolerance: Patient limited by lethargy Patient left: in bed;with bed alarm set;with call bell/phone within reach  OT  Visit Diagnosis: Other abnormalities of gait and mobility (R26.89);Muscle weakness (generalized) (M62.81);Hemiplegia and hemiparesis Hemiplegia - Right/Left: Left Hemiplegia - caused by: Cerebral infarction                Time: 1610-9604 OT Time Calculation (min): 24 min Charges:  OT General Charges $OT Visit: 1 Visit OT Evaluation $OT Eval Moderate Complexity: 1 Mod  Kathie Dike, M.S. OTR/L  02/25/23, 10:09 AM  ascom  [phone]

## 2023-02-26 ENCOUNTER — Inpatient Hospital Stay
Admit: 2023-02-26 | Discharge: 2023-02-26 | Disposition: A | Discharge status: Home / Self Care | Payer: Medicare Other | Attending: Internal Medicine | Admitting: Internal Medicine

## 2023-02-26 ENCOUNTER — Inpatient Hospital Stay: Payer: Medicare Other

## 2023-02-26 DIAGNOSIS — K219 Gastro-esophageal reflux disease without esophagitis: Secondary | ICD-10-CM | POA: Diagnosis not present

## 2023-02-26 DIAGNOSIS — I2699 Other pulmonary embolism without acute cor pulmonale: Secondary | ICD-10-CM | POA: Diagnosis not present

## 2023-02-26 DIAGNOSIS — A419 Sepsis, unspecified organism: Secondary | ICD-10-CM | POA: Diagnosis not present

## 2023-02-26 DIAGNOSIS — I639 Cerebral infarction, unspecified: Secondary | ICD-10-CM | POA: Diagnosis not present

## 2023-02-26 LAB — CBC
HCT: 33.1 % — ABNORMAL LOW (ref 39.0–52.0)
Hemoglobin: 10.7 g/dL — ABNORMAL LOW (ref 13.0–17.0)
MCH: 29.2 pg (ref 26.0–34.0)
MCHC: 32.3 g/dL (ref 30.0–36.0)
MCV: 90.4 fL (ref 80.0–100.0)
Platelets: 120 10*3/uL — ABNORMAL LOW (ref 150–400)
RBC: 3.66 MIL/uL — ABNORMAL LOW (ref 4.22–5.81)
RDW: 13.7 % (ref 11.5–15.5)
WBC: 14.9 10*3/uL — ABNORMAL HIGH (ref 4.0–10.5)
nRBC: 0 % (ref 0.0–0.2)

## 2023-02-26 LAB — CULTURE, BLOOD (ROUTINE X 2)

## 2023-02-26 LAB — BASIC METABOLIC PANEL
Anion gap: 10 (ref 5–15)
BUN: 17 mg/dL (ref 8–23)
CO2: 20 mmol/L — ABNORMAL LOW (ref 22–32)
Calcium: 8.5 mg/dL — ABNORMAL LOW (ref 8.9–10.3)
Chloride: 109 mmol/L (ref 98–111)
Creatinine, Ser: 0.93 mg/dL (ref 0.61–1.24)
GFR, Estimated: >60 mL/min (ref >60)
Glucose, Bld: 107 mg/dL — ABNORMAL HIGH (ref 70–99)
Potassium: 3.8 mmol/L (ref 3.5–5.1)
Sodium: 139 mmol/L (ref 135–145)

## 2023-02-26 LAB — GLUCOSE, CAPILLARY
Glucose-Capillary: 103 mg/dL — ABNORMAL HIGH (ref 70–99)
Glucose-Capillary: 104 mg/dL — ABNORMAL HIGH (ref 70–99)
Glucose-Capillary: 108 mg/dL — ABNORMAL HIGH (ref 70–99)
Glucose-Capillary: 110 mg/dL — ABNORMAL HIGH (ref 70–99)
Glucose-Capillary: 118 mg/dL — ABNORMAL HIGH (ref 70–99)

## 2023-02-26 LAB — APTT
aPTT: 71 seconds — ABNORMAL HIGH (ref 24–36)
aPTT: 75 seconds — ABNORMAL HIGH (ref 24–36)

## 2023-02-26 LAB — HEPARIN LEVEL (UNFRACTIONATED): Heparin Unfractionated: 0.67 IU/mL (ref 0.30–0.70)

## 2023-02-26 LAB — HEMOGLOBIN A1C
Hgb A1c MFr Bld: 6 % — ABNORMAL HIGH (ref 4.8–5.6)
Mean Plasma Glucose: 126 mg/dL

## 2023-02-26 NOTE — Progress Notes (Signed)
Progress Note   Patient: Steve Gregory:[id number] DOB: 24-Aug-1956 DOA: 02/25/2023     2 DOS: the patient was seen and examined on 02/26/2023   Brief hospital course: Steve Gregory is a 67 y.o. Caucasian male with medical history significant for anxiety, depression, GERD, essential hypertension, emphysema, peripheral vascular disease, Barrett's esophagus, anxiety and osteoarthritis, who presented to the emergency room with acute onset of left-sided weakness with expressive dysphasia of questionable onset.  The patient's brother stated that on Tuesday he told him he is eyes were crossed and he had double vision.  He was recently diagnosed with left lower extremity DVT and placed on Eliquis.    Noncontrasted head CT scan showed large right MCA territory acute infarction with no hemorrhage or mass effect. 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 Focal filling defects involving the anterior communicating artery complex and proximal right A2 segment, likely subocclusive thrombus. Large evolving right MCA/ACA distribution infarct. MRI brain confirmed the findings. CTA chest showed - segmental and subsegmental PE to the right lower lobe, increasing mediastinal lymphadenopathy and right hilar nodal stations concerning for potential metastatic disease, increasing areas of airspace consolidation right lung most severe in right middle and lower lobes concerning for pneumonia, diffuse bronchial wall thickening, aortic atherosclerosis and calcifications of attic valve.  Assessment and Plan: * Acute cerebral infarction [name] Acute MCA/ACA territory infarction with subsequent left-sided hemiplegia. Continue neurochecks as per protocol.  His mental status is poor. Continue telemetry. Continue aspirin, statin therapy. Neurology follow up appreciated. Repeat CT increased mass effect 5mm midline shift. Overall prognosis poor. PT OT once able to engage. Speech  therapist advised N.p.o. for now. Hold oral home medications. Palliative discussions with family.  Sepsis due to undetermined organism [facility]) Right middle and lower lobe pneumonia  CTA chest reveals pneumonia.  Possibly due to aspiration. Continue IV Rocephin and Zithromax for the possibility of underlying pneumonia. Blood culture likely contamination.  Repeat blood cultures ordered. Continue with gentle IV fluids as he is NPO. Monitor vitals closely. Supplemental oxygen as need to maintain saturation above 92%.  Right-sided pulmonary embolism History of deep venous thrombosis (DVT) of distal vein of left lower extremity. Continue heparin drip per pharmacy protocol. Continue to monitor APTT.  GERD without esophagitis Continue IV PPI.  History of lung cancer status post SBRT Chronic obstructive pulmonary disease (COPD) (HCC) Continue DuoNebs, home inhalers. CTA chest revealed worsening lymphadenopathy potential metastatic disease.  Oncology recommended PET/CT once he is more stable.  DVT prophylaxis-on heparin drip CODE STATUS full code.  Palliative team on board for goals of care discussion.      Subjective: Patient is seen and examined today morning. He is sleepy and lethargic. Breathing heavy, left sided weakness, on 4L supplemental oxygen.  Physical Exam: Vitals:   02/25/23 2330 02/26/23 0419 02/26/23 0841 02/26/23 1157  BP: (!) 152/70 (!) 144/70 (!) 141/68 (!) 155/75  Pulse: (!) 104 (!) 104 (!) 103 (!) 108  Resp: 20 18 20  (!) 22  Temp: 98.4 F (36.9 C) 100.1 F (37.8 C) 99.3 F (37.4 C) 99.9 F (37.7 C)  TempSrc:      SpO2: 95% 92% (!) 84% 93%  Weight:      Height:       General - Elderly Caucasian male, lethargic obtunded. HEENT - PERRLA, EOMI, atraumatic head, non tender sinuses. Lung - Clear, diffuse rhonchi, wheezes. Heart - S1, S2 heard, no murmurs, rubs, trace pedal edema Neuro -  sleepy, lethargic, unable to do full neuro exam. Skin - Warm and dry. Data  Reviewed:  CBC, BMP, aptt, blood sugars, blood/urine cultures  Family Communication: patient's brother update at bedside  Disposition: Status is: Inpatient Remains inpatient appropriate because: large stroke with midline shift  Planned Discharge Destination:  unsure at this time , prognosis poor    MDM level 3- patient has h/o lung cancer, DVT with large stroke, multiple PE. Poor prognosis. He is at high risk for clinical deterioration.   Author: Marcelino Duster, MD 02/26/2023 1:29 PM  For on call review [email or web address].

## 2023-02-26 NOTE — Consult Note (Signed)
ANTICOAGULATION CONSULT NOTE  Pharmacy Consult for Heparin Indication: pulmonary embolus and stroke  Allergies  Allergen Reactions   Chantix [Varenicline] Swelling        Lipitor [Atorvastatin] Swelling   Mobic [Meloxicam] Other (See Comments)    Myalgia   Statins Other (See Comments)    Myalgia    Patient Measurements: Height: 5\' 9"  (175.3 cm) Weight: 71.9 kg (158 lb 9.6 oz) IBW/kg (Calculated) : 70.7 Heparin Dosing Weight: 71.9 kg  Vital Signs: Temp: 98.4 F (36.9 C) (06/28 2330) Temp Source: Oral (06/28 2027) BP: 152/70 (06/28 2330) Pulse Rate: 104 (06/28 2330)  Labs: Recent Labs    02/15/2023 1814 02/08/2023 2343 02/25/23 2001 02/26/23 0104  HGB 12.2* 9.9*  --   --   HCT 37.8* 31.6*  --   --   PLT 140* 122*  --   --   APTT 33 41* 48*  --   LABPROT 17.4* 18.4*  --   --   INR 1.4* 1.5*  --   --   HEPARINUNFRC  --   --  0.64 0.67  CREATININE 1.13 0.98  --   --   CKTOTAL 370  --   --   --      Estimated Creatinine Clearance: 74.1 mL/min (by C-G formula based on SCr of 0.98 mg/dL).   Medical History: Past Medical History:  Diagnosis Date   Allergy    Anxiety    Arthritis    Barrett's esophagus    Bronchitis, chronic (HCC)    Cancer (HCC)    LUNG   COPD (chronic obstructive pulmonary disease) (HCC)    Cough    CHRONIC   Depression    Dizziness    DVT (deep venous thrombosis) (HCC)    Dysphagia    Essential hypertension 06/28/2017   GERD (gastroesophageal reflux disease)    H/O emphysema (HCC)    Headache    History of depression 06/05/2014   Last Assessment & Plan:  Mood is doing well on medications.    History of hiatal hernia    Hoarseness of voice    partial paralyzed vocal cord   HOH (hard of hearing)    Hypertension    Lung mass    Maxillary fracture (HCC)    LEFT SIDE   Orthopnea    Oxygen decrease    H/O HOME USE, NOT NOW   Personal history of colonic polyps    Pneumonia    PSEUDOMONAL PNEUMONIA IN THE PAST   PVD (peripheral  vascular disease) (HCC) 06/05/2014   Overview:  Smoker with poor pulses  Last Assessment & Plan:  No leg pain is noted of late.    Renal failure    PRERENAL   Seizures (HCC)    sounds like it could be vagal at times, coughing can cause him to fade out and see white lights. had last sz 3 months ago when not taking meds   Shortness of breath dyspnea    uses many different inhalers   Squamous cell carcinoma of skin 01/07/2020   right mid volar forearm, mid back spinal   Varicose veins of both lower extremities with pain 06/28/2017   Wheezing    Assessment: CLIFFORD WESTMORELAND is a 67 y.o. male presenting with acute onset of left-sided weakness with expressive dysphasia of questionable onset. PMH significant for anxiety, depression, GERD, HTN, emphysema, PVD, Barrett's esophagus, anxiety, OA. Patient was on apixaban 5 mg BID PTA with last dose thought to be earlier this week.  He was recently diagnosed with left lower extremity DVT and placed on Eliquis. 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 He was on anticoagulation at home. Per neurology, given the large size of the stroke, anticoagulation puts him at risk of hemorrhagic conversion but if that has to be used for PE, and recommended using heparin drip stroke protocol with lower HL/aPTT goals. Pharmacy has been consulted to initiate and manage heparin infusion.   Baseline Labs: aPTT 41, PT 18.4, INR 1.5, Hgb 9.9, Hct 31.6, Plt 122   Goal of Therapy:  Heparin level 03.-0.5 units/ml aPTT 66-85 seconds Monitor platelets by anticoagulation protocol: Yes   Date Time aPTT/HL Rate/Comment  6/28 2001 48/0.64 1200/aPTT subtherapeutic 6/29 0104 71/0.67 1350/aPTT therapeutic x 1  Plan:  No boluses per MD Continue heparin infusion at 1350 units/hr Recheck aPTT in 6 hours Continue to monitor aPTT until HL and aPTT correlate.  Check HL daily for  correlation until HL and aPTT correlate. Switch to HL monitoring once HL and aPTT correlate.  Continue to monitor H&H and platelets daily while on heparin infusion    Otelia Sergeant, PharmD, [facility] 02/26/2023 1:57 AM

## 2023-02-26 NOTE — Progress Notes (Signed)
Speech Language Pathology Treatment: Dysphagia  Patient Details Name: MASSON REMILLARD MRN: [id number] DOB: 1955-12-22 Today's Date: 02/26/2023 Time: 4098-1191 SLP Time Calculation (min) (ACUTE ONLY): 9 min  Assessment / Plan / Recommendation Clinical Impression  Pt with presence of audible pharyngeal secretions. Pt with one cough response to secretions with no swallow appreciated by observation and hyoid palpation after cough. Pt with salvia escaping from left labial area and no response to oral stimulation. Given severity of audible secretions, pt is at a very high risk of developing aspiration from presumed lack of airway protection with his own salvia. Continue to recommend aggressive oral care to reduce bacterial load of salvia. If pt's family should choose an aggressive medical approach to pt's care, suspect that pt will likely need long term nutritional support but even with a PEG, pt's aspiration risk with his own salvia will remain very high. ST to follow while inpatient.     HPI HPI: Pt is a 67 y.o. Caucasian male with medical history significant for lung cancer status post SBRT, COPD, depression, DVT, dysphagia w/ Barrett's Esophagus, anxiety and osteoarthritis, headaches, peripheral vascular disease, documented history of seizures, chronic dyspnea brought in after being found down by family with a last known well sometime on Tuesday, 02/22/2023.  Patient unable to provide history.  Pt presented w/ Left sided weakness and expressive aphasia at admit.  The patient's brother stated that on Tuesday he told him he is eyes were crossed and he had double vision then they did not hear from him for 2 days.  He was recently diagnosed with left lower extremity DVT and placed on Eliquis.     Noncontrasted Head CT scan showed large right MCA territory acute infarction with no hemorrhage or mass effect.  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 Distal  reconstitution via the ophthalmic artery. Right MCA and its distal branches are attenuated but remain patent. Focal filling defects involving the anterior communicating artery complex and proximal right A2 segment, likely subocclusive thrombus. Right ACA is attenuated but patent distally.  Large evolving right MCA/ACA distribution infarct.  MRI brain confirmed the findings.   CXR at admit: Stable radiation changes involving both upper lobes. No definite  acute abnormality is noted. Emphysema.  CTA of Chest following: segmental and subsegmental PE to the right lower lobe, increasing mediastinal lymphadenopathy and right hilar nodal stations concerning for potential metastatic disease, increasing areas of airspace consolidation right lung most severe in right middle and lower lobes concerning for pneumonia, diffuse bronchial wall thickening.  MD spoke to family about the potential metastatic lung disease on 02/25/2023. Marland Kitchen      SLP Plan  Continue with current plan of care  Patient needs continued Speech Lanaguage Pathology Services   Recommendations for follow up therapy are one component of a multi-disciplinary discharge planning process, led by the attending physician.  Recommendations may be updated based on patient status, additional functional criteria and insurance authorization.    Recommendations  Diet recommendations: NPO Medication Administration: Via alternative means                  Oral care QID;Staff/trained caregiver to provide oral care    (TBD) Dysphagia, oropharyngeal phase (R13.12)     Continue with current plan of care   Shelly Shoultz B. Dreama Saa, M.S., CCC-SLP, Tree surgeon Certified Brain Injury Specialist [facility]  [facility] Office [phone] Ascom [phone] Fax [phone]

## 2023-02-26 NOTE — Progress Notes (Signed)
Neurology Progress Note   S:// Seen and examined.  Remains essentially unchanged in terms of clinical exam-if anything appeared a little bit more somnolent than yesterday   O:// Current vital signs: BP (!) 141/68 (BP Location: Left Arm)   Pulse (!) 103   Temp 99.3 F (37.4 C)   Resp 20   Ht 5\' 9"  (1.753 m)   Wt 71.9 kg   SpO2 (!) 84%   BMI 23.42 kg/m  Vital signs in last 24 hours: Temp:  [98.2 F (36.8 C)-100.1 F (37.8 C)] 99.3 F (37.4 C) (06/29 0841) Pulse Rate:  [88-104] 103 (06/29 0841) Resp:  [16-27] 20 (06/29 0841) BP: (134-153)/(58-70) 141/68 (06/29 0841) SpO2:  [84 %-97 %] 84 % (06/29 0841) General: Cachectic appearing sleepy HEENT: Normocephalic atraumatic Lungs: Wheezing and rales all over Abdomen nondistended nontender Neurological exam Sleeping in bed Opens eyes to voice Able to follow simple commands inconsistently. Very hoarse and dysarthric when he attempts to say his name-only thing he could tell me again today was his name.  Unable to tell me month or age. Unable to repeat-poor attention concentration likely contributing as well. Cranial nerves II to XII: Pupils equal round reactive to light, again resists eye opening today like yesterday and I question rightward gaze preference like yesterday, does not blink to threat consistently from either side, difficult to ascertain facial symmetry but when he is observed laying in bed, there is some left-sided facial weakness. Motor examination with flaccid left upper extremity.  Left lower extremity again 1-2/5 in strength.  Right upper extremity full strength.  Right lower extremity with mild weakness versus full strength - difficult to assess given his poor attention concentration Sensation: Grimaces and withdraws to noxious stimulation Coordination-cannot assess given his mentation   Medications  Current Facility-Administered Medications:    acetaminophen (TYLENOL) tablet 650 mg, 650 mg, Oral, Q4H PRN **OR**  acetaminophen (TYLENOL) 160 MG/5ML solution 650 mg, 650 mg, Per Tube, Q4H PRN **OR** acetaminophen (TYLENOL) suppository 650 mg, 650 mg, Rectal, Q4H PRN, Mansy, Jan A, MD, 650 mg at 02/23/2023 2309   albuterol (PROVENTIL) (2.5 MG/3ML) 0.083% nebulizer solution 3 mL, 3 mL, Inhalation, Q6H PRN, Mansy, Jan A, MD, 3 mL at 02/09/2023 2218   azithromycin (ZITHROMAX) 500 mg in sodium chloride 0.9 % 250 mL IVPB, 500 mg, Intravenous, Q24H, Mansy, Jan A, MD, Last Rate: 250 mL/hr at 02/25/23 2355, 500 mg at 02/25/23 2355   cefTRIAXone (ROCEPHIN) 2 g in sodium chloride 0.9 % 100 mL IVPB, 2 g, Intravenous, Q24H, Mansy, Jan A, MD, Last Rate: 200 mL/hr at 02/26/23 0606, 2 g at 02/26/23 0606   dextrose 5 % in lactated ringers infusion, , Intravenous, Continuous, Manuela Schwartz, NP, Last Rate: 100 mL/hr at 02/25/23 2355, New Bag at 02/25/23 2355   fluticasone (FLONASE) 50 MCG/ACT nasal spray 2 spray, 2 spray, Each Nare, Daily PRN, Mansy, Jan A, MD   heparin ADULT infusion 100 units/mL (25000 units/254mL), 1,350 Units/hr, Intravenous, Continuous, Coulter, Carolyn, [facility], Last Rate: 13.5 mL/hr at 02/25/23 2103, 1,350 Units/hr at 02/25/23 2103   losartan (COZAAR) tablet 50 mg, 50 mg, Oral, Daily, Mansy, Jan A, MD   magnesium hydroxide (MILK OF MAGNESIA) suspension 30 mL, 30 mL, Oral, Daily PRN, Mansy, Jan A, MD   mirtazapine (REMERON) tablet 7.5 mg, 7.5 mg, Oral, QHS, Mansy, Jan A, MD   montelukast (SINGULAIR) tablet 10 mg, 10 mg, Oral, Daily, Mansy, Jan A, MD   ondansetron (ZOFRAN) injection 4 mg, 4 mg, Intravenous, Q4H  PRN, Mansy, Jan A, MD   pantoprazole (PROTONIX) injection 40 mg, 40 mg, Intravenous, BID WC, Nazari, Walid A, RPH, 40 mg at 02/25/23 1849   senna-docusate (Senokot-S) tablet 1 tablet, 1 tablet, Oral, QHS PRN, Mansy, Jan A, MD   theophylline (UNIPHYL) 400 MG 24 hr tablet 400 mg, 400 mg, Oral, Daily, Mansy, Jan A, MD   traZODone (DESYREL) tablet 25 mg, 25 mg, Oral, QHS PRN, Mansy, Jan A, MD  Labs CBC     Component Value Date/Time   WBC 14.9 (H) 02/26/2023 0547   RBC 3.66 (L) 02/26/2023 0547   HGB 10.7 (L) 02/26/2023 0547   HGB 12.5 (L) 02/10/2014 0507   HCT 33.1 (L) 02/26/2023 0547   HCT 39.1 (L) 02/10/2014 0507   PLT 120 (L) 02/26/2023 0547   PLT 151 02/10/2014 0507   MCV 90.4 02/26/2023 0547   MCV 92 02/10/2014 0507   MCH 29.2 02/26/2023 0547   MCHC 32.3 02/26/2023 0547   RDW 13.7 02/26/2023 0547   RDW 14.6 (H) 02/10/2014 0507   LYMPHSABS 1.4 02/02/2023 2343   LYMPHSABS 0.7 (L) 02/10/2014 0507   MONOABS 1.1 (H) 02/23/2023 2343   MONOABS 0.7 02/10/2014 0507   EOSABS 0.1 02/07/2023 2343   EOSABS 0.0 02/10/2014 0507   BASOSABS 0.1 02/16/2023 2343   BASOSABS 0.0 02/10/2014 0507    CMP     Component Value Date/Time   NA 139 02/26/2023 0547   NA 136 02/10/2014 0507   K 3.8 02/26/2023 0547   K 4.6 02/10/2014 0507   CL 109 02/26/2023 0547   CL 98 02/10/2014 0507   CO2 20 (L) 02/26/2023 0547   CO2 31 02/10/2014 0507   GLUCOSE 107 (H) 02/26/2023 0547   GLUCOSE 181 (H) 02/10/2014 0507   BUN 17 02/26/2023 0547   BUN 21 (H) 02/10/2014 0507   CREATININE 0.93 02/26/2023 0547   CREATININE 0.61 02/10/2014 0507   CALCIUM 8.5 (L) 02/26/2023 0547   CALCIUM 8.2 (L) 02/10/2014 0507   PROT 5.2 (L) 02/07/2023 2343   PROT 5.5 (L) 02/03/2014 0614   ALBUMIN 2.6 (L) 01/31/2023 2343   ALBUMIN 1.9 (L) 02/03/2014 0614   AST 26 02/04/2023 2343   AST 15 02/03/2014 0614   ALT 14 02/11/2023 2343   ALT 16 02/03/2014 0614   ALKPHOS 62 02/12/2023 2343   ALKPHOS 49 02/03/2014 0614   BILITOT 1.0 02/11/2023 2343   BILITOT 0.3 02/03/2014 0614   GFRNONAA >60 02/26/2023 0547   GFRNONAA >60 02/10/2014 0507   GFRAA >60 11/13/2019 0520   GFRAA >60 02/10/2014 0507    Lipid Panel     Component Value Date/Time   CHOL 204 (H) 02/25/2023 0156   TRIG 128 02/25/2023 0156   HDL 33 (L) 02/25/2023 0156   CHOLHDL 6.2 02/25/2023 0156   VLDL 26 02/25/2023 0156   LDLCALC 145 (H) 02/25/2023 0156     Lab Results  Component Value Date   HGBA1C 6.0 (H) 02/15/2023    2D echocardiogram shows EF 40 to 45% with mildly decreased function of the left ventricle.  Left ventricle grade 1 diastolic dysfunction-impaired relaxation.  RV systolic function normal.  Mitral valve grossly normal.  Aortic valve not well-visualized.  Bubble study of limited quality but does not show a large intracardiac shunt.  Left atrium size normal  Imaging I have reviewed images in epic and the results pertinent to this consultation are: CT-head large right hemispheric acute infarct CT angiography head and neck: Acute occlusion of the  cervical right ICA just distal to the bifurcation with distal reconstitution via the ophthalmic artery.  Right MCA and its distal branches are attenuated but remain patent.  Focal filling defect involving the anterior communicating artery complex and proximal right A2 segment likely subocclusive thrombus.  Right ACA is attenuated but patent distally.  Large evolving right MCA and ACA distribution infarct.  6 mm left MCA bifurcation aneurysm.  Severe emphysema with irregular posttreatment changes within the left upper lobe similar to prior CT.  Superimposed hazy and patchy opacities favored to reflect edema or atelectasis.  Enlarged mediastinal and hilar lymphadenopathy on the right.   MRI examination of the brain-large evolving right ACA and MCA distribution infarcts.  Additional patchy small volume infarcts in the contralateral left cerebral hemisphere, left thalamus and bilateral cerebellar hemispheres.  No associated hemorrhage or mass effect.  Loss of normal flow void within the right ICA to the siphon consistent with previously identified occlusion of the right ICA on the CT angiography head and neck.  Underlying age-related atrophy and small vessel disease.   CT angio chest PE protocol: Nonocclusive segmental and subsegmental pulmonary emboli to the right lower lobe.  No findings of right  heart strain at this time.  Increased lymphadenopathy in the mediastinum and right hilar nodal stations concerning for potential metastatic disease in this patient with a history of lung cancer.  Repeat PET/CT should be considered if appropriate.  Increasing areas of airspace consolidation in the right lung most severe in the right middle and lower lobes concerning for probable pneumonia.  Diffuse bronchial wall thickening and moderate centrilobular and paraseptal emphysema-suggestive of underlying COPD.  Aortic atherosclerosis in addition to left main and left anterior descending coronary artery disease.  Thickening and calcification of the aortic valve.  Assessment:  67 year old man history of lung cancer status post SBRT, COPD, DVT on anticoagulation at home, dysphagia, headaches, peripheral vascular disease presented to the ER after being found down and not being heard from the family with last known well 2 days prior to presentation.  CT head with large right ACA/MCA territory infarcts, MRI confirms infarct with CT angiography of the head and neck consistent with occlusion of the right internal carotid artery cervical portion just distal to the bifurcation along with the continuation of right MCA branches.  Also right A2 segment with subocclusive thrombus. Etiology likely hypercoagulability versus a cardioembolic source.  Impression: Acute ischemic stroke involving the right ACA and right MCA territory-likely emboli from hypercoagulability versus cardioembolic source. History of lung cancer COPD  Recommendations: Continue close neuromonitoring Telemetry Frequent neurochecks Given somewhat of a decline in his mental status, I would recommend a head CT repeat to rule out hemorrhagic transformation given that he is on anticoagulation. A1c at goal less than 7 LDL at goal.  High intensity statin  On heparin stroke protocol-continue for now.  Change to DOAC after about a week since last known well  which will be around March 01, 2023. No clear intracardiac source, no shunt on bubble study on transthoracic echo.  Can consider TEE but that will not change the management, which is anticoagulation. Avoid hypotension.  I am okay with the blood pressure systolic range of 120-160 for another day or so before normalizing blood pressures completely. Management of PE and lung cancer per oncology.  Appreciated palliative consultations which I would recommend continuing given the large size of his stroke and other comorbidities which are likely to give him a very poor quality of life going forward.  Plan relayed to Dr. Clide Dales  -- Milon Dikes, MD Neurologist [facility] Pager: [phone]

## 2023-02-26 NOTE — Evaluation (Signed)
Speech Language Pathology Evaluation Patient Details Name: Steve Gregory MRN: [id number] DOB: 08-23-1956 Today's Date: 02/26/2023 Time: 4098-1191 SLP Time Calculation (min) (ACUTE ONLY): 8 min  Problem List:  Patient Active Problem List   Diagnosis Date Noted   Acute pulmonary embolism (HCC) 02/25/2023   Right middle lobe pneumonia 02/25/2023   Acute cerebral infarction (HCC) 02/02/2023   Chronic obstructive pulmonary disease (COPD) (HCC) 02/02/2023   GERD without esophagitis 02/04/2023   History of deep venous thrombosis (DVT) of distal vein of left lower extremity 02/06/2023   Peripheral neuropathy 02/04/2023   Sepsis due to undetermined organism (HCC) 02/14/2023   COPD exacerbation (HCC) 11/12/2019   Myalgia due to statin 07/12/2019   Goals of care, counseling/discussion 08/21/2018   Neurogenic pain 07/10/2018   Chronic pain syndrome 07/10/2018   Vitamin D insufficiency 04/18/2018   Pharmacologic therapy 04/11/2018   Disorder of skeletal system 04/11/2018   Problems influencing health status 04/11/2018   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   Lumbosacral radiculopathy at S1 01/03/2018   Chronic bilateral low back pain with bilateral sciatica 01/03/2018   Lumbar degenerative disc disease 01/03/2018   Cervicalgia 01/03/2018   Lung mass 07/11/2017   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) 07/11/2017   Lower extremity pain, bilateral 06/28/2017   Varicose veins of both lower extremities with pain 06/28/2017   Tobacco dependence 06/28/2017   Hyperglycemia 03/07/2017   Prediabetes 03/07/2017   Healthcare maintenance 11/04/2016   History of depression 06/05/2014   PVD (peripheral vascular disease) (HCC) 06/05/2014   HTN (hypertension), benign 02/22/2014   GERD (gastroesophageal reflux disease) 02/22/2014   COPD (chronic obstructive pulmonary disease) with emphysema (HCC) 02/21/2014   Past Medical History:  Past Medical History:   Diagnosis Date   Allergy    Anxiety    Arthritis    Barrett's esophagus    Bronchitis, chronic (HCC)    Cancer (HCC)    LUNG   COPD (chronic obstructive pulmonary disease) (HCC)    Cough    CHRONIC   Depression    Dizziness    DVT (deep venous thrombosis) (HCC)    Dysphagia    Essential hypertension 06/28/2017   GERD (gastroesophageal reflux disease)    H/O emphysema (HCC)    Headache    History of depression 06/05/2014   Last Assessment & Plan:  Mood is doing well on medications.    History of hiatal hernia    Hoarseness of voice    partial paralyzed vocal cord   HOH (hard of hearing)    Hypertension    Lung mass    Maxillary fracture (HCC)    LEFT SIDE   Orthopnea    Oxygen decrease    H/O HOME USE, NOT NOW   Personal history of colonic polyps    Pneumonia    PSEUDOMONAL PNEUMONIA IN THE PAST   PVD (peripheral vascular disease) (HCC) 06/05/2014   Overview:  Smoker with poor pulses  Last Assessment & Plan:  No leg pain is noted of late.    Renal failure    PRERENAL   Seizures (HCC)    sounds like it could be vagal at times, coughing can cause him to fade out and see white lights. had last sz 3 months ago when not taking meds   Shortness of breath dyspnea    uses many different inhalers   Squamous cell carcinoma of skin 01/07/2020   right mid volar forearm, mid back spinal  Varicose veins of both lower extremities with pain 06/28/2017   Wheezing    Past Surgical History:  Past Surgical History:  Procedure Laterality Date   CATARACT EXTRACTION W/PHACO Right 10/27/2015   Procedure: CATARACT EXTRACTION PHACO AND INTRAOCULAR LENS PLACEMENT (IOC) suture placed in right eye at end of procedure;  Surgeon: Sallee Lange, MD;  Location: [facility] ORS;  Service: Ophthalmology;  Laterality: Right;  [place] 01:02 AP% 24.4 CDE 28.12 fluid pack lot # [id number]   CATARACT EXTRACTION W/PHACO Left 11/17/2015   Procedure: CATARACT EXTRACTION PHACO AND INTRAOCULAR LENS PLACEMENT  (IOC);  Surgeon: Sallee Lange, MD;  Location: [facility] ORS;  Service: Ophthalmology;  Laterality: Left;  [place] 01:29 AP% 24.3 CDE 40.05 fluid pack lot # [id number]   COLONOSCOPY WITH PROPOFOL N/A 07/28/2017   Procedure: COLONOSCOPY WITH PROPOFOL;  Surgeon: Christena Deem, MD;  Location: [facility] ENDOSCOPY;  Service: Endoscopy;  Laterality: N/A;   COLONOSCOPY WITH PROPOFOL N/A 06/09/2020   Procedure: COLONOSCOPY WITH PROPOFOL;  Surgeon: Regis Bill, MD;  Location: [facility] ENDOSCOPY;  Service: Endoscopy;  Laterality: N/A;   ESOPHAGOGASTRODUODENOSCOPY (EGD) WITH PROPOFOL N/A 06/06/2017   Procedure: ESOPHAGOGASTRODUODENOSCOPY (EGD) WITH PROPOFOL;  Surgeon: Christena Deem, MD;  Location: [facility] ENDOSCOPY;  Service: Endoscopy;  Laterality: N/A;   ESOPHAGOGASTRODUODENOSCOPY (EGD) WITH PROPOFOL N/A 06/09/2020   Procedure: ESOPHAGOGASTRODUODENOSCOPY (EGD) WITH PROPOFOL;  Surgeon: Regis Bill, MD;  Location: [facility] ENDOSCOPY;  Service: Endoscopy;  Laterality: N/A;   ESOPHAGOGASTRODUODENOSCOPY (EGD) WITH PROPOFOL N/A 04/06/2022   Procedure: ESOPHAGOGASTRODUODENOSCOPY (EGD) WITH PROPOFOL;  Surgeon: Regis Bill, MD;  Location: [facility] ENDOSCOPY;  Service: Endoscopy;  Laterality: N/A;   EYE SURGERY     FACIAL RECONSTRUCTION SURGERY  1980   LEFT CHEEK BONE INJURY 1980   HPI:  Pt is a 67 y.o. Caucasian male with medical history significant for lung cancer status post SBRT, COPD, depression, DVT, dysphagia w/ Barrett's Esophagus, anxiety and osteoarthritis, headaches, peripheral vascular disease, documented history of seizures, chronic dyspnea brought in after being found down by family with a last known well sometime on Tuesday, 02/22/2023.  Patient unable to provide history.  Pt presented w/ Left sided weakness and expressive aphasia at admit.  The patient's brother stated that on Tuesday he told him he is eyes were crossed and he had double vision then they did not hear from him for 2 days.  He was  recently diagnosed with left lower extremity DVT and placed on Eliquis.     Noncontrasted Head CT scan showed large right MCA territory acute infarction with no hemorrhage or mass effect.  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 Distal reconstitution via the ophthalmic artery. Right MCA and its distal branches are attenuated but remain patent. Focal filling defects involving the anterior communicating artery complex and proximal right A2 segment, likely subocclusive thrombus. Right ACA is attenuated but patent distally.  Large evolving right MCA/ACA distribution infarct.  MRI brain confirmed the findings.   CXR at admit: Stable radiation changes involving both upper lobes. No definite  acute abnormality is noted. Emphysema.  CTA of Chest following: segmental and subsegmental PE to the right lower lobe, increasing mediastinal lymphadenopathy and right hilar nodal stations concerning for potential metastatic disease, increasing areas of airspace consolidation right lung most severe in right middle and lower lobes concerning for pneumonia, diffuse bronchial wall thickening.  MD spoke to family about the potential metastatic lung disease on 02/25/2023. Marland Kitchen   Assessment / Plan /  Recommendation Clinical Impression  Pt presents with profound cognitive impairment impacting his alertness and ability to respond to his environment. Pt with Head CT today which revealed worsening mass effect - "Confluent cytotoxic edema in both the right ACA and majority of the right MCA territories. Intracranial mass effect, mostly at  the level of the frontal horns with mild 5 mm midline shift of the  anterior septum pellucidum there. This mass effect has progressed  from the MRI 2 days ago."   Pt requires total multimodal assistance for neutral positioning in bed but is unable to maintain position even with the support towel rolls and pillows. While he kept his eyes closed, he did groan  and grimace as I attempted to reposition his head and torse. He didn't respond to his name or withdraw from noxious stimulation. But did state "this is bad" x 1. Given pt's profound cognitive deficits suspect that if family chooses aggressive medical treatment pt will likely need a long-term source of nutrition to sustain hydration and nutrition. At this time, prognosis for functional meaningful recovery is very guarded.  Recommend acute ST services to provide stimulation, promote increased arousal and response to environment.     SLP Assessment  SLP Recommendation/Assessment: Patient needs continued Speech Lanaguage Pathology Services SLP Visit Diagnosis: Cognitive communication deficit (R41.841)    Recommendations for follow up therapy are one component of a multi-disciplinary discharge planning process, led by the attending physician.  Recommendations may be updated based on patient status, additional functional criteria and insurance authorization.    Follow Up Recommendations  Follow physician's recommendations for discharge plan and follow up therapies (Palliative Care)    Assistance Recommended at Discharge   (TBD)  Functional Status Assessment Patient has had a recent decline in their functional status and/or demonstrates limited ability to make significant improvements in function in a reasonable and predictable amount of time  Frequency and Duration min 4x/week  2 weeks      SLP Evaluation Cognition  Overall Cognitive Status: Impaired/Different from baseline Arousal/Alertness:  (severely decreased)             Oral / Motor  Oral Motor/Sensory Function Overall Oral Motor/Sensory Function: Other (comment) (suspect severe left sided impairment d/t left labial escape of salvia)            Malika Demario B. Dreama Saa, M.S., CCC-SLP, Tree surgeon Certified Brain Injury Specialist [facility]  [facility] Services Office  [phone] Ascom [phone] Fax [phone]

## 2023-02-26 NOTE — Progress Notes (Signed)
       CROSS COVER NOTE  NAME: Steve Gregory MRN: [id number] DOB : 29-Mar-1956    Concern as stated by nurse / staff    Patient is NPO and FSBS of 62. Can I change his fluids from NS@100ml /hr to D5?     Pertinent findings on chart review: Patient admitted with CVA, failed swallow screen and remains npo  Assessment and  Interventions   Assessment:  Plan: Change IV fluids to D5LR 100 Cbg monitoring every 4h       [name] NP [facility] Cross Cover 7pm-7am - check amion for availability Pager [phone]

## 2023-02-26 NOTE — Consult Note (Signed)
ANTICOAGULATION CONSULT NOTE  Pharmacy Consult for Heparin Indication: pulmonary embolus and stroke  Allergies  Allergen Reactions   Chantix [Varenicline] Swelling        Lipitor [Atorvastatin] Swelling   Mobic [Meloxicam] Other (See Comments)    Myalgia   Statins Other (See Comments)    Myalgia    Patient Measurements: Height: 5\' 9"  (175.3 cm) Weight: 71.9 kg (158 lb 9.6 oz) IBW/kg (Calculated) : 70.7 Heparin Dosing Weight: 71.9 kg  Vital Signs: Temp: 100.1 F (37.8 C) (06/29 0419) Temp Source: Oral (06/28 2027) BP: 144/70 (06/29 0419) Pulse Rate: 104 (06/29 0419)  Labs: Recent Labs    02/07/2023 1814 02/23/2023 2343 02/25/23 2001 02/26/23 0104 02/26/23 0547  HGB 12.2* 9.9*  --   --  10.7*  HCT 37.8* 31.6*  --   --  33.1*  PLT 140* 122*  --   --  120*  APTT 33 41* 48* 71* 75*  LABPROT 17.4* 18.4*  --   --   --   INR 1.4* 1.5*  --   --   --   HEPARINUNFRC  --   --  0.64 0.67  --   CREATININE 1.13 0.98  --   --  0.93  CKTOTAL 370  --   --   --   --      Estimated Creatinine Clearance: 78.1 mL/min (by C-G formula based on SCr of 0.93 mg/dL).   Medical History: Past Medical History:  Diagnosis Date   Allergy    Anxiety    Arthritis    Barrett's esophagus    Bronchitis, chronic (HCC)    Cancer (HCC)    LUNG   COPD (chronic obstructive pulmonary disease) (HCC)    Cough    CHRONIC   Depression    Dizziness    DVT (deep venous thrombosis) (HCC)    Dysphagia    Essential hypertension 06/28/2017   GERD (gastroesophageal reflux disease)    H/O emphysema (HCC)    Headache    History of depression 06/05/2014   Last Assessment & Plan:  Mood is doing well on medications.    History of hiatal hernia    Hoarseness of voice    partial paralyzed vocal cord   HOH (hard of hearing)    Hypertension    Lung mass    Maxillary fracture (HCC)    LEFT SIDE   Orthopnea    Oxygen decrease    H/O HOME USE, NOT NOW   Personal history of colonic polyps    Pneumonia     PSEUDOMONAL PNEUMONIA IN THE PAST   PVD (peripheral vascular disease) (HCC) 06/05/2014   Overview:  Smoker with poor pulses  Last Assessment & Plan:  No leg pain is noted of late.    Renal failure    PRERENAL   Seizures (HCC)    sounds like it could be vagal at times, coughing can cause him to fade out and see white lights. had last sz 3 months ago when not taking meds   Shortness of breath dyspnea    uses many different inhalers   Squamous cell carcinoma of skin 01/07/2020   right mid volar forearm, mid back spinal   Varicose veins of both lower extremities with pain 06/28/2017   Wheezing    Assessment: Steve Gregory is a 67 y.o. male presenting with acute onset of left-sided weakness with expressive dysphasia of questionable onset. PMH significant for anxiety, depression, GERD, HTN, emphysema, PVD, Barrett's esophagus, anxiety, OA.  Patient was on apixaban 5 mg BID PTA with last dose thought to be earlier this week. He was recently diagnosed with left lower extremity DVT and placed on Eliquis. 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 He was on anticoagulation at home. Per neurology, given the large size of the stroke, anticoagulation puts him at risk of hemorrhagic conversion but if that has to be used for PE, and recommended using heparin drip stroke protocol with lower HL/aPTT goals. Pharmacy has been consulted to initiate and manage heparin infusion.   Baseline Labs: aPTT 41, PT 18.4, INR 1.5, Hgb 9.9, Hct 31.6, Plt 122   Goal of Therapy:  Heparin level 03.-0.5 units/ml aPTT 66-85 seconds Monitor platelets by anticoagulation protocol: Yes   Date Time aPTT/HL Rate/Comment  6/28 2001 48/0.64 1200/aPTT subtherapeutic 6/29 0104 71/0.67 1350/aPTT therapeutic x 1 6/29 0547 75/--  1350/aPTT therapeutic x 2  Plan:  No boluses per MD Continue heparin infusion at 1350  units/hr Recheck aPTT and HL with AM labs Check HL daily for correlation until HL and aPTT correlate. Switch to HL monitoring once HL and aPTT correlate.  Continue to monitor H&H and platelets daily while on heparin infusion    Bettey Costa, PharmD Clinical Pharmacist 02/26/2023 7:14 AM

## 2023-02-27 ENCOUNTER — Inpatient Hospital Stay: Payer: Medicare Other

## 2023-02-27 DIAGNOSIS — K219 Gastro-esophageal reflux disease without esophagitis: Secondary | ICD-10-CM | POA: Diagnosis not present

## 2023-02-27 DIAGNOSIS — A419 Sepsis, unspecified organism: Secondary | ICD-10-CM | POA: Diagnosis not present

## 2023-02-27 DIAGNOSIS — I639 Cerebral infarction, unspecified: Secondary | ICD-10-CM | POA: Diagnosis not present

## 2023-02-27 DIAGNOSIS — J432 Centrilobular emphysema: Secondary | ICD-10-CM | POA: Diagnosis not present

## 2023-02-27 DIAGNOSIS — Z7189 Other specified counseling: Secondary | ICD-10-CM | POA: Diagnosis not present

## 2023-02-27 DIAGNOSIS — I2699 Other pulmonary embolism without acute cor pulmonale: Secondary | ICD-10-CM | POA: Diagnosis not present

## 2023-02-27 LAB — COMPREHENSIVE METABOLIC PANEL
ALT: 18 U/L (ref 0–44)
AST: 50 U/L — ABNORMAL HIGH (ref 15–41)
Albumin: 2.5 g/dL — ABNORMAL LOW (ref 3.5–5.0)
Alkaline Phosphatase: 58 U/L (ref 38–126)
Anion gap: 8 (ref 5–15)
BUN: 16 mg/dL (ref 8–23)
CO2: 22 mmol/L (ref 22–32)
Calcium: 8.3 mg/dL — ABNORMAL LOW (ref 8.9–10.3)
Chloride: 110 mmol/L (ref 98–111)
Creatinine, Ser: 0.81 mg/dL (ref 0.61–1.24)
GFR, Estimated: >60 mL/min (ref >60)
Glucose, Bld: 127 mg/dL — ABNORMAL HIGH (ref 70–99)
Potassium: 3.7 mmol/L (ref 3.5–5.1)
Sodium: 140 mmol/L (ref 135–145)
Total Bilirubin: 0.8 mg/dL (ref 0.3–1.2)
Total Protein: 5.6 g/dL — ABNORMAL LOW (ref 6.5–8.1)

## 2023-02-27 LAB — CBC WITH DIFFERENTIAL/PLATELET
Abs Immature Granulocytes: 0.06 10*3/uL (ref 0.00–0.07)
Basophils Absolute: 0.1 10*3/uL (ref 0.0–0.1)
Basophils Relative: 1 %
Eosinophils Absolute: 0.2 10*3/uL (ref 0.0–0.5)
Eosinophils Relative: 2 %
HCT: 34.6 % — ABNORMAL LOW (ref 39.0–52.0)
Hemoglobin: 10.9 g/dL — ABNORMAL LOW (ref 13.0–17.0)
Immature Granulocytes: 0 %
Lymphocytes Relative: 9 %
Lymphs Abs: 1.3 10*3/uL (ref 0.7–4.0)
MCH: 29 pg (ref 26.0–34.0)
MCHC: 31.5 g/dL (ref 30.0–36.0)
MCV: 92 fL (ref 80.0–100.0)
Monocytes Absolute: 1.2 10*3/uL — ABNORMAL HIGH (ref 0.1–1.0)
Monocytes Relative: 8 %
Neutro Abs: 11.5 10*3/uL — ABNORMAL HIGH (ref 1.7–7.7)
Neutrophils Relative %: 80 %
Platelets: 145 10*3/uL — ABNORMAL LOW (ref 150–400)
RBC: 3.76 MIL/uL — ABNORMAL LOW (ref 4.22–5.81)
RDW: 13.6 % (ref 11.5–15.5)
WBC: 14.4 10*3/uL — ABNORMAL HIGH (ref 4.0–10.5)
nRBC: 0 % (ref 0.0–0.2)

## 2023-02-27 LAB — GLUCOSE, CAPILLARY
Glucose-Capillary: 107 mg/dL — ABNORMAL HIGH (ref 70–99)
Glucose-Capillary: 143 mg/dL — ABNORMAL HIGH (ref 70–99)
Glucose-Capillary: 172 mg/dL — ABNORMAL HIGH (ref 70–99)
Glucose-Capillary: 172 mg/dL — ABNORMAL HIGH (ref 70–99)
Glucose-Capillary: 181 mg/dL — ABNORMAL HIGH (ref 70–99)
Glucose-Capillary: 182 mg/dL — ABNORMAL HIGH (ref 70–99)

## 2023-02-27 LAB — CULTURE, BLOOD (ROUTINE X 2): Culture: NO GROWTH

## 2023-02-27 LAB — HEPARIN LEVEL (UNFRACTIONATED): Heparin Unfractionated: 0.42 IU/mL (ref 0.30–0.70)

## 2023-02-27 LAB — BLOOD GAS, ARTERIAL
Acid-Base Excess: 1.5 mmol/L (ref 0.0–2.0)
Bicarbonate: 25.9 mmol/L (ref 20.0–28.0)
FIO2: 100 %
O2 Saturation: 100 %
Patient temperature: 37
pCO2 arterial: 39 mmHg (ref 32–48)
pH, Arterial: 7.43 (ref 7.35–7.45)
pO2, Arterial: 236 mmHg — ABNORMAL HIGH (ref 83–108)

## 2023-02-27 LAB — MAGNESIUM: Magnesium: 1.8 mg/dL (ref 1.7–2.4)

## 2023-02-27 LAB — APTT: aPTT: 75 seconds — ABNORMAL HIGH (ref 24–36)

## 2023-02-27 LAB — HIV ANTIBODY (ROUTINE TESTING W REFLEX): HIV Screen 4th Generation wRfx: NONREACTIVE

## 2023-02-27 LAB — PROCALCITONIN: Procalcitonin: 0.2 ng/mL

## 2023-02-27 MED ORDER — METHYLPREDNISOLONE SODIUM SUCC 40 MG IJ SOLR
40.0000 mg | Freq: Two times a day (BID) | INTRAMUSCULAR | Status: DC
  Administered 2023-02-27 – 2023-03-03 (×9): 40 mg via INTRAVENOUS
  Filled 2023-02-27 (×9): qty 1

## 2023-02-27 MED ORDER — GLYCOPYRROLATE 0.2 MG/ML IJ SOLN
0.2000 mg | Freq: Once | INTRAMUSCULAR | Status: AC
  Administered 2023-02-27: 0.2 mg via INTRAVENOUS
  Filled 2023-02-27: qty 1

## 2023-02-27 MED ORDER — SODIUM CHLORIDE 0.9 % IV SOLN
3.0000 g | Freq: Four times a day (QID) | INTRAVENOUS | Status: DC
  Administered 2023-02-27 – 2023-03-03 (×17): 3 g via INTRAVENOUS
  Filled 2023-02-27 (×18): qty 8

## 2023-02-27 MED ORDER — GLYCOPYRROLATE 0.2 MG/ML IJ SOLN
0.2000 mg | INTRAMUSCULAR | Status: DC | PRN
  Administered 2023-02-27 – 2023-03-03 (×11): 0.2 mg via INTRAVENOUS
  Filled 2023-02-27 (×15): qty 1

## 2023-02-27 NOTE — Plan of Care (Signed)
  Problem: Education: Goal: Knowledge of disease or condition will improve Outcome: Progressing Goal: Knowledge of secondary prevention will improve (MUST DOCUMENT ALL) Outcome: Progressing Goal: Knowledge of patient specific risk factors will improve Loraine Leriche N/A or DELETE if not current risk factor) Outcome: Progressing   Problem: Ischemic Stroke/TIA Tissue Perfusion: Goal: Complications of ischemic stroke/TIA will be minimized Outcome: Progressing   Problem: Coping: Goal: Will verbalize positive feelings about self Outcome: Progressing Goal: Will identify appropriate support needs Outcome: Progressing   Problem: Health Behavior/Discharge Planning: Goal: Ability to manage health-related needs will improve Outcome: Progressing Goal: Goals will be collaboratively established with patient/family Outcome: Progressing   Problem: Self-Care: Goal: Ability to participate in self-care as condition permits will improve Outcome: Progressing Goal: Verbalization of feelings and concerns over difficulty with self-care will improve Outcome: Progressing Goal: Ability to communicate needs accurately will improve Outcome: Progressing   Problem: Nutrition: Goal: Risk of aspiration will decrease Outcome: Progressing Goal: Dietary intake will improve Outcome: Progressing   Problem: Fluid Volume: Goal: Hemodynamic stability will improve Outcome: Progressing   Problem: Clinical Measurements: Goal: Diagnostic test results will improve Outcome: Progressing Goal: Signs and symptoms of infection will decrease Outcome: Progressing   Problem: Respiratory: Goal: Ability to maintain adequate ventilation will improve Outcome: Progressing   Problem: Education: Goal: Knowledge of General Education information will improve Description: Including pain rating scale, medication(s)/side effects and non-pharmacologic comfort measures Outcome: Progressing   Problem: Health Behavior/Discharge  Planning: Goal: Ability to manage health-related needs will improve Outcome: Progressing   Problem: Clinical Measurements: Goal: Ability to maintain clinical measurements within normal limits will improve Outcome: Progressing Goal: Will remain free from infection Outcome: Progressing Goal: Diagnostic test results will improve Outcome: Progressing Goal: Respiratory complications will improve Outcome: Progressing Goal: Cardiovascular complication will be avoided Outcome: Progressing   Problem: Activity: Goal: Risk for activity intolerance will decrease Outcome: Progressing   Problem: Nutrition: Goal: Adequate nutrition will be maintained Outcome: Progressing   Problem: Coping: Goal: Level of anxiety will decrease Outcome: Progressing   Problem: Elimination: Goal: Will not experience complications related to bowel motility Outcome: Progressing Goal: Will not experience complications related to urinary retention Outcome: Progressing   Problem: Pain Managment: Goal: General experience of comfort will improve Outcome: Progressing   Problem: Safety: Goal: Ability to remain free from injury will improve Outcome: Progressing   Problem: Skin Integrity: Goal: Risk for impaired skin integrity will decrease Outcome: Progressing

## 2023-02-27 NOTE — Progress Notes (Signed)
SLP Cancellation Note  Patient Details Name: Steve Gregory MRN: [id number] DOB: 1956/06/28   Cancelled treatment:       Reason Eval/Treat Not Completed: Medical issues which prohibited therapy. Per chart review, pt with reduced secretion management requiring NT suctioning as well as tachycardia overnight. Pt now DNR/DNI. Will defer SLP efforts at this time given the above. SLP to f/u as appropriate.  [name], M.S., CCC-SLP Speech-Language Pathologist [facility] [phone] [name])  [name] 02/27/2023, 9:39 AM

## 2023-02-27 NOTE — Progress Notes (Signed)
BS profoundly rhonchorous.  Performed NTS for copious amt of thick greenish tan secretions. Bs much improved thereafter

## 2023-02-27 NOTE — Progress Notes (Signed)
Progress Note   Patient: Steve Gregory WUJ:[id number] DOB: [birth year] DOA: 01/29/2023     3 DOS: the patient was seen and examined on 02/27/2023   Brief hospital course: ZYIEN DIEKEN is a 67 y.o. Caucasian male with medical history significant for anxiety, depression, GERD, essential hypertension, emphysema, peripheral vascular disease, Barrett's esophagus, anxiety and osteoarthritis, who presented to the emergency room with acute onset of left-sided weakness with expressive dysphasia of questionable onset.  The patient's brother stated that on Tuesday he told him he is eyes were crossed and he had double vision.  He was recently diagnosed with left lower extremity DVT and placed on Eliquis.    Noncontrasted head CT scan showed large right MCA territory acute infarction with no hemorrhage or mass effect. 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 Focal filling defects involving the anterior communicating artery complex and proximal right A2 segment, likely subocclusive thrombus. Large evolving right MCA/ACA distribution infarct. MRI brain confirmed the findings. CTA chest showed - segmental and subsegmental PE to the right lower lobe, increasing mediastinal lymphadenopathy and right hilar nodal stations concerning for potential metastatic disease, increasing areas of airspace consolidation right lung most severe in right middle and lower lobes concerning for pneumonia, diffuse bronchial wall thickening, aortic atherosclerosis and calcifications of attic valve.  Assessment and Plan: * Acute cerebral infarction [facility]) Acute MCA/ACA territory infarction with subsequent left-sided hemiplegia. Continue neurochecks as per protocol.  His mental status is poor. Continue telemetry. Continue aspirin, statin therapy. Neurology follow up appreciated. Repeat CT increased mass effect 5mm midline shift. Overall prognosis poor. No DOAC at this time. PT OT once able to  engage. Speech therapist advised N.p.o. for now. Hold oral home medications. Brother elected him to be DNR/ DNI. Palliative team discussing further with family who is leaning towards comfort care.  Sepsis due to undetermined organism [facility]) Right middle and lower lobe pneumonia  CTA chest reveals pneumonia.  Possibly due to aspiration. Secretions worsening, tachypnea noted. Rocephin changed to unasyn therapy. He got IV steroid last night. Blood culture likely contamination.  Repeat blood cultures so far negative. Continue with gentle IV fluids as he is NPO. Monitor vitals closely. Supplemental oxygen as need to maintain saturation above 92%.  Right-sided pulmonary embolism History of deep venous thrombosis (DVT) of distal vein of left lower extremity. Continue heparin drip per pharmacy protocol. Continue to monitor APTT. No transition to oral anticoagulation at this time due to large stroke, hemorrhagic conversion.  GERD without esophagitis Continue IV PPI.  History of lung cancer status post SBRT Chronic obstructive pulmonary disease (COPD) (HCC) Continue DuoNebs, home inhalers. CTA chest revealed worsening lymphadenopathy potential metastatic disease.  Oncology recommended PET/CT once he is more stable.  DVT prophylaxis-on heparin drip CODE STATUS DNR.  Palliative team on board for goals of care discussion.      Subjective: Patient is seen and examined today morning. He is sleepy and lethargic. Respiratory secretions, moderate respiratory distress. He got a dose steroid, abx changed to unasyn. Family made him DNR last night.  Physical Exam: Vitals:   02/27/23 0515 02/27/23 0600 02/27/23 0700 02/27/23 0855  BP:  131/71 124/69   Pulse:  (!) 110 100   Resp:  (!) 27 (!) 21   Temp:    99 F (37.2 C)  TempSrc:      SpO2: 99% 91% 94%   Weight:      Height:  General - Elderly Caucasian male, lethargic obtunded. Moderate respiratory distress HEENT - PERRLA, EOMI,  atraumatic head, non tender sinuses. Lung - Clear, diffuse rhonchi, wheezes, tachypnea Heart - S1, S2 heard, no murmurs, rubs, trace pedal edema Neuro -sleepy, lethargic, unable to do full neuro exam. Skin - Warm and dry. Data Reviewed:  CBC, BMP, aptt, blood sugars, blood/urine cultures  Family Communication: patient's brother update at bedside  Disposition: Status is: Inpatient Remains inpatient appropriate because: large stroke with midline shift  Planned Discharge Destination:  unsure at this time , prognosis poor    MDM level 3- patient has h/o lung cancer, DVT with large stroke, multiple PE, respiratory failure. Poor prognosis. He is at high risk for sudden clinical deterioration.   Author: Marcelino Duster, MD 02/27/2023 1:05 PM  For on call review [email or web address].

## 2023-02-27 NOTE — Progress Notes (Signed)
Pt placed on HFNC@ 5 lpm after ABG.  O2 sat 98

## 2023-02-27 NOTE — Progress Notes (Signed)
Daily Progress Note   Patient Name: Steve Gregory       Date: 02/27/2023 DOB: [birth year]  Age: 67 y.o. MRN#: [id number] Attending Physician: Steve Duster, MD Primary Care Physician: Steve Regulus, MD Admit Date: 01/31/2023  Reason for Consultation/Follow-up: Establishing goals of care  HPI/Brief Hospital Review:  67 y.o. male  with past medical history of lung cancer, COPD, DVT, dysphagia, seizures, PVD, and chronic dyspnea admitted on 02/05/2023 with AMS.   Upon arrival to ED, CT of head revealed large right hemispheric infarct.  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  CT of chest revealed PE of right lower lobe.  Patient is being followed by neurology.   PMT was consulted to discuss goals of care in light of patient's recent stroke.   Subjective: Extensive chart review has been completed prior to meeting patient including labs, vital signs, imaging, progress notes, orders, and available advanced directive documents from current and previous encounters.    Noted acute event of respiratory distress overnight, family called to bedside, after discussions decision was made for DNR/DNI. Also noted review of SLP assessment, deemed unsafe for swallowing evaluation at this time, remains NPO.  Visited with Steve Gregory at his bedside. Attempts to open eyes with calling of his name, drifts back off to sleep without redirection. Brother-Steve Gregory and his wife Steve Gregory at bedside during time of visit, request to have conversations outside of room.  During meeting, Steve Gregory joined. Steve Gregory reviewed and shared imaging findings with family. Also discussed possible causes including clotting related to hypercoagulable state possibly related to underlying malignancy.   Overall poor prognosis discussed with poor chance of a meaningful recovery. Risk of aspiration or recurrent events that occurred overnight also discussed possible leading to or worsening pneumonia.Conversations continued after Steve Gregory left conversation related to continuing aggressive medical management versus shifting our focus to comfort/comfort care. Steve Gregory shares he is struggling with bearing the weight of decision maker and is struggling with acceptance of the severity of his brothers condition. He also shares he feel quite overwhelmed after being called to bedside early this AM. He is hopeful to have conversations with their elderly mother allowing her to also be a part of the decision making process. He requests for me to return to bedside later for ongoing discussions.  Later met at bedside again with Steve Gregory and mother-Steve Gregory. Again reviewed overall prognosis and current medical condition. Reviewed events that happened overnight and he remains high risk for aspiration/respiratory distress. Steve Gregory-mother voices her understanding of the severity of Steve Gregory condition. She shares she had a strong faith background and believes in the power of prayer. She is clear is sharing she does not wish to make a rushed decision regarding Steve Gregory care. She continues to wish for him to continue with all current care allowing more time for outcomes. Steve Gregory is also clear that she would not want Steve Gregory to undergo procedures or interventions not necessary or that would not improve his current condition, she wishes to avoid unnecessary pain and suffering.  Answered and addressed all questions and concerns. Emotional support provided. PMT to continue to follow for ongoing needs and support.  Care plan was discussed with primary and neurology team.  Thank you for allowing the Palliative Medicine Team to assist in the care of this patient.  Total time:  65 minutes  Time spent includes: Detailed review of medical records  (labs, imaging, vital signs), medically appropriate exam (mental status, respiratory, cardiac, skin), discussed with treatment team, counseling and educating patient, family and staff, documenting clinical information, medication management and coordination of care.  Steve Deed, DNP, AGNP-C Palliative Medicine   Please contact Palliative Medicine Team phone at [phone] for questions and concerns.

## 2023-02-27 NOTE — Progress Notes (Signed)
Pharmacy Antibiotic Note  Steve Gregory is a 67 y.o. male admitted on 02/08/2023 with possible aspiration pneumonia.  Pharmacy has been consulted for Unaysn dosing.  Plan: Unasyn 3 gm q6hr for 5 days per indication & renal fxn.  Pharmacy will continue to follow and will adjust abx dosing whenever warranted.  Temp (24hrs), Avg:99.5 F (37.5 C), Min:98.4 F (36.9 C), Max:101.8 F (38.8 C)   Recent Labs  Lab 02/09/2023 1814 02/21/2023 2020 02/12/2023 2343 02/25/23 0156 02/26/23 0547  WBC 16.9*  --  15.0*  --  14.9*  CREATININE 1.13  --  0.98  --  0.93  LATICACIDVEN 1.1 2.7* 1.0 0.9  --     Estimated Creatinine Clearance: 78.1 mL/min (by C-G formula based on SCr of 0.93 mg/dL).    Allergies  Allergen Reactions   Chantix [Varenicline] Swelling        Lipitor [Atorvastatin] Swelling   Mobic [Meloxicam] Other (See Comments)    Myalgia   Statins Other (See Comments)    Myalgia    Antimicrobials this admission: 6/28 Azithromycin >> x 5 days 6/30 Unasyn >> x 5 days  Microbiology results: 6/27 BCx: 2 of 2 bottles w/ Bacillus & Staph Species 6/28 BCx: NG x 2 days  Thank you for allowing pharmacy to be a part of this patient's care.  Otelia Sergeant, PharmD, MBA 02/27/2023 5:06 AM

## 2023-02-27 NOTE — Progress Notes (Signed)
       CROSS COVER NOTE  NAME: FRENCH LAMBERT MRN: [id number] DOB : 1955/11/29    Concern as stated by nurse / staff   Increased airway secretions and tachycardia     Pertinent findings on chart review: Patient with large left/cerebellar infarct .  History of lung cancer treated with rtdiation leading to fibrosis with underlying COPD. Prior to presenting to ED with Stroke, patient was recently put on eliquis for DVT. CTA chest also showed non occlusive PE and enlarged lymph nodes concerning for return of malignancy  Assessment and  Interventions   Assessment: Upon initial eval this am patient with great resp distress inability to clear secretions and required NRB, Very weak cough effortThis improved after NT suctioning and admin of robinol. Now without distress on HFNC Patient without further decline in neuro status however obvious that he will never pass swallow test and is chronically aspirating. Non verbal this am. Per nursing staff they have only been able t o get him to say his name . Likely unable to understand/communicate Chest xray IMPRESSION: 1. The appearance of the chest suggests severe bronchitis with progressive multilobar pneumonia, most severe in the right lung. 2. Small left pleural effusion.  WBC not significantly changed but remains elevated Procal 0.20  Plan: Methylprednisolone 40 IV q 12 h Rocephin changed to unasyn Continue as needed robinol Called family in - long discussion with brother Casimiro Needle and his wife regarding prognosis and likely need for intubation at some point - Brother has elected for change in code status to DNR/DNI but continue treatment and therapies up to the point for now       [name] NP [facility] 7pm-7am - Company secretary for availability Pager [phone]

## 2023-02-27 NOTE — Progress Notes (Signed)
Pt NT suctioned for mod amt thick tan secretions.  Tol well

## 2023-02-27 NOTE — Progress Notes (Addendum)
Neurology Progress Note   S:// Overnight required deep suctioning due to increased secretions. More awake appearing this morning.   O:// Current vital signs: BP 124/69   Pulse 100   Temp 99 F (37.2 C)   Resp (!) 21   Ht 5\' 9"  (1.753 m)   Wt 71.9 kg   SpO2 94%   BMI 23.42 kg/m  Vital signs in last 24 hours: Temp:  [98.4 F (36.9 C)-101.8 F (38.8 C)] 99 F (37.2 C) (06/30 0855) Pulse Rate:  [98-116] 100 (06/30 0700) Resp:  [18-27] 21 (06/30 0700) BP: (124-162)/(66-77) 124/69 (06/30 0700) SpO2:  [91 %-99 %] 94 % (06/30 0700) General: Cachectic appearing sleepy HEENT: Normocephalic atraumatic Lungs: Wheezing and rales all over Abdomen nondistended nontender Neurological exam Comfortably sleeping in bed Opens eyes to voice Left-sided gaze preference Does not blink to threat from the left Left facial droop Left hemiplegia Sensory loss on the left. Coordination difficult to assess  Medications  Current Facility-Administered Medications:    acetaminophen (TYLENOL) tablet 650 mg, 650 mg, Oral, Q4H PRN **OR** acetaminophen (TYLENOL) 160 MG/5ML solution 650 mg, 650 mg, Per Tube, Q4H PRN **OR** acetaminophen (TYLENOL) suppository 650 mg, 650 mg, Rectal, Q4H PRN, Mansy, Jan A, MD, 650 mg at 02/26/23 1755   albuterol (PROVENTIL) (2.5 MG/3ML) 0.083% nebulizer solution 3 mL, 3 mL, Inhalation, Q6H PRN, Mansy, Jan A, MD, 3 mL at 02/27/23 0029   Ampicillin-Sulbactam (UNASYN) 3 g in sodium chloride 0.9 % 100 mL IVPB, 3 g, Intravenous, Q6H, Belue, Lendon Collar, RPH, Last Rate: 200 mL/hr at 02/27/23 0612, 3 g at 02/27/23 0612   azithromycin (ZITHROMAX) 500 mg in sodium chloride 0.9 % 250 mL IVPB, 500 mg, Intravenous, Q24H, Mansy, Jan A, MD, Last Rate: 250 mL/hr at 02/27/23 0009, 500 mg at 02/27/23 0009   dextrose 5 % in lactated ringers infusion, , Intravenous, Continuous, Manuela Schwartz, NP, Last Rate: 100 mL/hr at 02/27/23 0609, New Bag at 02/27/23 0609   fluticasone (FLONASE) 50  MCG/ACT nasal spray 2 spray, 2 spray, Each Nare, Daily PRN, Mansy, Jan A, MD   glycopyrrolate (ROBINUL) injection 0.2 mg, 0.2 mg, Intravenous, Q4H PRN, Manuela Schwartz, NP   heparin ADULT infusion 100 units/mL (25000 units/242mL), 1,350 Units/hr, Intravenous, Continuous, Coulter, Carolyn, [facility], Last Rate: 13.5 mL/hr at 02/27/23 0437, 1,350 Units/hr at 02/27/23 0437   losartan (COZAAR) tablet 50 mg, 50 mg, Oral, Daily, Mansy, Jan A, MD   magnesium hydroxide (MILK OF MAGNESIA) suspension 30 mL, 30 mL, Oral, Daily PRN, Mansy, Jan A, MD   methylPREDNISolone sodium succinate (SOLU-MEDROL) 40 mg/mL injection 40 mg, 40 mg, Intravenous, Q12H, Manuela Schwartz, NP, 40 mg at 02/27/23 0435   mirtazapine (REMERON) tablet 7.5 mg, 7.5 mg, Oral, QHS, Mansy, Jan A, MD   montelukast (SINGULAIR) tablet 10 mg, 10 mg, Oral, Daily, Mansy, Jan A, MD   ondansetron [facility]) injection 4 mg, 4 mg, Intravenous, Q4H PRN, Mansy, Jan A, MD   pantoprazole (PROTONIX) injection 40 mg, 40 mg, Intravenous, BID WC, Nazari, Walid A, RPH, 40 mg at 02/25/23 1849   senna-docusate (Senokot-S) tablet 1 tablet, 1 tablet, Oral, QHS PRN, Mansy, Jan A, MD   theophylline (UNIPHYL) 400 MG 24 hr tablet 400 mg, 400 mg, Oral, Daily, Mansy, Jan A, MD   traZODone (DESYREL) tablet 25 mg, 25 mg, Oral, QHS PRN, Mansy, Jan A, MD  Labs CBC    Component Value Date/Time   WBC 14.4 (H) 02/27/2023 0458   RBC 3.76 (L) 02/27/2023 1610  HGB 10.9 (L) 02/27/2023 0458   HGB 12.5 (L) 02/10/2014 0507   HCT 34.6 (L) 02/27/2023 0458   HCT 39.1 (L) 02/10/2014 0507   PLT 145 (L) 02/27/2023 0458   PLT 151 02/10/2014 0507   MCV 92.0 02/27/2023 0458   MCV 92 02/10/2014 0507   MCH 29.0 02/27/2023 0458   MCHC 31.5 02/27/2023 0458   RDW 13.6 02/27/2023 0458   RDW 14.6 (H) 02/10/2014 0507   LYMPHSABS 1.3 02/27/2023 0458   LYMPHSABS 0.7 (L) 02/10/2014 0507   MONOABS 1.2 (H) 02/27/2023 0458   MONOABS 0.7 02/10/2014 0507   EOSABS 0.2 02/27/2023 0458   EOSABS  0.0 02/10/2014 0507   BASOSABS 0.1 02/27/2023 0458   BASOSABS 0.0 02/10/2014 0507    CMP     Component Value Date/Time   NA 140 02/27/2023 0458   NA 136 02/10/2014 0507   K 3.7 02/27/2023 0458   K 4.6 02/10/2014 0507   CL 110 02/27/2023 0458   CL 98 02/10/2014 0507   CO2 22 02/27/2023 0458   CO2 31 02/10/2014 0507   GLUCOSE 127 (H) 02/27/2023 0458   GLUCOSE 181 (H) 02/10/2014 0507   BUN 16 02/27/2023 0458   BUN 21 (H) 02/10/2014 0507   CREATININE 0.81 02/27/2023 0458   CREATININE 0.61 02/10/2014 0507   CALCIUM 8.3 (L) 02/27/2023 0458   CALCIUM 8.2 (L) 02/10/2014 0507   PROT 5.6 (L) 02/27/2023 0458   PROT 5.5 (L) 02/03/2014 0614   ALBUMIN 2.5 (L) 02/27/2023 0458   ALBUMIN 1.9 (L) 02/03/2014 0614   AST 50 (H) 02/27/2023 0458   AST 15 02/03/2014 0614   ALT 18 02/27/2023 0458   ALT 16 02/03/2014 0614   ALKPHOS 58 02/27/2023 0458   ALKPHOS 49 02/03/2014 0614   BILITOT 0.8 02/27/2023 0458   BILITOT 0.3 02/03/2014 0614   GFRNONAA >60 02/27/2023 0458   GFRNONAA >60 02/10/2014 0507   GFRAA >60 11/13/2019 0520   GFRAA >60 02/10/2014 0507    Lipid Panel     Component Value Date/Time   CHOL 204 (H) 02/25/2023 0156   TRIG 128 02/25/2023 0156   HDL 33 (L) 02/25/2023 0156   CHOLHDL 6.2 02/25/2023 0156   VLDL 26 02/25/2023 0156   LDLCALC 145 (H) 02/25/2023 0156    Lab Results  Component Value Date   HGBA1C 6.0 (H) 01/30/2023    2D echocardiogram shows EF 40 to 45% with mildly decreased function of the left ventricle.  Left ventricle grade 1 diastolic dysfunction-impaired relaxation.  RV systolic function normal.  Mitral valve grossly normal.  Aortic valve not well-visualized.  Bubble study of limited quality but does not show a large intracardiac shunt.  Left atrium size normal  Imaging I have reviewed images in epic and the results pertinent to this consultation are: CT-head large right hemispheric acute infarct CT angiography head and neck: Acute occlusion of the  cervical right ICA just distal to the bifurcation with distal reconstitution via the ophthalmic artery.  Right MCA and its distal branches are attenuated but remain patent.  Focal filling defect involving the anterior communicating artery complex and proximal right A2 segment likely subocclusive thrombus.  Right ACA is attenuated but patent distally.  Large evolving right MCA and ACA distribution infarct.  6 mm left MCA bifurcation aneurysm.  Severe emphysema with irregular posttreatment changes within the left upper lobe similar to prior CT.  Superimposed hazy and patchy opacities favored to reflect edema or atelectasis.  Enlarged mediastinal and hilar  lymphadenopathy on the right.   MRI examination of the brain-large evolving right ACA and MCA distribution infarcts.  Additional patchy small volume infarcts in the contralateral left cerebral hemisphere, left thalamus and bilateral cerebellar hemispheres.  No associated hemorrhage or mass effect.  Loss of normal flow void within the right ICA to the siphon consistent with previously identified occlusion of the right ICA on the CT angiography head and neck.  Underlying age-related atrophy and small vessel disease.   CT angio chest PE protocol: Nonocclusive segmental and subsegmental pulmonary emboli to the right lower lobe.  No findings of right heart strain at this time.  Increased lymphadenopathy in the mediastinum and right hilar nodal stations concerning for potential metastatic disease in this patient with a history of lung cancer.  Repeat PET/CT should be considered if appropriate.  Increasing areas of airspace consolidation in the right lung most severe in the right middle and lower lobes concerning for probable pneumonia.  Diffuse bronchial wall thickening and moderate centrilobular and paraseptal emphysema-suggestive of underlying COPD.  Aortic atherosclerosis in addition to left main and left anterior descending coronary artery disease.  Thickening and  calcification of the aortic valve.  CT head done due to increasing somnolence yesterday with no acute findings.  Large right ACA MCA infarct with confluent cytotoxic edema and scattered additional small left cerebellar and bilateral hemisphere infarcts as seen on prior MRI.  No malignant hemorrhagic transformation but increased intracranial mass effect send 02/08/2023 now up to 5 mm of leftward midline shift at the anterior septum pellucidum.  Ventricular mass effect with no ventriculomegaly.  Assessment:  67 year old man history of lung cancer status post SBRT, COPD, DVT on anticoagulation at home, dysphagia, headaches, peripheral vascular disease presented to the ER after being found down and not being heard from the family with last known well 2 days prior to presentation.  CT head with large right ACA/MCA territory infarcts, MRI confirms infarct with CT angiography of the head and neck consistent with occlusion of the right internal carotid artery cervical portion just distal to the bifurcation along with the continuation of right MCA branches.  Also right A2 segment with subocclusive thrombus. Etiology likely hypercoagulability versus a cardioembolic source.  Repeat head CT with some increased intracranial mass effect without herniation or ventriculomegaly.  Impression: Large acute ischemic stroke involving the right ACA and right MCA territory-likely emboli from hypercoagulability versus cardioembolic source. History of lung cancer COPD  Recommendations: Continue close neuromonitoring, Telemetry and frequent neurochecks Palliative care consultation has been obtained.  I spoke with the brother in detail and explained that his outcomes from a neurological meaningful recovery are going to be extremely poor and they should consider comfort measures.  Palliative team has also been consulted.  Patient has a 73 year old mother who would like to come see the patient before any decisions are being made.   They are understanding of the severity but still hoping for miracles. A1c at goal less than 7 LDL at goal.  High intensity statin  On heparin stroke protocol-continue for now.  Changed to DOAC in about a week but given that he still has some mass effect intracranially, I would hold off on DOAC till about 10 days from the last known well which will be around March 04, 2023. No need for TEE as he is already on anticoagulation Avoid hypotension.  Blood pressure goal for another day or so of systolic 120-160 and after that start normalizing blood pressure to a goal of 140/90 on  discharge. Management will PE and lung cancer per oncology--input from oncology may also be of help to direct goals of care conversations Therapy assessments.  I would continue ongoing goals of care conversations given his cancer, COPD and now a large stroke that involves majority of his right hemisphere, a good quality of life would likely not be attainable.   Plan relayed to Dr. Clide Dales and also discussed with Leeanne Deed, NP from the palliative team.  Appreciate PMT assistance  Will follow  Low threshold for repeat head CT if anything changes neurologically  -- Milon Dikes, MD Neurologist [facility] Pager: [phone]

## 2023-02-27 NOTE — Consult Note (Signed)
ANTICOAGULATION CONSULT NOTE  Pharmacy Consult for Heparin Indication: pulmonary embolus and stroke  Allergies  Allergen Reactions   Chantix [Varenicline] Swelling        Lipitor [Atorvastatin] Swelling   Mobic [Meloxicam] Other (See Comments)    Myalgia   Statins Other (See Comments)    Myalgia    Patient Measurements: Height: 5\' 9"  (175.3 cm) Weight: 71.9 kg (158 lb 9.6 oz) IBW/kg (Calculated) : 70.7 Heparin Dosing Weight: 71.9 kg  Vital Signs: Temp: 98.8 F (37.1 C) (06/30 0328) BP: 162/77 (06/30 0328) Pulse Rate: 116 (06/30 0328)  Labs: Recent Labs    02/18/2023 1814 01/31/2023 2343 02/25/23 2001 02/26/23 0104 02/26/23 0547 02/27/23 0458  HGB 12.2* 9.9*  --   --  10.7* 10.9*  HCT 37.8* 31.6*  --   --  33.1* 34.6*  PLT 140* 122*  --   --  120* 145*  APTT 33 41* 48* 71* 75* 75*  LABPROT 17.4* 18.4*  --   --   --   --   INR 1.4* 1.5*  --   --   --   --   HEPARINUNFRC  --   --  0.64 0.67  --  0.42  CREATININE 1.13 0.98  --   --  0.93 0.81  CKTOTAL 370  --   --   --   --   --      Estimated Creatinine Clearance: 89.7 mL/min (by C-G formula based on SCr of 0.81 mg/dL).   Medical History: Past Medical History:  Diagnosis Date   Allergy    Anxiety    Arthritis    Barrett's esophagus    Bronchitis, chronic (HCC)    Cancer (HCC)    LUNG   COPD (chronic obstructive pulmonary disease) (HCC)    Cough    CHRONIC   Depression    Dizziness    DVT (deep venous thrombosis) (HCC)    Dysphagia    Essential hypertension 06/28/2017   GERD (gastroesophageal reflux disease)    H/O emphysema (HCC)    Headache    History of depression 06/05/2014   Last Assessment & Plan:  Mood is doing well on medications.    History of hiatal hernia    Hoarseness of voice    partial paralyzed vocal cord   HOH (hard of hearing)    Hypertension    Lung mass    Maxillary fracture (HCC)    LEFT SIDE   Orthopnea    Oxygen decrease    H/O HOME USE, NOT NOW   Personal history of  colonic polyps    Pneumonia    PSEUDOMONAL PNEUMONIA IN THE PAST   PVD (peripheral vascular disease) (HCC) 06/05/2014   Overview:  Smoker with poor pulses  Last Assessment & Plan:  No leg pain is noted of late.    Renal failure    PRERENAL   Seizures (HCC)    sounds like it could be vagal at times, coughing can cause him to fade out and see white lights. had last sz 3 months ago when not taking meds   Shortness of breath dyspnea    uses many different inhalers   Squamous cell carcinoma of skin 01/07/2020   right mid volar forearm, mid back spinal   Varicose veins of both lower extremities with pain 06/28/2017   Wheezing    Assessment: Steve Gregory is a 67 y.o. male presenting with acute onset of left-sided weakness with expressive dysphasia of questionable onset. PMH  significant for anxiety, depression, GERD, HTN, emphysema, PVD, Barrett's esophagus, anxiety, OA. Patient was on apixaban 5 mg BID PTA with last dose thought to be earlier this week. He was recently diagnosed with left lower extremity DVT and placed on Eliquis. 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 He was on anticoagulation at home. Per neurology, given the large size of the stroke, anticoagulation puts him at risk of hemorrhagic conversion but if that has to be used for PE, and recommended using heparin drip stroke protocol with lower HL/aPTT goals. Pharmacy has been consulted to initiate and manage heparin infusion.   Baseline Labs: aPTT 41, PT 18.4, INR 1.5, Hgb 9.9, Hct 31.6, Plt 122   Goal of Therapy:  Heparin level 03.-0.5 units/ml aPTT 66-85 seconds Monitor platelets by anticoagulation protocol: Yes   Date Time aPTT/HL Rate/Comment  6/28 2001 48/0.64 1200/aPTT subtherapeutic 6/29 0104 71/0.67 1350/aPTT therapeutic x 1 6/30 0458 75/0.42 1350/aPTT therapeutic x 2  Plan:  No boluses per MD aPTT and HL  starting to correlate Continue heparin infusion at 1350 units/hr Recheck aPTT daily w/ AM labs while therapeutic Check HL daily for correlation. Switch to HL monitoring once HL and aPTT correlation confirmed Continue to monitor H&H and platelets daily while on heparin infusion    Otelia Sergeant, PharmD, [facility] 02/27/2023 6:06 AM

## 2023-02-27 NOTE — Progress Notes (Signed)
  Interdisciplinary Goals of Care Family Meeting   Date carried out: 02/27/2023  Location of the meeting: Unit  Member's involved: [name] NP and patients brother Steve Gregory nad his wife  Durable Power of Attorney or acting medical decision maker: brother [name]  Discussion: We discussed goals of care for Steve Gregory .    Code status:   Code Status: DNR   Disposition: Continue current acute care  Time spent for the meeting: 45     Manuela Schwartz, NP  02/27/2023, 6:57 AM

## 2023-02-28 DIAGNOSIS — I2699 Other pulmonary embolism without acute cor pulmonale: Secondary | ICD-10-CM | POA: Diagnosis not present

## 2023-02-28 DIAGNOSIS — Z7189 Other specified counseling: Secondary | ICD-10-CM | POA: Diagnosis not present

## 2023-02-28 DIAGNOSIS — J9601 Acute respiratory failure with hypoxia: Secondary | ICD-10-CM | POA: Insufficient documentation

## 2023-02-28 DIAGNOSIS — A419 Sepsis, unspecified organism: Secondary | ICD-10-CM | POA: Diagnosis not present

## 2023-02-28 DIAGNOSIS — K219 Gastro-esophageal reflux disease without esophagitis: Secondary | ICD-10-CM | POA: Diagnosis not present

## 2023-02-28 DIAGNOSIS — I639 Cerebral infarction, unspecified: Secondary | ICD-10-CM | POA: Diagnosis not present

## 2023-02-28 DIAGNOSIS — R7881 Bacteremia: Secondary | ICD-10-CM | POA: Insufficient documentation

## 2023-02-28 LAB — HEPARIN LEVEL (UNFRACTIONATED): Heparin Unfractionated: 0.48 IU/mL (ref 0.30–0.70)

## 2023-02-28 LAB — CULTURE, BLOOD (ROUTINE X 2)

## 2023-02-28 LAB — CBC
HCT: 35.7 % — ABNORMAL LOW (ref 39.0–52.0)
Hemoglobin: 11.2 g/dL — ABNORMAL LOW (ref 13.0–17.0)
MCH: 28.9 pg (ref 26.0–34.0)
MCHC: 31.4 g/dL (ref 30.0–36.0)
MCV: 92.2 fL (ref 80.0–100.0)
Platelets: 206 10*3/uL (ref 150–400)
RBC: 3.87 MIL/uL — ABNORMAL LOW (ref 4.22–5.81)
RDW: 13.7 % (ref 11.5–15.5)
WBC: 20.3 10*3/uL — ABNORMAL HIGH (ref 4.0–10.5)
nRBC: 0 % (ref 0.0–0.2)

## 2023-02-28 LAB — GLUCOSE, CAPILLARY
Glucose-Capillary: 174 mg/dL — ABNORMAL HIGH (ref 70–99)
Glucose-Capillary: 159 mg/dL — ABNORMAL HIGH (ref 70–99)
Glucose-Capillary: 182 mg/dL — ABNORMAL HIGH (ref 70–99)
Glucose-Capillary: 188 mg/dL — ABNORMAL HIGH (ref 70–99)
Glucose-Capillary: 179 mg/dL — ABNORMAL HIGH (ref 70–99)
Glucose-Capillary: 178 mg/dL — ABNORMAL HIGH (ref 70–99)

## 2023-02-28 LAB — APTT: aPTT: 67 seconds — ABNORMAL HIGH (ref 24–36)

## 2023-02-28 MED ORDER — LORAZEPAM 2 MG/ML IJ SOLN
1.0000 mg | INTRAMUSCULAR | Status: DC | PRN
  Administered 2023-03-01 – 2023-03-02 (×3): 1 mg via INTRAVENOUS
  Filled 2023-02-28 (×3): qty 1

## 2023-02-28 MED ORDER — MORPHINE SULFATE (PF) 2 MG/ML IV SOLN
2.0000 mg | INTRAVENOUS | Status: DC | PRN
  Administered 2023-02-28 – 2023-03-03 (×11): 2 mg via INTRAVENOUS
  Filled 2023-02-28 (×11): qty 1

## 2023-02-28 NOTE — Progress Notes (Signed)
PT Cancellation Note  Patient Details Name: Steve Gregory MRN: [id number] DOB: 09/20/1955   Cancelled Treatment:    Reason Eval/Treat Not Completed: Other (comment). Per RN PT to hold on PT attempt this AM due to pt's respiratory status, PT to re-attempt as able.   Olga Coaster PT, DPT 11:21 AM,02/28/23

## 2023-02-28 NOTE — Progress Notes (Signed)
MEWS Progress Note  Patient Details Name: Steve Gregory MRN: [id number] DOB: [birth year] Today's Date: 02/28/2023   MEWS Flowsheet Documentation:  Assess: MEWS Score Temp: (!) 97.2 F (36.2 C) BP: (!) 150/84 MAP (mmHg): 103 Pulse Rate: (!) 108 ECG Heart Rate: (!) 106 Resp: (!) 27 Level of Consciousness: Responds to Voice SpO2: 90 % O2 Device: Nasal Cannula O2 Flow Rate (L/min): 10 L/min Assess: MEWS Score MEWS Temp: 0 MEWS Systolic: 0 MEWS Pulse: 1 MEWS RR: 2 MEWS LOC: 0 MEWS Score: 3 MEWS Score Color: Yellow Assess: SIRS CRITERIA SIRS Temperature : 0 SIRS Respirations : 1 SIRS Pulse: 1 SIRS WBC: 1 SIRS Score Sum : 3 SIRS Temperature : 0 SIRS Pulse: 1 SIRS Respirations : 1 SIRS WBC: 1 SIRS Score Sum : 3 Assess: if the MEWS score is Yellow or Red Were vital signs taken at a resting state?: Yes Focused Assessment: Change from prior assessment (see assessment flowsheet) Does the patient meet 2 or more of the SIRS criteria?: No MEWS guidelines implemented : Yes, red Treat MEWS Interventions: Considered administering scheduled or prn medications/treatments as ordered Take Vital Signs Increase Vital Sign Frequency : Red: Q1hr x2, continue Q4hrs until patient remains green for 12hrs Escalate MEWS: Escalate: Red: Discuss with charge nurse and notify provider. Consider notifying RRT. If remains red for 2 hours consider need for higher level of care Notify: Charge Nurse/RN Name of Charge Nurse/RN Notified: Nurse, mental health Provider Notification Provider Name/Title: Alleen Borne Date Provider Notified: 02/28/23 Time Provider Notified: 0900 Method of Notification: Page Notification Reason: Change in status Test performed and critical result: Lactic 2.7 Date Critical Result Received: 02/25/2023 Time Critical Result Received: 2045 Provider response: See new orders (CXR) Date of Provider Response: 02/28/23 Time of Provider Response: 0900  Patients respiratory effort had increased  from initial assessment at 0800.  Called Respiratory therapist who agreed and have nebulizer treatment and did NT suctioning.  Notified MD who ordered CXR.  Will continue to monitor.     Steve Gregory 02/28/2023, 11:42 AM

## 2023-02-28 NOTE — Progress Notes (Addendum)
Progress Note   Patient: Steve Gregory GNF:[id number] DOB: 09/29/1955 DOA: 02/08/2023     4 DOS: the patient was seen and examined on 02/28/2023   Brief hospital course: TRAEGAN CHARGUALAF is a 67 y.o. Caucasian male with medical history significant for anxiety, depression, GERD, essential hypertension, emphysema, peripheral vascular disease, Barrett's esophagus, anxiety and osteoarthritis, who presented to the emergency room with acute onset of left-sided weakness with expressive dysphasia of questionable onset.  The patient's brother stated that on Tuesday he told him he is eyes were crossed and he had double vision.  He was recently diagnosed with left lower extremity DVT and placed on Eliquis.    Noncontrasted head CT scan showed large right MCA territory acute infarction with no hemorrhage or mass effect. 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 Focal filling defects involving the anterior communicating artery complex and proximal right A2 segment, likely subocclusive thrombus. Large evolving right MCA/ACA distribution infarct. MRI brain confirmed the findings. CTA chest showed - segmental and subsegmental PE to the right lower lobe, increasing mediastinal lymphadenopathy and right hilar nodal stations concerning for potential metastatic disease, increasing areas of airspace consolidation right lung most severe in right middle and lower lobes concerning for pneumonia, diffuse bronchial wall thickening, aortic atherosclerosis and calcifications of attic valve.  Assessment and Plan: * Acute cerebral infarction [facility]) Acute MCA/ACA territory infarction with subsequent left-sided hemiplegia. Continue neurochecks as per protocol.  His mental status is poor. Continue telemetry. Continue aspirin, statin therapy. Neurology follow up appreciated. Repeat CT increased mass effect 5mm midline shift. Overall prognosis poor. Discussed with neurologist. No DOAC at this  time. PT OT once more awake N.p.o. for now due to aspiration risk. Hold oral medications. Palliative team discussing further with family, he is DNR.  Sepsis due to undetermined organism (HCC) Right middle and lower lobe pneumonia  Blood cultures positive for Staph, bacilllus CTA chest reveals PE, right sided pneumonia.  Due to aspiration. Secretions worsening, tachypnea noted. Rocephin changed to unasyn therapy.  Continue frequent suctioning. Aspiration precautions. Repeat blood cultures so far negative. Continue with gentle IV fluids as he is NPO.  Acute hypoxic respiratory failure In the setting of aspiration pneumonia, PE, stroke. H eis now requiring 9L oxygen. Continue Unasyn therapy. Continue bronchodilators, pulmonary toilet. Monitor vitals closely. Supplemental oxygen as need to maintain saturation above 92%.  Right-sided pulmonary embolism History of deep venous thrombosis (DVT) of distal vein of left lower extremity. Continue heparin drip per pharmacy protocol. Continue to monitor APTT. No transition to oral anticoagulation at this time due to large stroke, hemorrhagic conversion.  GERD without esophagitis Continue IV PPI.  History of lung cancer status post SBRT Chronic obstructive pulmonary disease (COPD) (HCC) Continue DuoNebs, home inhalers. CTA chest revealed worsening lymphadenopathy potential metastatic disease.  Oncology recommended PET/CT once he is more stable.His overall prognosis is poor, family understands and agree with DNR, wishes to continue current care.  DVT prophylaxis-on heparin drip. Fall, seizure and aspiration precautions. CODE STATUS DNR.  Palliative team on board for goals of care discussion.      Subjective: Patient is seen and examined today morning. He is awake able to follow simple commands. Respiratory secretions, respiratory distress noted. Patient is on 9L supplemental oxygen to maintain saturation above 90%  Physical Exam: Vitals:    02/28/23 0731 02/28/23 0900 02/28/23 1000 02/28/23 1100  BP: 121/72 (!) 160/94 (!) 157/95 (!) 150/84  Pulse: 99 (!) 114 Marland Kitchen)  119 (!) 108  Resp: 18 (!) 27 (!) 28 (!) 27  Temp: (!) 97.2 F (36.2 C)     TempSrc:      SpO2: 98% 96% (!) 89% 90%  Weight:      Height:       General - Elderly Caucasian male, lethargic obtunded. Moderate respiratory distress HEENT - PERRLA, EOMI, atraumatic head, non tender sinuses. Lung - Clear, diffuse rhonchi, wheezes, tachypnea Heart - S1, S2 heard, no murmurs, rubs, trace pedal edema Neuro -sleepy, lethargic, unable to do full neuro exam. Skin - Warm and dry. Data Reviewed:  CBC, aptt, blood sugars, blood/urine cultures  Family Communication: Discussed with brother, wants patient comfortable but also want to continue medical treatment. Agrees to give morphine, ativan as needed. Orders placed. I will continue to talk to him and advise comfort measures only.  Disposition: Status is: Inpatient Remains inpatient appropriate because: large stroke with midline shift  Planned Discharge Destination:  unsure at this time , prognosis poor    MDM level 3- patient has h/o lung cancer, DVT with large stroke, multiple PE, respiratory failure. Poor prognosis. He is at high risk for sudden clinical deterioration.   Author: Marcelino Duster, MD 02/28/2023 11:43 AM  For on call review [email or web address].

## 2023-02-28 NOTE — Progress Notes (Signed)
Neurology Progress Note   S:// Continues to require deep suctioning due to increased secretions. Wants something to drink, denies headache or abdominal pain   O:// Current vital signs: BP (!) 152/93   Pulse (!) 106   Temp (!) 97.3 F (36.3 C) (Axillary)   Resp (!) 29   Ht 5\' 9"  (1.753 m)   Wt 71.9 kg   SpO2 94%   BMI 23.42 kg/m  Vital signs in last 24 hours: Temp:  [97 F (36.1 C)-97.9 F (36.6 C)] 97.3 F (36.3 C) (07/01 1157) Pulse Rate:  [87-124] 106 (07/01 1200) Resp:  [14-29] 29 (07/01 1200) BP: (108-160)/(63-95) 152/93 (07/01 1200) SpO2:  [89 %-98 %] 94 % (07/01 1200) General: Cachectic appearing sleepy / slow to respond HEENT: Normocephalic atraumatic Lungs: Wheezing and rales with very increased work of breathing up in the bed at near 90 degrees Abdomen nondistended nontender Neurological exam Eyes open, follows commands to show two fingers and wiggle toes on the right. Left sided neglect Right gaze preference Does not blink to threat from the left Left facial droop Left hemiplegia, triple flexion to light tickle on the left lower extremity  Sensory loss on the left. Coordination difficult to assess  Medications  Current Facility-Administered Medications:    acetaminophen (TYLENOL) tablet 650 mg, 650 mg, Oral, Q4H PRN **OR** acetaminophen (TYLENOL) 160 MG/5ML solution 650 mg, 650 mg, Per Tube, Q4H PRN **OR** acetaminophen (TYLENOL) suppository 650 mg, 650 mg, Rectal, Q4H PRN, Mansy, Jan A, MD, 650 mg at 02/26/23 1755   albuterol (PROVENTIL) (2.5 MG/3ML) 0.083% nebulizer solution 3 mL, 3 mL, Inhalation, Q6H PRN, Mansy, Jan A, MD, 3 mL at 02/28/23 1018   Ampicillin-Sulbactam (UNASYN) 3 g in sodium chloride 0.9 % 100 mL IVPB, 3 g, Intravenous, Q6H, Belue, Lendon Collar, RPH, Last Rate: 200 mL/hr at 02/28/23 0523, 3 g at 02/28/23 0523   azithromycin (ZITHROMAX) 500 mg in sodium chloride 0.9 % 250 mL IVPB, 500 mg, Intravenous, Q24H, Mansy, Jan A, MD, Last Rate: 250 mL/hr  at 02/28/23 0123, 500 mg at 02/28/23 0123   dextrose 5 % in lactated ringers infusion, , Intravenous, Continuous, Manuela Schwartz, NP, Last Rate: 100 mL/hr at 02/28/23 0521, New Bag at 02/28/23 0521   fluticasone (FLONASE) 50 MCG/ACT nasal spray 2 spray, 2 spray, Each Nare, Daily PRN, Mansy, Jan A, MD   glycopyrrolate (ROBINUL) injection 0.2 mg, 0.2 mg, Intravenous, Q4H PRN, Manuela Schwartz, NP, 0.2 mg at 02/28/23 0137   heparin ADULT infusion 100 units/mL (25000 units/239mL), 1,350 Units/hr, Intravenous, Continuous, Coulter, Carolyn, RPH, Last Rate: 13.5 mL/hr at 02/27/23 2320, 1,350 Units/hr at 02/27/23 2320   losartan (COZAAR) tablet 50 mg, 50 mg, Oral, Daily, Mansy, Jan A, MD   magnesium hydroxide (MILK OF MAGNESIA) suspension 30 mL, 30 mL, Oral, Daily PRN, Mansy, Jan A, MD   methylPREDNISolone sodium succinate (SOLU-MEDROL) 40 mg/mL injection 40 mg, 40 mg, Intravenous, Q12H, Manuela Schwartz, NP, 40 mg at 02/28/23 1610   mirtazapine (REMERON) tablet 7.5 mg, 7.5 mg, Oral, QHS, Mansy, Jan A, MD   montelukast (SINGULAIR) tablet 10 mg, 10 mg, Oral, Daily, Mansy, Jan A, MD   ondansetron [facility]) injection 4 mg, 4 mg, Intravenous, Q4H PRN, Mansy, Jan A, MD   pantoprazole (PROTONIX) injection 40 mg, 40 mg, Intravenous, BID WC, Nazari, Walid A, RPH, 40 mg at 02/28/23 0825   senna-docusate (Senokot-S) tablet 1 tablet, 1 tablet, Oral, QHS PRN, Mansy, Jan A, MD   theophylline (UNIPHYL) 400 MG 24 hr tablet  400 mg, 400 mg, Oral, Daily, Mansy, Jan A, MD   traZODone (DESYREL) tablet 25 mg, 25 mg, Oral, QHS PRN, Mansy, Vernetta Honey, MD  Labs   Basic Metabolic Panel: Recent Labs  Lab 02/12/2023 1814 02/12/2023 2343 02/26/23 0547 02/27/23 0458  NA 140 141 139 140  K 3.9 3.3* 3.8 3.7  CL 102 107 109 110  CO2 23 22 20* 22  GLUCOSE 88 77 107* 127*  BUN 20 17 17 16   CREATININE 1.13 0.98 0.93 0.81  CALCIUM 9.0 7.5* 8.5* 8.3*  MG  --  1.4*  --  1.8    CBC: Recent Labs  Lab 02/07/2023 1814 01/30/2023 2343  02/26/23 0547 02/27/23 0458 02/28/23 0730  WBC 16.9* 15.0* 14.9* 14.4* 20.3*  NEUTROABS 13.7* 12.1*  --  11.5*  --   HGB 12.2* 9.9* 10.7* 10.9* 11.2*  HCT 37.8* 31.6* 33.1* 34.6* 35.7*  MCV 90.6 92.4 90.4 92.0 92.2  PLT 140* 122* 120* 145* 206    Coagulation Studies: No results for input(s): "LABPROT", "INR" in the last 72 hours.   Lipid Panel     Component Value Date/Time   CHOL 204 (H) 02/25/2023 0156   TRIG 128 02/25/2023 0156   HDL 33 (L) 02/25/2023 0156   CHOLHDL 6.2 02/25/2023 0156   VLDL 26 02/25/2023 0156   LDLCALC 145 (H) 02/25/2023 0156    Lab Results  Component Value Date   HGBA1C 6.0 (H) 01/30/2023    2D echocardiogram shows EF 40 to 45% with mildly decreased function of the left ventricle.  Left ventricle grade 1 diastolic dysfunction-impaired relaxation.  RV systolic function normal.  Mitral valve grossly normal.  Aortic valve not well-visualized.  Bubble study of limited quality but does not show a large intracardiac shunt.  Left atrium size normal  Imaging I have reviewed images in epic and the results pertinent to this consultation are: CT-head large right hemispheric acute infarct CT angiography head and neck: Acute occlusion of the cervical right ICA just distal to the bifurcation with distal reconstitution via the ophthalmic artery.  Right MCA and its distal branches are attenuated but remain patent.  Focal filling defect involving the anterior communicating artery complex and proximal right A2 segment likely subocclusive thrombus.  Right ACA is attenuated but patent distally.  Large evolving right MCA and ACA distribution infarct.  6 mm left MCA bifurcation aneurysm.  Severe emphysema with irregular posttreatment changes within the left upper lobe similar to prior CT.  Superimposed hazy and patchy opacities favored to reflect edema or atelectasis.  Enlarged mediastinal and hilar lymphadenopathy on the right.   MRI examination of the brain-large evolving right  ACA and MCA distribution infarcts.  Additional patchy small volume infarcts in the contralateral left cerebral hemisphere, left thalamus and bilateral cerebellar hemispheres.  No associated hemorrhage or mass effect.  Loss of normal flow void within the right ICA to the siphon consistent with previously identified occlusion of the right ICA on the CT angiography head and neck.  Underlying age-related atrophy and small vessel disease.   CT angio chest PE protocol: Nonocclusive segmental and subsegmental pulmonary emboli to the right lower lobe.  No findings of right heart strain at this time.  Increased lymphadenopathy in the mediastinum and right hilar nodal stations concerning for potential metastatic disease in this patient with a history of lung cancer.  Repeat PET/CT should be considered if appropriate.  Increasing areas of airspace consolidation in the right lung most severe in the right middle  and lower lobes concerning for probable pneumonia.  Diffuse bronchial wall thickening and moderate centrilobular and paraseptal emphysema-suggestive of underlying COPD.  Aortic atherosclerosis in addition to left main and left anterior descending coronary artery disease.  Thickening and calcification of the aortic valve.  CT head done due to increasing somnolence yesterday with no acute findings.  Large right ACA MCA infarct with confluent cytotoxic edema and scattered additional small left cerebellar and bilateral hemisphere infarcts as seen on prior MRI.  No malignant hemorrhagic transformation but increased intracranial mass effect send 02/19/2023 now up to 5 mm of leftward midline shift at the anterior septum pellucidum.  Ventricular mass effect with no ventriculomegaly.  Assessment:  67 year old man history of lung cancer status post SBRT, COPD, DVT on anticoagulation at home, dysphagia, headaches, peripheral vascular disease presented to the ER after being found down and not being heard from the family with  last known well 2 days prior to presentation.  CT head with large right ACA/MCA territory infarcts, MRI confirms infarct with CT angiography of the head and neck consistent with occlusion of the right internal carotid artery cervical portion just distal to the bifurcation along with the continuation of right MCA branches.  Also right A2 segment with subocclusive thrombus. Etiology likely hypercoagulability versus a cardioembolic source.  Repeat head CT with some increased intracranial mass effect without herniation or ventriculomegaly.  Impression: Large acute ischemic stroke involving the right ACA and right MCA territory-likely emboli from hypercoagulability versus cardioembolic source. History of lung cancer COPD  Recommendations: Low threshold for repeat head CT if anything changes neurologically Continue close neuromonitoring, Telemetry and frequent neurochecks Palliative care consultation has been obtained.  Dr Wilford Corner spoke with the brother in detail and explained that his outcomes from a neurological meaningful recovery are going to be extremely poor and they should consider comfort measures.  Palliative team has also been consulted.  Patient has a 68 year old mother who would like to come see the patient before any decisions are being made.  They are understanding of the severity but still hoping for miracles. A1c at goal less than 7 LDL at goal.  High intensity statin  On heparin stroke protocol-continue for now.  Change to long term systemic anticoagulation in about a week but given that he still has some mass effect intracranially, I would hold off on this till about 10 days from the last known well which will be around March 04, 2023 (if it remains within goals of care). No need for TEE as he is already on anticoagulation Avoid hypotension.  Blood pressure goal for another day or so of systolic 120-160 and after that start normalizing blood pressure to a goal of 140/90 on  discharge. Management will PE and lung cancer per oncology--input from oncology may also be of help to direct goals of care conversations Management of work of breathing per primary team and oncology Therapy assessments.   I would continue ongoing goals of care conversations given his cancer, COPD and now a large stroke that involves majority of his right hemisphere, a good quality of life would likely not be attainable.   Plan relayed to Dr. Clide Dales via secure chat.  Appreciate PMT assistance    Brooke Dare MD-PhD [facility] [phone] Available 7 AM to 7 PM, outside these hours please contact Neurologist on call listed on AMION  > 35 min spent in care of patient, majority at bedside

## 2023-02-28 NOTE — Progress Notes (Signed)
Paged respiratory therapist to assess patient due to increased work of breathing and tachypnea. She came to bedside and gave neb treatment, NT suction also done.  Will continue to monitor.

## 2023-02-28 NOTE — Progress Notes (Signed)
MEWS Progress Note  Patient Details Name: Steve Gregory MRN: [id number] DOB: 12/14/1955 Today's Date: 02/28/2023   MEWS Flowsheet Documentation:  Assess: MEWS Score Temp: (!) 97.2 F (36.2 C) BP: (!) 154/92 MAP (mmHg): 111 Pulse Rate: (!) 109 ECG Heart Rate: (!) 109 Resp: (!) 27 Level of Consciousness: Responds to Voice SpO2: 94 % O2 Device: Nasal Cannula O2 Flow Rate (L/min): 10 L/min Assess: MEWS Score MEWS Temp: 0 MEWS Systolic: 0 MEWS Pulse: 1 MEWS RR: 2 MEWS LOC: 1 MEWS Score: 4 MEWS Score Color: Red Assess: SIRS CRITERIA SIRS Temperature : 0 SIRS Respirations : 1 SIRS Pulse: 1 SIRS WBC: 1 SIRS Score Sum : 3 SIRS Temperature : 0 SIRS Pulse: 1 SIRS Respirations : 1 SIRS WBC: 1 SIRS Score Sum : 3 Assess: if the MEWS score is Yellow or Red Were vital signs taken at a resting state?: Yes Focused Assessment: No change from prior assessment Does the patient meet 2 or more of the SIRS criteria?: No MEWS guidelines implemented : No, previously red, continue vital signs every 4 hours Treat MEWS Interventions: Considered administering scheduled or prn medications/treatments as ordered Take Vital Signs Increase Vital Sign Frequency : Red: Q1hr x2, continue Q4hrs until patient remains green for 12hrs Escalate MEWS: Escalate: Red: Discuss with charge nurse and notify provider. Consider notifying RRT. If remains red for 2 hours consider need for higher level of care Notify: Charge Nurse/RN Name of Charge Nurse/RN Notified: Nurse, mental health Provider Notification Provider Name/Title: Alleen Borne Date Provider Notified: 02/28/23 Time Provider Notified: 0900 Method of Notification: Page Notification Reason: Change in status Test performed and critical result: Lactic 2.7 Date Critical Result Received: 02/22/2023 Time Critical Result Received: 2045 Provider response: See new orders (CXR) Date of Provider Response: 02/28/23 Time of Provider Response: 0900  MD rounding and aware of  poor respiratory status.  Discussing with family care options.     [name] 02/28/2023, 5:22 PM

## 2023-02-28 NOTE — Progress Notes (Signed)
SLP Cancellation Note  Patient Details Name: Steve Gregory MRN: [id number] DOB: 1956-06-14   Cancelled treatment:       Reason Eval/Treat Not Completed: Medical issues which prohibited therapy/ Pt continues to be at high risk for aspiration/aspiration PNA given need for NT suctioning overnight due to presumably impaired secretion management. Will defer POs at this time. Noted GOC discussions ongoing.  [name], M.S., CCC-SLP Speech-Language Pathologist [facility] [phone] (ASCOM)  [name] 02/28/2023, 8:08 AM

## 2023-02-28 NOTE — Progress Notes (Signed)
Nutrition Brief Note  Chart reviewed. Per MD notes, pt leaning towards comfort care. Per palliative care notes, no plans for invasive procedures. Pt with poor prognosis.  Pt remains NPO per SLP notes. Pt requiring suctioning overnight secondary to impaired secretion management. Artificial means of nutrition and hydration would not decrease aspiration risk if pt unable to manage his own secretions.  No further nutrition interventions planned at this time.  Please re-consult as needed.   Levada Schilling, RD, LDN, CDCES Registered Dietitian II Certified Diabetes Care and Education Specialist Please refer to [facility] for RD and/or RD on-call/weekend/after hours pager

## 2023-02-28 NOTE — Consult Note (Signed)
ANTICOAGULATION CONSULT NOTE  Pharmacy Consult for Heparin Indication: pulmonary embolus and stroke  Allergies  Allergen Reactions   Chantix [Varenicline] Swelling        Lipitor [Atorvastatin] Swelling   Mobic [Meloxicam] Other (See Comments)    Myalgia   Statins Other (See Comments)    Myalgia    Patient Measurements: Height: 5\' 9"  (175.3 cm) Weight: 71.9 kg (158 lb 9.6 oz) IBW/kg (Calculated) : 70.7 Heparin Dosing Weight: 71.9 kg  Vital Signs: Temp: 97.2 F (36.2 C) (07/01 0731) BP: 121/72 (07/01 0731) Pulse Rate: 99 (07/01 0731)  Labs: Recent Labs    02/26/23 0104 02/26/23 0104 02/26/23 0547 02/27/23 0458 02/28/23 0730  HGB  --    < > 10.7* 10.9* 11.2*  HCT  --   --  33.1* 34.6* 35.7*  PLT  --   --  120* 145* 206  APTT 71*  --  75* 75* 67*  HEPARINUNFRC 0.67  --   --  0.42 0.48  CREATININE  --   --  0.93 0.81  --    < > = values in this interval not displayed.    Estimated Creatinine Clearance: 89.7 mL/min (by C-G formula based on SCr of 0.81 mg/dL).  Medical History: Past Medical History:  Diagnosis Date   Allergy    Anxiety    Arthritis    Barrett's esophagus    Bronchitis, chronic (HCC)    Cancer (HCC)    LUNG   COPD (chronic obstructive pulmonary disease) (HCC)    Cough    CHRONIC   Depression    Dizziness    DVT (deep venous thrombosis) (HCC)    Dysphagia    Essential hypertension 06/28/2017   GERD (gastroesophageal reflux disease)    H/O emphysema (HCC)    Headache    History of depression 06/05/2014   Last Assessment & Plan:  Mood is doing well on medications.    History of hiatal hernia    Hoarseness of voice    partial paralyzed vocal cord   HOH (hard of hearing)    Hypertension    Lung mass    Maxillary fracture (HCC)    LEFT SIDE   Orthopnea    Oxygen decrease    H/O HOME USE, NOT NOW   Personal history of colonic polyps    Pneumonia    PSEUDOMONAL PNEUMONIA IN THE PAST   PVD (peripheral vascular disease) (HCC)  06/05/2014   Overview:  Smoker with poor pulses  Last Assessment & Plan:  No leg pain is noted of late.    Renal failure    PRERENAL   Seizures (HCC)    sounds like it could be vagal at times, coughing can cause him to fade out and see white lights. had last sz 3 months ago when not taking meds   Shortness of breath dyspnea    uses many different inhalers   Squamous cell carcinoma of skin 01/07/2020   right mid volar forearm, mid back spinal   Varicose veins of both lower extremities with pain 06/28/2017   Wheezing    Assessment: Steve Gregory is a 67 y.o. male presenting with acute onset of left-sided weakness with expressive dysphasia of questionable onset. PMH significant for anxiety, depression, GERD, HTN, emphysema, PVD, Barrett's esophagus, anxiety, OA. Patient was on apixaban 5 mg BID PTA with last dose thought to be earlier this week. He was recently diagnosed with left lower extremity DVT and placed on Eliquis. 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 He was on anticoagulation at home. Per neurology, given the large size of the stroke, anticoagulation puts him at risk of hemorrhagic conversion but if that has to be used for PE, and recommended using heparin drip stroke protocol with lower HL/aPTT goals. Pharmacy has been consulted to initiate and manage heparin infusion.   Baseline Labs: aPTT 41, PT 18.4, INR 1.5, Hgb 9.9, Hct 31.6, Plt 122   Goal of Therapy:  Heparin level 03.-0.5 units/ml aPTT 66-85 seconds Monitor platelets by anticoagulation protocol: Yes   Date Time aPTT/HL Rate/Comment  6/28 2001 48/0.64 1200/aPTT subtherapeutic 6/29 0104 71/0.67 1350/aPTT therapeutic x 1 6/30 0458 75/0.42 1350/aPTT therapeutic x 2 07/01 0730 67/0.48  Continue 1350 un/hr  Plan:  No boluses per MD Continue heparin infusion at 1350 units/hr aPTT and HL correlating. Will continue  to monitor HL Hgb and plt stable Continue to monitor H&H and platelets daily while on heparin infusion    Corbitt Cloke Rodriguez-Guzman PharmD, BCPS 02/28/2023 8:24 AM

## 2023-02-28 NOTE — Progress Notes (Signed)
Daily Progress Note   Patient Name: Steve Gregory       Date: 02/28/2023 DOB: 1955-12-24  Age: 67 y.o. MRN#: [id number] Attending Physician: Marcelino Duster, MD Primary Care Physician: Lauro Regulus, MD Admit Date: 02/07/2023  Reason for Consultation/Follow-up: Establishing goals of care  Subjective: Notes and labs reviewed. Brother is only contact listed. In to see patient.  Patient is sitting in bed with significant work of breathing noted using intercostal muscles and neck muscles.  He is tachypneic. O2 sat is 92% at this time.  Patient does not look at me or attempt to speak.  Patient's brother is at bedside.  Discussed updates and concern for his status.  Brother states that he and his sister and mother together are surrogate decision makers.  He states there is not a time that they can be here together and he states his mother is very elderly and will not be coming to the hospital.    Brother does not wish to speak in front of patient and so we stepped out.  We discussed an aggressive path, and a comfort path.  We discussed suffering and air hunger.  He states that they spoke with Ladona Ridgel with PMT yesterday and were clear that they want a few more days to make a decision.  We discussed his respiratory status and that given his current breathing he may become unstable at any time, and unable to sustain his breathing.  Retouched on options of aggressive/escalating care or comfort measures.  Brother states that the family is not prepared to make any decisions and wants him to continue exactly as he is with no change in care; he discusses he would not want any escalation in care, and he would not want any comfort measures at this time.  He states that he understands his brother is suffering,  and when the [facility]une Poll[facility] is ready he will end his suffering and take him home.  He states he and his family would like a few more days to make decisions, and if the [facility] takes him during that time, then that is the Lord's will.  He states he does not want to discuss this any further.   Stepped out and updated nursing and updated attending who states he will return to bedside to speak with family.   Length  of Stay: 4  Current Medications: Scheduled Meds:   losartan  50 mg Oral Daily   methylPREDNISolone (SOLU-MEDROL) injection  40 mg Intravenous Q12H   mirtazapine  7.5 mg Oral QHS   montelukast  10 mg Oral Daily   pantoprazole (PROTONIX) IV  40 mg Intravenous BID WC   theophylline  400 mg Oral Daily    Continuous Infusions:  ampicillin-sulbactam (UNASYN) IV 3 g (02/28/23 1322)   azithromycin 500 mg (02/28/23 0123)   dextrose 5% lactated ringers 100 mL/hr at 02/28/23 0521   heparin 1,350 Units/hr (02/27/23 2320)    PRN Meds: acetaminophen **OR** acetaminophen (TYLENOL) oral liquid 160 mg/5 mL **OR** acetaminophen, albuterol, fluticasone, glycopyrrolate, magnesium hydroxide, ondansetron (ZOFRAN) IV, senna-docusate, traZODone  Physical Exam Constitutional:      Comments: Eyes open  Pulmonary:     Comments: Tachypnea.  Work of breathing noted.            Vital Signs: BP (!) 146/85   Pulse (!) 105   Temp (!) 97.3 F (36.3 C) (Oral)   Resp (!) 25   Ht 5\' 9"  (1.753 m)   Wt 71.9 kg   SpO2 92%   BMI 23.42 kg/m  SpO2: SpO2: 92 % O2 Device: O2 Device: Nasal Cannula O2 Flow Rate: O2 Flow Rate (L/min): 10 L/min  Intake/output summary:  Intake/Output Summary (Last 24 hours) at 02/28/2023 1613 Last data filed at 02/28/2023 1600 Gross per 24 hour  Intake 1080 ml  Output 200 ml  Net 880 ml   LBM:   Baseline Weight: Weight: 71.9 kg Most recent weight: Weight: 71.9 kg    Patient Active Problem List   Diagnosis Date Noted   Acute hypoxic respiratory failure (HCC) 02/28/2023    Bacteremia 02/28/2023   Acute pulmonary embolism (HCC) 02/25/2023   Right middle lobe pneumonia 02/25/2023   Acute cerebral infarction (HCC) 02/09/2023   Chronic obstructive pulmonary disease (COPD) (HCC) 02/19/2023   GERD without esophagitis 02/23/2023   History of deep venous thrombosis (DVT) of distal vein of left lower extremity 02/04/2023   Peripheral neuropathy 02/07/2023   Sepsis due to undetermined organism (HCC) 02/07/2023   COPD exacerbation (HCC) 11/12/2019   Myalgia due to statin 07/12/2019   Goals of care, counseling/discussion 08/21/2018   Neurogenic pain 07/10/2018   Chronic pain syndrome 07/10/2018   Vitamin D insufficiency 04/18/2018   Pharmacologic therapy 04/11/2018   Disorder of skeletal system 04/11/2018   Problems influencing health status 04/11/2018   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   Lumbosacral radiculopathy at S1 01/03/2018   Chronic bilateral low back pain with bilateral sciatica 01/03/2018   Lumbar degenerative disc disease 01/03/2018   Cervicalgia 01/03/2018   Lung mass 07/11/2017   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) 07/11/2017   Lower extremity pain, bilateral 06/28/2017   Varicose veins of both lower extremities with pain 06/28/2017   Tobacco dependence 06/28/2017   Hyperglycemia 03/07/2017   Prediabetes 03/07/2017   Healthcare maintenance 11/04/2016   History of depression 06/05/2014   PVD (peripheral vascular disease) (HCC) 06/05/2014   HTN (hypertension), benign 02/22/2014   GERD (gastroesophageal reflux disease) 02/22/2014   COPD (chronic obstructive pulmonary disease) with emphysema (HCC) 02/21/2014    Palliative Care Assessment & Plan    Recommendations/Plan: Brother at bedside states that family is not ready to provide more aggressive care, and is not ready to shift to comfort care.  Updated nursing and attending.  Code Status:    Code  Status Orders  (From admission, onward)            Start     Ordered   02/27/23 0635  Do not attempt resuscitation (DNR)  Continuous       Question Answer Comment  If patient has no pulse and is not breathing Do Not Attempt Resuscitation   If patient has a pulse and/or is breathing: Medical Treatment Goals LIMITED ADDITIONAL INTERVENTIONS: Use medication/IV fluids and cardiac monitoring as indicated; Do not use intubation or mechanical ventilation (DNI), also provide comfort medications.  Transfer to Progressive/Stepdown as indicated, avoid Intensive Care.   Consent: Discussion documented in EHR or advanced directives reviewed      02/27/23 0634           Code Status History     Date Active Date Inactive Code Status Order ID Comments User Context   01/29/2023 2023 02/27/2023 0634 Full Code [id number]  [facility] Vernetta Honey, MD ED   11/12/2019 2031 11/13/2019 1506 Full Code [id number]  Mansy, Vernetta Honey, MD ED       Prognosis: Poor   Thank you for allowing the Palliative Medicine Team to assist in the care of this patient.    Morton Stall, NP  Please contact Palliative Medicine Team phone at [phone] for questions and concerns.

## 2023-02-28 NOTE — Progress Notes (Signed)
Pt very rhonchorous again.  Unable to generate effective cough.  NT suctioned lg amt of thick tan secretions. Pt very uncomfortable during procedure but bs were much improved afterward

## 2023-02-28 NOTE — Plan of Care (Signed)
Patients respiratory status is worsening today.  Palliative is discussing going comfort care.

## 2023-02-28 NOTE — TOC CM/SW Note (Signed)
Transition of Care [facility]) - Inpatient Brief Assessment   Patient Details  Name: Steve Gregory MRN: [id number] Date of Birth: 11-Nov-1955  [facility] CM/SW Contact:    Margarito Liner, LCSW Phone Number: 02/28/2023, 2:14 PM   Clinical Narrative: Patient currently has recommendations for SNF. Palliative is following and discussing goals of care with brother and mother. Patient was having respiratory issues today and is currently on 10 L oxygen. CSW will continue to follow progress and start SNF discussion when appropriate.  Transition of Care Asessment: Insurance and Status: Insurance coverage has been reviewed Patient has primary care physician: Yes Home environment has been reviewed: Home alone Prior level of function:: Independent Prior/Current Home Services: No current home services Social Determinants of Health Reivew: SDOH reviewed no interventions necessary Readmission risk has been reviewed: Yes Transition of care needs: transition of care needs identified, TOC will continue to follow

## 2023-02-28 NOTE — Progress Notes (Signed)
Brother at bedside.  Discussed with brother patients worsening respiratory status. His increased need for HFNC and suctioning.   Notified MD and palliative NP that family was at bedside to continue discussion of goals of care.

## 2023-02-28 NOTE — Progress Notes (Signed)
OT Cancellation Note  Patient Details Name: TEVIS KIRIN MRN: [id number] DOB: 1956/06/11   Cancelled Treatment:    Reason Eval/Treat Not Completed: Medical issues which prohibited therapy. Per RN PT to hold on therapy attempt this AM due to pt's respiratory status, will re-attempt as able.   Kathie Dike, M.S. OTR/L  02/28/23, 1:25 PM  ascom [phone]

## 2023-02-28 DEATH — deceased

## 2023-03-01 DIAGNOSIS — Z7189 Other specified counseling: Secondary | ICD-10-CM | POA: Diagnosis not present

## 2023-03-01 DIAGNOSIS — K219 Gastro-esophageal reflux disease without esophagitis: Secondary | ICD-10-CM | POA: Diagnosis not present

## 2023-03-01 DIAGNOSIS — A419 Sepsis, unspecified organism: Secondary | ICD-10-CM | POA: Diagnosis not present

## 2023-03-01 DIAGNOSIS — I2699 Other pulmonary embolism without acute cor pulmonale: Secondary | ICD-10-CM | POA: Diagnosis not present

## 2023-03-01 DIAGNOSIS — I639 Cerebral infarction, unspecified: Secondary | ICD-10-CM | POA: Diagnosis not present

## 2023-03-01 LAB — GLUCOSE, CAPILLARY
Glucose-Capillary: 170 mg/dL — ABNORMAL HIGH (ref 70–99)
Glucose-Capillary: 182 mg/dL — ABNORMAL HIGH (ref 70–99)
Glucose-Capillary: 164 mg/dL — ABNORMAL HIGH (ref 70–99)
Glucose-Capillary: 152 mg/dL — ABNORMAL HIGH (ref 70–99)

## 2023-03-01 LAB — CULTURE, BLOOD (ROUTINE X 2)
Culture: NO GROWTH
Special Requests: ADEQUATE

## 2023-03-01 LAB — HEPARIN LEVEL (UNFRACTIONATED): Heparin Unfractionated: 0.61 IU/mL (ref 0.30–0.70)

## 2023-03-01 NOTE — Progress Notes (Addendum)
Physical Therapy Treatment Patient Details Name: Steve Gregory MRN: [id number] DOB: [birth year] Today's Date: 03/01/2023   History of Present Illness 67 y/o male presented to ED on 02/14/2023 after being found unresponsive in home. Found to have incomprehensible speech and R sided gaze. MRI found large evolving R ACA and MCA infarcts. PMH: anxiety, depression, HTN, emphysema, PVD, Barrett's esophagus    PT Comments  Pt in bed, OT and PT co-treat for patient safety and mobility needs. Pt performed supine>sit c/ maxA+2 with cueing for RLE sequencing, sit>supine totalA+2, sitting balance maxA+2 for L lateral/posterior lean and trunk control, pt fluctuates postural support with VC. Pt tolerated sitting EOB for ~5 mins. Pt able to complete exercises in bed, able to follow one step command inconsistently, unable to follow multi-step. Pt would benefit from skilled PT interventions to improve overall mobility and functional activity tolerance.     Assistance Recommended at Discharge Frequent or constant Supervision/Assistance  If plan is discharge home, recommend the following:  Can travel by private vehicle    Two people to help with walking and/or transfers;Two people to help with bathing/dressing/bathroom;Assistance with cooking/housework;Assistance with feeding;Direct supervision/assist for medications management;Direct supervision/assist for financial management;Assist for transportation;Help with stairs or ramp for entrance   No  Equipment Recommendations  Other (comment)    Recommendations for Other Services       Precautions / Restrictions Precautions Precautions: Fall Precaution Comments: L hemi, pushes to L Restrictions Weight Bearing Restrictions: No     Mobility  Bed Mobility Overal bed mobility: Needs Assistance Bed Mobility: Supine to Sit, Sit to Supine     Supine to sit: Max assist, +2 for physical assistance Sit to supine: Total assist, +2 for physical assistance, +2 for  safety/equipment   General bed mobility comments: maxA+2 but able to initiate RLE movement to EOB for supine>sit    Transfers                   General transfer comment: unsafe to attempt    Ambulation/Gait                   Stairs             Wheelchair Mobility     Tilt Bed    Modified Rankin (Stroke Patients Only)       Balance Overall balance assessment: Needs assistance Sitting-balance support: Feet supported, Single extremity supported Sitting balance-Leahy Scale: Zero Sitting balance - Comments: L lateral lean, infrequent righting with fluctuating pushing Postural control: Left lateral lean, Posterior lean                                  Cognition Arousal/Alertness: Awake/alert Behavior During Therapy: Flat affect Overall Cognitive Status: Impaired/Different from baseline Area of Impairment: Attention, Memory, Following commands, Awareness                       Following Commands: Follows one step commands inconsistently                Exercises Total Joint Exercises Ankle Circles/Pumps: AROM, Right, 10 reps, Supine, PROM, Left Short Arc Quad: AROM, Right, 10 reps, Supine, PROM, Left Heel Slides: AROM, Right, 10 reps, Supine, PROM, Left    General Comments General comments (skin integrity, edema, etc.): on 10L O2, spO2 87-95% throughout session      Pertinent Vitals/Pain Pain Assessment Pain Assessment: Faces Faces Pain  Scale: Hurts a little bit    Home Living                          Prior Function            PT Goals (current goals can now be found in the care plan section) Acute Rehab PT Goals Patient Stated Goal: unable to state PT Goal Formulation: Patient unable to participate in goal setting    Frequency    Min 2X/week      PT Plan Current plan remains appropriate    Co-evaluation PT/OT/SLP Co-Evaluation/Treatment: Yes Reason for Co-Treatment: Complexity of the  patient's impairments (multi-system involvement);Necessary to address cognition/behavior during functional activity;For patient/therapist safety;To address functional/ADL transfers PT goals addressed during session: Mobility/safety with mobility;Balance;Strengthening/ROM OT goals addressed during session: ADL's and self-care      AM-PAC PT "6 Clicks" Mobility   Outcome Measure  Help needed turning from your back to your side while in a flat bed without using bedrails?: Total Help needed moving from lying on your back to sitting on the side of a flat bed without using bedrails?: Total Help needed moving to and from a bed to a chair (including a wheelchair)?: Total Help needed standing up from a chair using your arms (e.g., wheelchair or bedside chair)?: Total Help needed to walk in hospital room?: Total Help needed climbing 3-5 steps with a railing? : Total 6 Click Score: 6    End of Session Equipment Utilized During Treatment: Oxygen Activity Tolerance: Patient limited by lethargy Patient left: in bed;with call bell/phone within reach;with bed alarm set Nurse Communication: Mobility status PT Visit Diagnosis: Unsteadiness on feet (R26.81);Muscle weakness (generalized) (M62.81);Difficulty in walking, not elsewhere classified (R26.2);Other abnormalities of gait and mobility (R26.89);Other symptoms and signs involving the nervous system (R29.898);Hemiplegia and hemiparesis Hemiplegia - Right/Left: Left Hemiplegia - caused by: Cerebral infarction     Time: 1610-9604 PT Time Calculation (min) (ACUTE ONLY): 25 min  Charges:    $Therapeutic Activity: 8-22 mins PT General Charges $$ ACUTE PT VISIT: 1 Visit                    Lala Lund, PT, SPT  11:55 AM,03/01/23

## 2023-03-01 NOTE — Progress Notes (Signed)
   03/01/23 1000  Spiritual Encounters  Type of Visit Initial;Attempt (pt unavailable)  Referral source Physician  Reason for visit Routine spiritual support  OnCall Visit Yes   Chaplain attempted to visit patient. Patient asleep.

## 2023-03-01 NOTE — Plan of Care (Signed)
Discussed with Dr. Lynne Logan -- Neurology will be available as needed going forward, plan as previously outlined, goals of care discussions ongoing.   Brooke Dare MD-PhD [facility] [phone]  [facility] coverage for [facility] is from 8 AM to 4 AM in-house and 4 PM to 8 PM by telephone/video. 8 PM to 8 AM emergent questions or overnight urgent questions should be addressed to Teleneurology On-call or [facility] neurohospitalist; contact information can be found on AMION  No charge note

## 2023-03-01 NOTE — Progress Notes (Signed)
ANTICOAGULATION CONSULT NOTE  Pharmacy Consult for Heparin Indication: pulmonary embolus and stroke  Allergies  Allergen Reactions   Chantix [Varenicline] Swelling        Lipitor [Atorvastatin] Swelling   Mobic [Meloxicam] Other (See Comments)    Myalgia   Statins Other (See Comments)    Myalgia    Patient Measurements: Height: 5\' 9"  (175.3 cm) Weight: 71.9 kg (158 lb 9.6 oz) IBW/kg (Calculated) : 70.7 Heparin Dosing Weight: 71.9 kg  Vital Signs: Temp: 97.7 F (36.5 C) (07/01 2300) Temp Source: Oral (07/02 0400) BP: 129/74 (07/02 0400) Pulse Rate: 88 (07/02 0400)  Labs: Recent Labs    02/27/23 0458 02/28/23 0730 03/01/23 0705  HGB 10.9* 11.2*  --   HCT 34.6* 35.7*  --   PLT 145* 206  --   APTT 75* 67*  --   HEPARINUNFRC 0.42 0.48 0.61  CREATININE 0.81  --   --     Estimated Creatinine Clearance: 89.7 mL/min (by C-G formula based on SCr of 0.81 mg/dL).  Medical History: Past Medical History:  Diagnosis Date   Allergy    Anxiety    Arthritis    Barrett's esophagus    Bronchitis, chronic (HCC)    Cancer (HCC)    LUNG   COPD (chronic obstructive pulmonary disease) (HCC)    Cough    CHRONIC   Depression    Dizziness    DVT (deep venous thrombosis) (HCC)    Dysphagia    Essential hypertension 06/28/2017   GERD (gastroesophageal reflux disease)    H/O emphysema (HCC)    Headache    History of depression 06/05/2014   Last Assessment & Plan:  Mood is doing well on medications.    History of hiatal hernia    Hoarseness of voice    partial paralyzed vocal cord   HOH (hard of hearing)    Hypertension    Lung mass    Maxillary fracture (HCC)    LEFT SIDE   Orthopnea    Oxygen decrease    H/O HOME USE, NOT NOW   Personal history of colonic polyps    Pneumonia    PSEUDOMONAL PNEUMONIA IN THE PAST   PVD (peripheral vascular disease) (HCC) 06/05/2014   Overview:  Smoker with poor pulses  Last Assessment & Plan:  No leg pain is noted of late.    Renal  failure    PRERENAL   Seizures (HCC)    sounds like it could be vagal at times, coughing can cause him to fade out and see white lights. had last sz 3 months ago when not taking meds   Shortness of breath dyspnea    uses many different inhalers   Squamous cell carcinoma of skin 01/07/2020   right mid volar forearm, mid back spinal   Varicose veins of both lower extremities with pain 06/28/2017   Wheezing    Assessment: Steve Gregory is a 67 y.o. male presenting with acute onset of left-sided weakness with expressive dysphasia of questionable onset. PMH significant for anxiety, depression, GERD, HTN, emphysema, PVD, Barrett's esophagus, anxiety, OA. Patient was on apixaban 5 mg BID PTA with last dose thought to be earlier this week. He was recently diagnosed with left lower extremity DVT and placed on Eliquis. 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 He was on anticoagulation at home. Per neurology, given the large size of the stroke,  anticoagulation puts him at risk of hemorrhagic conversion but if that has to be used for PE, and recommended using heparin drip stroke protocol with lower HL/aPTT goals. Pharmacy has been consulted to initiate and manage heparin infusion.   Baseline Labs: aPTT 41, PT 18.4, INR 1.5, Hgb 9.9, Hct 31.6, Plt 122   Goal of Therapy:  Heparin level 03.-0.5 units/ml aPTT 66-85 seconds Monitor platelets by anticoagulation protocol: Yes   Date Time aPTT/HL Rate/Comment  6/28 2001 48/0.64 1200/aPTT subtherapeutic 6/29 0104 71/0.67 1350/aPTT therapeutic x 1 6/30 0458 75/0.42 1350/aPTT therapeutic x 2 07/01 0730 67/0.48  Therapeutic, Continue 1350 un/hr 07/02 0705 --/ 0.61  Therapeutic  Plan:  No boluses per MD Continue heparin infusion at 1350 units/hr aPTT and HL correlating. Will continue to monitor HL Hgb and plt stable Continue to monitor H&H and  platelets daily while on heparin infusion    Gustave Lindeman Rodriguez-Guzman PharmD, BCPS 03/01/2023 7:54 AM

## 2023-03-01 NOTE — Progress Notes (Addendum)
Daily Progress Note   Patient Name: Steve Gregory       Date: 03/01/2023 DOB: Feb 28, 1956  Age: 67 y.o. MRN#: [id number] Attending Physician: Marcelino Duster, MD Primary Care Physician: Lauro Regulus, MD Admit Date: 02/03/2023  Reason for Consultation/Follow-up: Establishing goals of care  Subjective: Notes reviewed; as per notes attending spoke with family and patient has morphine as needed for severe pain and Ativan as needed for anxiety.  Robinul is in place for excessive secretions.   Patient is resting in bed with work of breathing noted.  He opened his eyes and attempted to speak to me, but I was unable to understand him.  No family at bedside.    Spoke in depth with primary team; primary team is working closely with family.  PMT will shadow.   Length of Stay: 5  Current Medications: Scheduled Meds:   losartan  50 mg Oral Daily   methylPREDNISolone (SOLU-MEDROL) injection  40 mg Intravenous Q12H   mirtazapine  7.5 mg Oral QHS   montelukast  10 mg Oral Daily   pantoprazole (PROTONIX) IV  40 mg Intravenous BID WC   theophylline  400 mg Oral Daily    Continuous Infusions:  ampicillin-sulbactam (UNASYN) IV Stopped (03/01/23 0545)   dextrose 5% lactated ringers 75 mL/hr at 03/01/23 1026   heparin 1,350 Units/hr (03/01/23 0000)    PRN Meds: acetaminophen **OR** acetaminophen (TYLENOL) oral liquid 160 mg/5 mL **OR** acetaminophen, albuterol, fluticasone, glycopyrrolate, LORazepam, magnesium hydroxide, morphine injection, ondansetron (ZOFRAN) IV, senna-docusate, traZODone  Physical Exam Constitutional:      Comments: Opens eyes spontaneously  Pulmonary:     Comments: Work of breathing noted            Vital Signs: BP (!) 143/84   Pulse (!) 104   Temp 97.8 F  (36.6 C) (Oral)   Resp (!) 23   Ht 5\' 9"  (1.753 m)   Wt 71.9 kg   SpO2 92%   BMI 23.42 kg/m  SpO2: SpO2: 92 % O2 Device: O2 Device: High Flow Nasal Cannula O2 Flow Rate: O2 Flow Rate (L/min): 10 L/min  Intake/output summary:  Intake/Output Summary (Last 24 hours) at 03/01/2023 1232 Last data filed at 03/01/2023 0600 Gross per 24 hour  Intake 3396.17 ml  Output --  Net 3396.17 ml   LBM:  Baseline Weight: Weight: 71.9 kg Most recent weight: Weight: 71.9 kg   Patient Active Problem List   Diagnosis Date Noted   Acute hypoxic respiratory failure (HCC) 02/28/2023   Bacteremia 02/28/2023   Acute pulmonary embolism (HCC) 02/25/2023   Right middle lobe pneumonia 02/25/2023   Acute cerebral infarction (HCC) 02/13/2023   Chronic obstructive pulmonary disease (COPD) (HCC) 02/23/2023   GERD without esophagitis 02/18/2023   History of deep venous thrombosis (DVT) of distal vein of left lower extremity 02/08/2023   Peripheral neuropathy 02/07/2023   Sepsis due to undetermined organism (HCC) 01/30/2023   COPD exacerbation (HCC) 11/12/2019   Myalgia due to statin 07/12/2019   Goals of care, counseling/discussion 08/21/2018   Neurogenic pain 07/10/2018   Chronic pain syndrome 07/10/2018   Vitamin D insufficiency 04/18/2018   Pharmacologic therapy 04/11/2018   Disorder of skeletal system 04/11/2018   Problems influencing health status 04/11/2018   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   Lumbosacral radiculopathy at S1 01/03/2018   Chronic bilateral low back pain with bilateral sciatica 01/03/2018   Lumbar degenerative disc disease 01/03/2018   Cervicalgia 01/03/2018   Lung mass 07/11/2017   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) 07/11/2017   Lower extremity pain, bilateral 06/28/2017   Varicose veins of both lower extremities with pain 06/28/2017   Tobacco dependence 06/28/2017   Hyperglycemia 03/07/2017   Prediabetes 03/07/2017    Healthcare maintenance 11/04/2016   History of depression 06/05/2014   PVD (peripheral vascular disease) (HCC) 06/05/2014   HTN (hypertension), benign 02/22/2014   GERD (gastroesophageal reflux disease) 02/22/2014   COPD (chronic obstructive pulmonary disease) with emphysema (HCC) 02/21/2014    Palliative Care Assessment & Plan     Recommendations/Plan: Patient's brother yesterday was clear that the family wanted a few more days to make decisions.   PMT will shadow  Code Status:    Code Status Orders  (From admission, onward)           Start     Ordered   02/27/23 0635  Do not attempt resuscitation (DNR)  Continuous       Question Answer Comment  If patient has no pulse and is not breathing Do Not Attempt Resuscitation   If patient has a pulse and/or is breathing: Medical Treatment Goals LIMITED ADDITIONAL INTERVENTIONS: Use medication/IV fluids and cardiac monitoring as indicated; Do not use intubation or mechanical ventilation (DNI), also provide comfort medications.  Transfer to Progressive/Stepdown as indicated, avoid Intensive Care.   Consent: Discussion documented in EHR or advanced directives reviewed      02/27/23 0634           Code Status History     Date Active Date Inactive Code Status Order ID Comments User Context   01/29/2023 2023 02/27/2023 0634 Full Code [id number]  [facility] Vernetta Honey, MD ED   11/12/2019 2031 11/13/2019 1506 Full Code [id number]  Mansy, Vernetta Honey, MD ED       Prognosis: Poor    Thank you for allowing the Palliative Medicine Team to assist in the care of this patient.    Morton Stall, NP  Please contact Palliative Medicine Team phone at [phone] for questions and concerns.

## 2023-03-01 NOTE — Progress Notes (Signed)
Progress Note   Patient: Steve Gregory:[id number] DOB: 12-10-1955 DOA: 02/07/2023     5 DOS: the patient was seen and examined on 03/01/2023   Brief hospital course: Steve Gregory is a 67 y.o. Caucasian male with medical history significant for anxiety, depression, GERD, essential hypertension, emphysema, peripheral vascular disease, Barrett's esophagus, anxiety and osteoarthritis, who presented to the emergency room with acute onset of left-sided weakness with expressive dysphasia of questionable onset.  The patient's brother stated that on Tuesday he told him he is eyes were crossed and he had double vision.  He was recently diagnosed with left lower extremity DVT and placed on Eliquis.    Noncontrasted head CT scan showed large right MCA territory acute infarction with no hemorrhage or mass effect. 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 Focal filling defects involving the anterior communicating artery complex and proximal right A2 segment, likely subocclusive thrombus. Large evolving right MCA/ACA distribution infarct. MRI brain confirmed the findings. CTA chest showed - segmental and subsegmental PE to the right lower lobe, increasing mediastinal lymphadenopathy and right hilar nodal stations concerning for potential metastatic disease, increasing areas of airspace consolidation right lung most severe in right middle and lower lobes concerning for pneumonia, diffuse bronchial wall thickening, aortic atherosclerosis and calcifications of attic valve.  Patient has severe respiratory distress requiring 6-10L HFNC oxygen. Unable to swallow, unable to clear secretions, high risk for aspiration. I discussed poor prognosis with family. Brother agreed to give only small doses of opiates for comfort but wants to continue current care for 2 more days. I did say that he may not improve, but brother is willing to take a decision at that time.  Assessment and  Plan: * Acute cerebral infarction [name]) Acute MCA/ACA territory infarction with subsequent left-sided hemiplegia. Continue neurochecks as per protocol.  His mental status is poor. Continue telemetry. Continue aspirin, statin therapy. Neurology follow up appreciated. Overall prognosis poor. Discussed with neurologist. No DOAC at this time. Unable to participate in PT/ OT. N.p.o. for now due to aspiration risk. Hold oral medications. Palliative team discussed further with family, he is DNR.  Sepsis due to undetermined organism (HCC) Right middle and lower lobe pneumonia  Blood cultures positive for Staph, bacilllus CTA chest reveals PE, right sided pneumonia.  Due to aspiration. Secretions worsening, tachypnea noted. Rocephin changed to unasyn therapy.  Continue frequent suctioning. Aspiration precautions. Repeat blood cultures so far negative. Continue with gentle IV fluids as he is NPO.  Acute hypoxic respiratory failure In the setting of aspiration pneumonia, PE, stroke. He is now requiring 10L high flow oxygen. Continue Unasyn therapy. Continue bronchodilators, pulmonary toilet. Monitor vitals closely. Supplemental oxygen as need to maintain saturation above 92%.  Right-sided pulmonary embolism History of deep venous thrombosis (DVT) of distal vein of left lower extremity. Continue heparin drip per pharmacy protocol. Continue to monitor APTT. No transition to oral anticoagulation at this time due to large stroke, hemorrhagic conversion.  GERD without esophagitis Continue IV PPI.  History of lung cancer status post SBRT Chronic obstructive pulmonary disease (COPD) (HCC) Continue DuoNebs, home inhalers. CTA chest revealed worsening lymphadenopathy potential metastatic disease.  Oncology recommended PET/CT once he is more stable.His overall prognosis is poor, family understands and agree with DNR, wishes to continue current care.  DVT prophylaxis-on heparin drip. Fall, seizure  and aspiration precautions. CODE STATUS DNR.   Palliative team on board for goals of care discussion. Brother would like  to continue current treatment for 2 more days, agreed for morphine, ativan as needed for comfort.      Subjective: Patient is seen and examined today morning. He is sleeping, has moderate respiratory distress. Patient is on 10L HFNC to maintain saturation above 90%. Brother at bedside, explained current clinical condition, prognosis.  Physical Exam: Vitals:   03/01/23 0859 03/01/23 1000 03/01/23 1100 03/01/23 1254  BP: 132/71 (!) 143/84  132/72  Pulse: 99 (!) 104 91 90  Resp: (!) 23 (!) 23 18 16   Temp: 97.8 F (36.6 C)   (!) 97.5 F (36.4 C)  TempSrc: Oral   Oral  SpO2: 94% 92% 93% 94%  Weight:      Height:       General - Elderly Caucasian male, lethargic obtunded. Moderate respiratory distress HEENT - PERRLA, EOMI, atraumatic head, non tender sinuses. Lung - Clear, diffuse rhonchi, wheezes, tachypnea, using accessory muscles Heart - S1, S2 heard, no murmurs, rubs, trace pedal edema Neuro -sleepy, lethargic, unable to do full neuro exam. Skin - Warm and dry. Data Reviewed:  CBC, aptt, blood sugars, blood/urine cultures  Family Communication: Discussed with brother, wants patient comfortable but also want to continue medical treatment for 2 more days. Agrees to give morphine, ativan as needed.   Disposition: Status is: Inpatient Remains inpatient appropriate because: large stroke with midline shift  Planned Discharge Destination:  unsure at this time , prognosis poor    MDM level 3- patient has h/o lung cancer, DVT with large stroke, multiple PE, respiratory failure. Poor prognosis. He is at high risk for sudden clinical deterioration.   Author: Marcelino Duster, MD 03/01/2023 4:00 PM  For on call review [email or web address].

## 2023-03-01 NOTE — Progress Notes (Signed)
SLP Cancellation Note  Patient Details Name: Steve Gregory MRN: [id number] DOB: 1955/12/27   Cancelled treatment:       Reason Eval/Treat Not Completed: Patient not medically ready;Medical issues which prohibited therapy (chart reviewed)   Per Neurology note, pt continues to "require deep suctioning due to increased secretions. Wants something to drink.".  Oral intake is NOT recommended at this time d/t PROFOUND risk for aspiration and further pulmonary decline. Recommendations for frequent oral care for hygiene and stimulation of swallowing and comfort have been given to NSG, in chart.  Noted (per Neurology note) pt has had a "Large acute ischemic stroke involving the right ACA and right MCA territory-likely emboli from hypercoagulability versus cardioembolic source. outcomes from a neurological meaningful recovery are going to be extremely poor and they should consider comfort measures. Palliative Care team has also been consulted.". ST services will continue to monitor pt's status and provide oral stimulation via swabs, education.       Jerilynn Som, MS, CCC-SLP Speech Language Pathologist Rehab Services; [facility] [phone] (ascom) Steve Gregory 03/01/2023, 1:58 PM

## 2023-03-01 NOTE — Progress Notes (Signed)
Occupational Therapy Treatment Patient Details Name: Steve Gregory MRN: [id number] DOB: 1955-12-04 Today's Date: 03/01/2023   History of present illness 67 y/o male presented to ED on 02/21/2023 after being found unresponsive in home. Found to have incomprehensible speech and R sided gaze. MRI found large evolving R ACA and MCA infarcts. PMH: anxiety, depression, HTN, emphysema, PVD, Barrett's esophagus   OT comments  [name] was seen for OT/PT co-treatment on this date. Upon arrival to room pt reclined in bed, RN agreeable to tx. Pt requires MAX A x2 sup>sit, TOTAL A x2 sit>sup. SpO2 90-92% on 10L HFNC seated EOB, desat 88% after 5 min static sitting, resolved to 92% in supine with increased time. MIN A tooth brushing with suction toothbrush at bed level, assist to locate mouth as pt attempts to brush L side of face. Pt making progress toward goals, will continue to follow POC. Discharge recommendation remains appropriate.     Recommendations for follow up therapy are one component of a multi-disciplinary discharge planning process, led by the attending physician.  Recommendations may be updated based on patient status, additional functional criteria and insurance authorization.    Assistance Recommended at Discharge Frequent or constant Supervision/Assistance  Patient can return home with the following  Two people to help with walking and/or transfers;Two people to help with bathing/dressing/bathroom;Help with stairs or ramp for entrance   Equipment Recommendations  Other (comment) (defer)    Recommendations for Other Services      Precautions / Restrictions Precautions Precautions: Fall Precaution Comments: L hemi, pushes to L Restrictions Weight Bearing Restrictions: No       Mobility Bed Mobility Overal bed mobility: Needs Assistance Bed Mobility: Supine to Sit, Sit to Supine     Supine to sit: Max assist, +2 for physical assistance Sit to supine: Total assist, +2 for physical  assistance        Transfers                   General transfer comment: unsafe to attempt     Balance Overall balance assessment: Needs assistance Sitting-balance support: Single extremity supported, Feet supported Sitting balance-Leahy Scale: Poor                                     ADL either performed or assessed with clinical judgement   ADL Overall ADL's : Needs assistance/impaired                                       General ADL Comments: MIN A tooth brushing at bed level.       Cognition Arousal/Alertness: Awake/alert Behavior During Therapy: Flat affect Overall Cognitive Status: Impaired/Different from baseline Area of Impairment: Attention, Memory, Following commands, Awareness                   Current Attention Level: Sustained Memory: Decreased recall of precautions, Decreased short-term memory Following Commands: Follows one step commands consistently                Exercises      Shoulder Instructions       General Comments  SpO2 90-92% on 10L HFNC seated EOB, desat 88% after 5 min static sitting, resolved to 92% in supine with increased time     Pertinent Vitals/ Pain  Pain Assessment Pain Assessment: PAINAD Breathing: occasional labored breathing, short period of hyperventilation Negative Vocalization: none Facial Expression: smiling or inexpressive Body Language: relaxed Consolability: no need to console PAINAD Score: 1   Frequency  Min 1X/week        Progress Toward Goals  OT Goals(current goals can now be found in the care plan section)  Progress towards OT goals: Progressing toward goals  Acute Rehab OT Goals Patient Stated Goal: unable to state OT Goal Formulation: Patient unable to participate in goal setting Time For Goal Achievement: 03/11/23 Potential to Achieve Goals: Fair ADL Goals Pt Will Perform Grooming: with min assist;bed level Pt Will Perform Upper  Body Dressing: with mod assist;sitting Pt Will Transfer to Toilet: with max assist;squat pivot transfer;bedside commode Pt/caregiver will Perform Home Exercise Program: Increased strength;Left upper extremity;With minimal assist  Plan      Co-evaluation    PT/OT/SLP Co-Evaluation/Treatment: Yes Reason for Co-Treatment: Complexity of the patient's impairments (multi-system involvement);Necessary to address cognition/behavior during functional activity;For patient/therapist safety;To address functional/ADL transfers PT goals addressed during session: Mobility/safety with mobility;Balance;Strengthening/ROM OT goals addressed during session: ADL's and self-care      AM-PAC OT "6 Clicks" Daily Activity     Outcome Measure   Help from another person eating meals?: A Lot Help from another person taking care of personal grooming?: A Lot Help from another person toileting, which includes using toliet, bedpan, or urinal?: Total Help from another person bathing (including washing, rinsing, drying)?: A Lot Help from another person to put on and taking off regular upper body clothing?: A Lot Help from another person to put on and taking off regular lower body clothing?: Total 6 Click Score: 10    End of Session Equipment Utilized During Treatment: Oxygen  OT Visit Diagnosis: Other abnormalities of gait and mobility (R26.89);Muscle weakness (generalized) (M62.81);Hemiplegia and hemiparesis Hemiplegia - Right/Left: Left Hemiplegia - dominant/non-dominant: Non-Dominant Hemiplegia - caused by: Cerebral infarction   Activity Tolerance Patient limited by fatigue   Patient Left in bed;with call bell/phone within reach;with nursing/sitter in room   Nurse Communication Mobility status        Time: 1610-9604 OT Time Calculation (min): 25 min  Charges: OT General Charges $OT Visit: 1 Visit OT Treatments $Self Care/Home Management : 8-22 mins  Kathie Dike, M.S. OTR/L  03/01/23, 12:15 PM   ascom [phone]

## 2023-03-02 DIAGNOSIS — I639 Cerebral infarction, unspecified: Secondary | ICD-10-CM | POA: Diagnosis not present

## 2023-03-02 LAB — GLUCOSE, CAPILLARY
Glucose-Capillary: 168 mg/dL — ABNORMAL HIGH (ref 70–99)
Glucose-Capillary: 143 mg/dL — ABNORMAL HIGH (ref 70–99)
Glucose-Capillary: 152 mg/dL — ABNORMAL HIGH (ref 70–99)
Glucose-Capillary: 166 mg/dL — ABNORMAL HIGH (ref 70–99)
Glucose-Capillary: 174 mg/dL — ABNORMAL HIGH (ref 70–99)
Glucose-Capillary: 176 mg/dL — ABNORMAL HIGH (ref 70–99)

## 2023-03-02 LAB — CULTURE, BLOOD (ROUTINE X 2)
Culture: NO GROWTH
Special Requests: ADEQUATE

## 2023-03-02 LAB — HEPARIN LEVEL (UNFRACTIONATED): Heparin Unfractionated: 0.48 IU/mL (ref 0.30–0.70)

## 2023-03-02 NOTE — Progress Notes (Signed)
PROGRESS NOTE    Steve Gregory  UJW:[id number] DOB: [birth year] DOA: 02/06/2023 PCP: Lauro Regulus, MD  124A/124A-AA  LOS: 6 days   Brief hospital course:   Assessment & Plan: Steve Gregory is a 67 y.o. Caucasian male with medical history significant for anxiety, depression, GERD, essential hypertension, emphysema, peripheral vascular disease, Barrett's esophagus, anxiety and osteoarthritis, who presented to the emergency room with acute onset of left-sided weakness with expressive dysphasia of questionable onset.  The patient's brother stated that on Tuesday he told him he is eyes were crossed and he had double vision.  He was recently diagnosed with left lower extremity DVT and placed on Eliquis.    Noncontrasted head CT scan showed large right MCA territory acute infarction with no hemorrhage or mass effect. 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 Focal filling defects involving the anterior communicating artery complex and proximal right A2 segment, likely subocclusive thrombus. Large evolving right MCA/ACA distribution infarct. MRI brain confirmed the findings. CTA chest showed - segmental and subsegmental PE to the right lower lobe, increasing mediastinal lymphadenopathy and right hilar nodal stations concerning for potential metastatic disease, increasing areas of airspace consolidation right lung most severe in right middle and lower lobes concerning for pneumonia, diffuse bronchial wall thickening, aortic atherosclerosis and calcifications of attic valve.   Patient has severe respiratory distress requiring 6-10L HFNC oxygen. Unable to swallow, unable to clear secretions, high risk for aspiration. I discussed poor prognosis with family. Brother agreed to give only small doses of opiates for comfort but wants to continue current care for 2 more days. I did say that he may not improve, but brother is willing to take a decision at that  time.   * Acute cerebral infarction (HCC) Acute MCA/ACA territory infarction with subsequent left-sided hemiplegia. His mental status is poor.  Currently not safe for oral intake.  Overall prognosis poor. Discussed with neurologist. No DOAC at this time. Unable to participate in PT/ OT. NPO for now due to aspiration risk.  Hold oral medications.  Palliative team discussed further with family, he is DNR.   Sepsis due to  Right middle and lower lobe Aspiration pneumonia  CTA chest reveals PE, right sided pneumonia.  Due to aspiration. Secretions worsening, tachypnea noted. Rocephin changed to unasyn therapy.  Continue frequent suctioning. Aspiration precautions. --cont Unasyn  1 Blood culture positive for Staph, bacillus likely contaminant    Acute hypoxic respiratory failure In the setting of aspiration pneumonia, PE, stroke. --on 10L high flow oxygen. Continue Unasyn therapy. Continue bronchodilators, pulmonary toilet.   Right-sided pulmonary embolism Hx of DVT No transition to oral anticoagulation at this time due to large stroke, and risk of hemorrhagic conversion. --cont heparin gtt   GERD without esophagitis --d/c IV PPI   History of lung cancer status post SBRT Chronic obstructive pulmonary disease (COPD) (HCC) CTA chest revealed worsening lymphadenopathy potential metastatic disease.  Oncology recommended PET/CT once he is more stable.   DVT prophylaxis: FA:OZHYQMV gtt Code Status: DNR  Family Communication: brother and mother updated at bedside today Level of care: Med-Surg Dispo:   The patient is from: home Anticipated d/c is to: undetermined Anticipated d/c date is: undetermined   Subjective and Interval History:  Pt obtunded during rounding.   Objective: Vitals:   03/02/23 0418 03/02/23 0825 03/02/23 1644 03/02/23 1936  BP: (!) 158/76 (!) 151/85 (!) 149/87 (!) 162/69  Pulse: 97 (!) 110 (!) 109 73  Resp: 20 20 (!) 24 (!) 30  Temp: 98.2 F (36.8 C)  99 F (37.2 C) 98.1 F (36.7 C) 98.5 F (36.9 C)  TempSrc:  Oral Oral   SpO2: 95% 92% (!) 88% (!) 81%  Weight:      Height:        Intake/Output Summary (Last 24 hours) at 03/02/2023 1944 Last data filed at 03/02/2023 1108 Gross per 24 hour  Intake 1685.7 ml  Output --  Net 1685.7 ml   Filed Weights   02/20/2023 1823  Weight: 71.9 kg    Examination:   Constitutional: obtunded CV: No cyanosis.   RESP: normal respiratory effort, on HFNC SKIN: warm, dry   Data Reviewed: I have personally reviewed labs and imaging studies  Time spent: 50 minutes  Darlin Priestly, MD [facility] If 7PM-7AM, please contact night-coverage 03/02/2023, 7:44 PM

## 2023-03-02 NOTE — TOC Progression Note (Addendum)
Transition of Care [facility]) - Progression Note    Patient Details  Name: Steve Gregory MRN: [id number] Date of Birth: 04-16-1956  Transition of Care [facility]) CM/SW Contact  Garret Reddish, RN Phone Number: 03/02/2023, 9:33 AM  Clinical Narrative:   Chart reviewed. Patient was admitted with CVA.  Noted that patient that patient also being treated for  Right middle and lower lobe pneumonia  Blood cultures positive for Staph, bacillus.  Patient is on IV Usnyn.  Patient also being treated for Acute hypoxic respiratory failure.  Patient is currently requiring 10L High flow oxygen. Patient is NPO due to Aspiration precautions.  Palliative Team will shadow peripherally.    TOC will continue to follow for discharge planning.          Expected Discharge Plan and Services                                               Social Determinants of Health (SDOH) Interventions SDOH Screenings   Tobacco Use: High Risk (02/25/2023)    Readmission Risk Interventions     No data to display

## 2023-03-03 DIAGNOSIS — Z7189 Other specified counseling: Secondary | ICD-10-CM | POA: Diagnosis not present

## 2023-03-03 DIAGNOSIS — I639 Cerebral infarction, unspecified: Secondary | ICD-10-CM | POA: Diagnosis not present

## 2023-03-03 DIAGNOSIS — I2699 Other pulmonary embolism without acute cor pulmonale: Secondary | ICD-10-CM | POA: Diagnosis not present

## 2023-03-03 DIAGNOSIS — J9601 Acute respiratory failure with hypoxia: Secondary | ICD-10-CM | POA: Diagnosis not present

## 2023-03-03 DIAGNOSIS — A419 Sepsis, unspecified organism: Secondary | ICD-10-CM | POA: Diagnosis not present

## 2023-03-03 LAB — BASIC METABOLIC PANEL
Anion gap: 10 (ref 5–15)
BUN: 60 mg/dL — ABNORMAL HIGH (ref 8–23)
CO2: 26 mmol/L (ref 22–32)
Calcium: 8.4 mg/dL — ABNORMAL LOW (ref 8.9–10.3)
Chloride: 114 mmol/L — ABNORMAL HIGH (ref 98–111)
Creatinine, Ser: 1.38 mg/dL — ABNORMAL HIGH (ref 0.61–1.24)
GFR, Estimated: 56 mL/min — ABNORMAL LOW (ref >60)
Glucose, Bld: 192 mg/dL — ABNORMAL HIGH (ref 70–99)
Potassium: 4.4 mmol/L (ref 3.5–5.1)
Sodium: 150 mmol/L — ABNORMAL HIGH (ref 135–145)

## 2023-03-03 LAB — CBC
HCT: 34.8 % — ABNORMAL LOW (ref 39.0–52.0)
Hemoglobin: 10.4 g/dL — ABNORMAL LOW (ref 13.0–17.0)
MCH: 28.6 pg (ref 26.0–34.0)
MCHC: 29.9 g/dL — ABNORMAL LOW (ref 30.0–36.0)
MCV: 95.6 fL (ref 80.0–100.0)
Platelets: 252 10*3/uL (ref 150–400)
RBC: 3.64 MIL/uL — ABNORMAL LOW (ref 4.22–5.81)
RDW: 14.4 % (ref 11.5–15.5)
WBC: 21.1 10*3/uL — ABNORMAL HIGH (ref 4.0–10.5)
nRBC: 1.3 % — ABNORMAL HIGH (ref 0.0–0.2)

## 2023-03-03 LAB — HEPARIN LEVEL (UNFRACTIONATED): Heparin Unfractionated: 0.52 IU/mL (ref 0.30–0.70)

## 2023-03-03 LAB — GLUCOSE, CAPILLARY
Glucose-Capillary: 181 mg/dL — ABNORMAL HIGH (ref 70–99)
Glucose-Capillary: 183 mg/dL — ABNORMAL HIGH (ref 70–99)
Glucose-Capillary: 184 mg/dL — ABNORMAL HIGH (ref 70–99)

## 2023-03-03 LAB — MAGNESIUM: Magnesium: 2.7 mg/dL — ABNORMAL HIGH (ref 1.7–2.4)

## 2023-03-03 MED ORDER — MORPHINE SULFATE (PF) 2 MG/ML IV SOLN
2.0000 mg | INTRAVENOUS | Status: DC | PRN
  Administered 2023-03-03 (×2): 2 mg via INTRAVENOUS
  Filled 2023-03-03 (×2): qty 1

## 2023-03-03 MED ORDER — SODIUM CHLORIDE 0.9 % IV SOLN
INTRAVENOUS | Status: DC
Start: 2023-03-03 — End: 2023-03-03

## 2023-03-03 MED ORDER — LORAZEPAM 2 MG/ML IJ SOLN: 1.0000 mg | INTRAMUSCULAR | Status: DC | PRN

## 2023-03-03 MED ORDER — GLYCOPYRROLATE 0.2 MG/ML IJ SOLN
0.4000 mg | Freq: Once | INTRAMUSCULAR | Status: AC
  Administered 2023-03-03: 0.4 mg via INTRAVENOUS
  Filled 2023-03-03: qty 2

## 2023-03-03 MED ORDER — POLYVINYL ALCOHOL 1.4 % OP SOLN: 1.0000 [drp] | Freq: Four times a day (QID) | OPHTHALMIC | Status: DC | PRN

## 2023-03-16 ENCOUNTER — Other Ambulatory Visit: Payer: Medicare Other

## 2023-03-21 ENCOUNTER — Ambulatory Visit: Payer: Medicare Other | Admitting: Radiation Oncology

## 2023-03-31 NOTE — Progress Notes (Signed)
Made Manuela Schwartz NP aware of patient status at beginning of shift. Reviewed patient status, current vitals, rx given, and family requests as per progress notes. (He is currently on 15 lpm hi flow. He was given robinol, ativan, & neb at 1942. His current vitals are 145/70, p 110, r 25, 02 91% 98.7.) Continued to update NP throughout night. Orders given as needed. Np at pt bedside several times during night. Respiratory contacted for assessment & needs x2 this am. Charge RN & ac were notified. See flow sheet for further information.

## 2023-03-31 NOTE — Progress Notes (Addendum)
Daily Progress Note   Patient Name: Steve Gregory       Date: 03/23/2023 DOB: Aug 21, 1956  Age: 67 y.o. MRN#: [id number] Attending Physician: Darlin Priestly, MD Primary Care Physician: Lauro Regulus, MD Admit Date: 02/23/2023  Reason for Consultation/Follow-up: Establishing goals of care  Subjective: Epic chat in place regarding patient's status, suffering, and now on NRB. Discussion via epic chat of my previous interaction with brother and staff on the unit.  Brother appeared angry during previous conversation in trying to discuss respiratory status, and stated very clearly to me that a plan of care was already in place which would not change regardless of status, and he did not want to discuss further.  Notes and labs reviewed.  In to see patient. He is now on NRB and has received a small dose of Morphine; mild WOB is noted at this time. Rapid response nurse is present and speaking with charge nurse for 1C.  Discussed previous conversations and decisions.  Plan currently  in place to move patient to PCU. Patient was in PCU on 7/2 at the time of my visit. Spoke with PCU charge RN to update.  Attending with plans to speak with family.  ADDENDUM: Updated by Dr. Sherryll Burger that he is now attending for this patient.  Later received an Epic chat that he has been able to speak with family, and the patient has been shifted to comfort care and is in the dying process.  Comfort orders placed by attending team.  Family working directly with attending team and as per epic chat PMT will sign off at this time.  Length of Stay: 7  Current Medications: Scheduled Meds:   losartan  50 mg Oral Daily   methylPREDNISolone (SOLU-MEDROL) injection  40 mg Intravenous Q12H   mirtazapine  7.5 mg Oral QHS   montelukast   10 mg Oral Daily   theophylline  400 mg Oral Daily    Continuous Infusions:  ampicillin-sulbactam (UNASYN) IV 3 g (03/12/2023 0514)   dextrose 5% lactated ringers 1,000 mL (03/07/2023 0717)   heparin 1,350 Units/hr (03/27/2023 0344)    PRN Meds: acetaminophen **OR** acetaminophen (TYLENOL) oral liquid 160 mg/5 mL **OR** acetaminophen, albuterol, fluticasone, glycopyrrolate, LORazepam, magnesium hydroxide, morphine injection, ondansetron (ZOFRAN) IV, senna-docusate, traZODone  Physical Exam Constitutional:      Comments:  Eyes closed.   Pulmonary:     Comments: On NRB.             Vital Signs: BP (!) 147/84   Pulse (!) 110   Temp 98.9 F (37.2 C) (Oral)   Resp 16   Ht 5\' 9"  (1.753 m)   Wt 71.9 kg   SpO2 (!) 66%   BMI 23.42 kg/m  SpO2: SpO2: (!) 66 % O2 Device: O2 Device: NRB O2 Flow Rate: O2 Flow Rate (L/min): 15 L/min  Intake/output summary:  Intake/Output Summary (Last 24 hours) at 03/11/2023 0930 Last data filed at 03/24/2023 4098 Gross per 24 hour  Intake 1587.39 ml  Output --  Net 1587.39 ml   LBM: Last BM Date : 02/28/23 Baseline Weight: Weight: 71.9 kg Most recent weight: Weight: 71.9 kg         Patient Active Problem List   Diagnosis Date Noted   Acute hypoxic respiratory failure (HCC) 02/28/2023   Bacteremia 02/28/2023   Acute pulmonary embolism (HCC) 02/25/2023   Right middle lobe pneumonia 02/25/2023   Acute cerebral infarction (HCC) 02/16/2023   Chronic obstructive pulmonary disease (COPD) (HCC) 02/26/2023   GERD without esophagitis 02/16/2023   History of deep venous thrombosis (DVT) of distal vein of left lower extremity 02/03/2023   Peripheral neuropathy 02/17/2023   Sepsis due to undetermined organism (HCC) 02/14/2023   COPD exacerbation (HCC) 11/12/2019   Myalgia due to statin 07/12/2019   Goals of care, counseling/discussion 08/21/2018   Neurogenic pain 07/10/2018   Chronic pain syndrome 07/10/2018   Vitamin D insufficiency 04/18/2018    Pharmacologic therapy 04/11/2018   Disorder of skeletal system 04/11/2018   Problems influencing health status 04/11/2018   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   Lumbosacral radiculopathy at S1 01/03/2018   Chronic bilateral low back pain with bilateral sciatica 01/03/2018   Lumbar degenerative disc disease 01/03/2018   Cervicalgia 01/03/2018   Lung mass 07/11/2017   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) 07/11/2017   Lower extremity pain, bilateral 06/28/2017   Varicose veins of both lower extremities with pain 06/28/2017   Tobacco dependence 06/28/2017   Hyperglycemia 03/07/2017   Prediabetes 03/07/2017   Healthcare maintenance 11/04/2016   History of depression 06/05/2014   PVD (peripheral vascular disease) (HCC) 06/05/2014   HTN (hypertension), benign 02/22/2014   GERD (gastroesophageal reflux disease) 02/22/2014   COPD (chronic obstructive pulmonary disease) with emphysema (HCC) 02/21/2014    Palliative Care Assessment & Plan    Recommendations/Plan: Attending team has been able to speak with family and patient is currently comfort care and in the dying process.  Goals are set and PMT will sign off at this time.     Code Status:    Code Status Orders  (From admission, onward)           Start     Ordered   02/27/23 0635  Do not attempt resuscitation (DNR)  Continuous       Question Answer Comment  If patient has no pulse and is not breathing Do Not Attempt Resuscitation   If patient has a pulse and/or is breathing: Medical Treatment Goals LIMITED ADDITIONAL INTERVENTIONS: Use medication/IV fluids and cardiac monitoring as indicated; Do not use intubation or mechanical ventilation (DNI), also provide comfort medications.  Transfer to Progressive/Stepdown as indicated, avoid Intensive Care.   Consent: Discussion documented in EHR or advanced directives reviewed      02/27/23 [phone]  Code Status History      Date Active Date Inactive Code Status Order ID Comments User Context   02/22/2023 2023 02/27/2023 0634 Full Code [id number]  [facility] Vernetta Honey, MD ED   11/12/2019 2031 11/13/2019 1506 Full Code [id number]  Mansy, Vernetta Honey, MD ED       Prognosis:  Poor   Care plan was discussed with team Thank you for allowing the Palliative Medicine Team to assist in the care of this patient.  Morton Stall, NP  Please contact Palliative Medicine Team phone at [phone] for questions and concerns.

## 2023-03-31 NOTE — IPAL (Signed)
  Interdisciplinary Goals of Care Family Meeting   Date carried out: 03/28/2023  Location of the meeting: Bedside  Member's involved: Physician, Bedside Registered Nurse, and Family Member or next of kin  Durable Power of Attorney or acting medical decision maker: Brother, brother's wife and 2 other family members  Discussion: We discussed goals of care for Steve Gregory   Code status:   Code Status: DNR/comfort care  Disposition: In-patient comfort care  Time spent for the meeting: 35 minutes    [name], MD  03/18/2023, 2:03 PM

## 2023-03-31 NOTE — Progress Notes (Signed)
Pharmacy Antibiotic Note  Steve Gregory is a 67 y.o. male admitted on 02/02/2023 with possible aspiration pneumonia.  Pharmacy has been consulted for Unaysn dosing.  -pt w/ large stroke  Plan: Unasyn 3 gm q6hr for 5 days per indication & renal fxn.  Pharmacy will continue to follow and will adjust abx dosing as warranted.  Temp (24hrs), Avg:98.7 F (37.1 C), Min:98.1 F (36.7 C), Max:99 F (37.2 C)   Recent Labs  Lab 02/23/2023 1814 02/18/2023 2020 02/14/2023 2343 02/25/23 0156 02/26/23 0547 02/27/23 0458 02/28/23 0730 03/28/2023 0647  WBC 16.9*  --  15.0*  --  14.9* 14.4* 20.3* 21.1*  CREATININE 1.13  --  0.98  --  0.93 0.81  --  1.38*  LATICACIDVEN 1.1 2.7* 1.0 0.9  --   --   --   --      Estimated Creatinine Clearance: 52.7 mL/min (A) (by C-G formula based on SCr of 1.38 mg/dL (H)).    Allergies  Allergen Reactions   Chantix [Varenicline] Swelling        Lipitor [Atorvastatin] Swelling   Mobic [Meloxicam] Other (See Comments)    Myalgia   Statins Other (See Comments)    Myalgia    Antimicrobials this admission: 6/28 Azithromycin >> x 5 days 6/30 Unasyn >> x 5 days  Microbiology results: 6/27 BCx: 2 of 2 bottles w/ Bacillus & Staph hominis 6/28 BCx: NG x 2 days  Thank you for allowing pharmacy to be a part of this patient's care.  Bari Mantis PharmD Clinical Pharmacist 03/07/2023

## 2023-03-31 NOTE — Death Summary Note (Signed)
DEATH SUMMARY   Patient Details  Name: Steve Gregory MRN: [id number] DOB: May 17, 1956 [name], [name], MD Admission/Discharge Information   Admit Date:  March 14, 2023  Date of Death:  03/21/23  Time of Death:  6:35 pm  Length of Stay: 7   Principle Cause of death: Acute Stroke  Hospital Diagnoses: Principal Problem:   Acute cerebral infarction [facility]) Active Problems:   Sepsis due to undetermined organism (HCC)   Chronic obstructive pulmonary disease (COPD) (HCC)   GERD without esophagitis   History of deep venous thrombosis (DVT) of distal vein of left lower extremity   Peripheral neuropathy   Acute pulmonary embolism (HCC)   Right middle lobe pneumonia   Acute hypoxic respiratory failure (HCC)   Bacteremia   Hospital Course: Assessment and Plan: * Acute cerebral infarction (HCC) Acute MCA/ACA territory infarction with subsequent left-sided hemiplegia.   Sepsis due to  Right middle and lower lobe Aspiration pneumonia  CTA chest reveals PE, right sided pneumonia.  Due to aspiration.   1 Blood culture positive for Staph, likely contaminant    Acute hypoxic respiratory failure In the setting of aspiration pneumonia, PE, stroke.   Right-sided pulmonary embolism Hx of DVT GERD without esophagitis History of lung cancer status post SBRT Chronic obstructive pulmonary disease (COPD) (HCC)        Consultations: Neuro, Palliative care  The results of significant diagnostics from this hospitalization (including imaging, microbiology, ancillary and laboratory) are listed below for reference.   Significant Diagnostic Studies: DG Chest Port 1 View  Result Date: 02/27/2023 CLINICAL DATA:  67 year old male with history of dyspnea. EXAM: PORTABLE CHEST 1 VIEW COMPARISON:  Chest x-ray 03/14/2023. FINDINGS: Emphysematous changes are noted throughout the lungs. Marked worsening widespread interstitial prominence, peribronchial cuffing and patchy airspace disease  scattered throughout the lungs bilaterally, most severe throughout the right mid lung, concerning for developing multilobar bronchopneumonia. Small left pleural effusion. Chronic postradiation scarring in the left upper lobe near the apex, similar to the prior study. No pneumothorax. No definite pulmonary edema. Heart size is normal. IMPRESSION: 1. The appearance of the chest suggests severe bronchitis with progressive multilobar pneumonia, most severe in the right lung. 2. Small left pleural effusion. Electronically Signed   By: Trudie Reed M.D.   On: 02/27/2023 05:28   CT HEAD WO CONTRAST ( )  Result Date: 02/26/2023 CLINICAL DATA:  67 year old male neurologic deficit. Large right hemisphere infarcts, scattered small left hemisphere and bilateral cerebellar infarcts on MRI 2 days ago. EXAM: CT HEAD WITHOUT CONTRAST TECHNIQUE: Contiguous axial images were obtained from the base of the skull through the vertex without intravenous contrast. RADIATION DOSE REDUCTION: This exam was performed according to the departmental dose-optimization program which includes automated exposure control, adjustment of the mA and/or kV according to patient size and/or use of iterative reconstruction technique. COMPARISON:  Brain MRI 03-14-2023 and earlier. FINDINGS: Brain: Confluent cytotoxic edema in both the right ACA and majority of the right MCA territories. Intracranial mass effect, mostly at the level of the frontal horns with mild 5 mm midline shift of the anterior septum pellucidum there. This mass effect has progressed from the MRI 2 days ago. Other scattered cytotoxic edema in the bilateral cerebellar and left cerebral hemispheres does not appear significantly changed. No areas of malignant hemorrhagic transformation. Mass effect on the right lateral ventricle with no ventriculomegaly. Basilar cisterns remain patent. Vascular: Noncontributory. Skull: Intact. Sinuses/Orbits: Stable paranasal sinus mucosal  thickening. Tympanic cavities and mastoids remain clear. Other: No acute  orbit or scalp soft tissue finding. IMPRESSION: 1. Large Right ACA and MCA infarcts with confluent cytotoxic edema. Scattered additional small left hemisphere and bilateral cerebellar infarcts better demonstrated on recent MRI. 2. No malignant hemorrhagic transformation, but increased intracranial mass effect since 02/27/2023, now up to 5 mm of leftward midline shift at the anterior septum pellucidum. 3. Ventricular mass effect with no ventriculomegaly. Electronically Signed   By: Odessa Fleming M.D.   On: 02/26/2023 13:02   ECHOCARDIOGRAM COMPLETE BUBBLE STUDY  Result Date: 02/25/2023    ECHOCARDIOGRAM REPORT   Patient Name:   Steve Gregory [facility] Date of Exam: 02/25/2023 Medical Rec #:  [id number]     Height:       69.0 in Accession #:    [id number]    Weight:       158.6 lb Date of Birth:  November 04, 1955     BSA:          1.872 m Patient Age:    67 years      BP:           132/59 mmHg Patient Gender: M             HR:           85 bpm. Exam Location:  [facility] Procedure: 2D Echo, Cardiac Doppler, Color Doppler and Saline Contrast Bubble            Study Indications:     Stroke 434.91 / I63.9  History:         Patient has no prior history of Echocardiogram examinations.                  Risk Factors:Hypertension. History of emphysema.  Sonographer:     Cristela Blue Referring Phys:  [id number] JAN A MANSY Diagnosing Phys: Yvonne Kendall MD  Sonographer Comments: Suboptimal parasternal window. IMPRESSIONS  1. Left ventricular ejection fraction, by estimation, is 40 to 45%. The left ventricle has mildly decreased function. Left ventricular endocardial border not optimally defined to evaluate regional wall motion. Left ventricular diastolic parameters are consistent with Grade I diastolic dysfunction (impaired relaxation).  2. Right ventricular systolic function is normal. The right ventricular size is normal. Mildly increased right ventricular wall thickness.  3. The  mitral valve is grossly normal. Trivial mitral valve regurgitation.  4. The aortic valve was not well visualized. There is moderate calcification of the aortic valve. There is moderate thickening of the aortic valve. Aortic valve regurgitation is trivial. Aortic valve sclerosis/calcification is present, without any evidence of aortic stenosis.  5. Bubble study of limited quality but does not show a large intracardiac shunt. FINDINGS  Left Ventricle: Left ventricular ejection fraction, by estimation, is 40 to 45%. The left ventricle has mildly decreased function. Left ventricular endocardial border not optimally defined to evaluate regional wall motion. The left ventricular internal cavity size was normal in size. There is borderline left ventricular hypertrophy. Left ventricular diastolic parameters are consistent with Grade I diastolic dysfunction (impaired relaxation). Right Ventricle: The right ventricular size is normal. Mildly increased right ventricular wall thickness. Right ventricular systolic function is normal. Left Atrium: Left atrial size was normal in size. Right Atrium: Right atrial size was normal in size. Pericardium: The pericardium was not well visualized. Mitral Valve: The mitral valve is grossly normal. Trivial mitral valve regurgitation. MV peak gradient, 4.4 mmHg. The mean mitral valve gradient is 2.0 mmHg. Tricuspid Valve: The tricuspid valve is not well visualized. Tricuspid valve regurgitation is trivial. Aortic  Valve: The aortic valve was not well visualized. There is moderate calcification of the aortic valve. There is moderate thickening of the aortic valve. Aortic valve regurgitation is trivial. Aortic valve sclerosis/calcification is present, without  any evidence of aortic stenosis. Aortic valve mean gradient measures 5.7 mmHg. Aortic valve peak gradient measures 11.8 mmHg. Aortic valve area, by VTI measures 2.51 cm. Pulmonic Valve: The pulmonic valve was not well visualized. Pulmonic  valve regurgitation is not visualized. No evidence of pulmonic stenosis. Aorta: The aortic root is normal in size and structure. Pulmonary Artery: The pulmonary artery is of normal size. IAS/Shunts: The interatrial septum was not well visualized. Agitated saline contrast was given intravenously to evaluate for intracardiac shunting. Bubble study of limited quality but does not show a large intracardiac shunt.  LEFT VENTRICLE PLAX 2D LVIDd:         5.00 cm     Diastology LVIDs:         3.80 cm     LV e' medial:    8.81 cm/s LV PW:         1.10 cm     LV E/e' medial:  6.7 LV IVS:        0.80 cm     LV e' lateral:   7.94 cm/s LVOT diam:     2.30 cm     LV E/e' lateral: 7.5 LV SV:         61 LV SV Index:   33 LVOT Area:     4.15 cm  LV Volumes (MOD) LV vol d, MOD A2C: 61.7 ml LV vol d, MOD A4C: 93.5 ml LV vol s, MOD A2C: 40.8 ml LV vol s, MOD A4C: 60.3 ml LV SV MOD A2C:     20.9 ml LV SV MOD A4C:     93.5 ml LV SV MOD BP:      27.3 ml RIGHT VENTRICLE RV Basal diam:  2.75 cm RV S prime:     15.60 cm/s TAPSE (M-mode): 1.8 cm LEFT ATRIUM           Index       RIGHT ATRIUM           Index LA diam:      2.50 cm 1.34 cm/m  RA Area:     13.60 cm LA Vol (A2C): 13.4 ml 7.16 ml/m  RA Volume:   37.00 ml  19.76 ml/m LA Vol (A4C): 14.5 ml 7.75 ml/m  AORTIC VALVE AV Area (Vmax):    2.18 cm AV Area (Vmean):   2.49 cm AV Area (VTI):     2.51 cm AV Vmax:           172.00 cm/s AV Vmean:          106.467 cm/s AV VTI:            0.245 m AV Peak Grad:      11.8 mmHg AV Mean Grad:      5.7 mmHg LVOT Vmax:         90.20 cm/s LVOT Vmean:        63.700 cm/s LVOT VTI:          0.148 m LVOT/AV VTI ratio: 0.60  AORTA Ao Root diam: 3.30 cm MITRAL VALVE                TRICUSPID VALVE MV Area (PHT): 6.32 cm     TR Peak grad:   18.5 mmHg MV Area VTI:   6.08  cm     TR Vmax:        215.00 cm/s MV Peak grad:  4.4 mmHg MV Mean grad:  2.0 mmHg     SHUNTS MV Vmax:       1.05 m/s     Systemic VTI:  0.15 m MV Vmean:      61.1 cm/s    Systemic Diam:  2.30 cm MV Decel Time: 120 msec MV E velocity: 59.20 cm/s MV A velocity: 114.00 cm/s MV E/A ratio:  0.52 Cristal De[place] End MD Electronically signed by Yvonne Kendall MD Signature Date/Time: 02/25/2023/3:24:35 PM    Final    CT Angio Chest Pulmonary Embolism (PE) W or WO Contrast  Result Date: 02/25/2023 CLINICAL DATA:  67 year old male recently diagnosed with left lower extremity deep venous thrombosis, currently on Eliquis. Unresponsive and lethargic dictation. Evaluate for pulmonary embolism. Additional history of non-small cell lung cancer. * Tracking Code: BO * EXAM: CT ANGIOGRAPHY CHEST WITH CONTRAST TECHNIQUE: Multidetector CT imaging of the chest was performed using the standard protocol during bolus administration of intravenous contrast. Multiplanar CT image reconstructions and MIPs were obtained to evaluate the vascular anatomy. RADIATION DOSE REDUCTION: This exam was performed according to the departmental dose-optimization program which includes automated exposure control, adjustment of the mA and/or kV according to patient size and/or use of iterative reconstruction technique. CONTRAST:  75mL OMNIPAQUE IOHEXOL 350 MG/ML SOLN COMPARISON:  Multiple priors, most recently chest CT 11/08/2022. FINDINGS: Cardiovascular: Filling defects are noted in the right lower lobe pulmonary arterial tree involving segmental and subsegmental sized branches, indicative of pulmonary embolism. This appears nonocclusive at this time. Estimated right ventricular diameter of 47 mm. Estimated left ventricular diameter of 52 mm. RV to LV ratio of 0.90. Heart size is within normal limits. There is no significant pericardial fluid, thickening or pericardial calcification. There is aortic atherosclerosis, as well as atherosclerosis of the great vessels of the mediastinum and the coronary arteries, including calcified atherosclerotic plaque in the left main and left anterior descending coronary arteries. Calcifications and  thickening of the aortic valve. Mediastinum/Nodes: Right hilar lymphadenopathy measuring up to 1.9 cm (axial image 196 of series 6). Low right paratracheal lymphadenopathy measuring 1.8 cm in short axis (axial image 167 of series 6). Multiple other prominent borderline enlarged mediastinal lymph nodes are also noted. Esophagus is unremarkable in appearance. No axillary lymphadenopathy. Lungs/Pleura: Widespread septal thickening in scattered areas of nodular and mass-like architectural distortion throughout the left upper lobe, similar to the prior examination, most compatible with areas of chronic postradiation fibrosis. Patchy areas of airspace consolidation noted in the right lung, most evident in the right middle lobe and dependent portions of the right lower lobe, increased compared to the prior study, now obscuring the previously noted small right lower lobe pulmonary nodule seen on the prior examination. No pleural effusions. Diffuse bronchial wall thickening with moderate centrilobular and paraseptal emphysema. Upper Abdomen: Aortic atherosclerosis. Musculoskeletal: Destructive changes and chronic fractures of the lateral aspect of the left second and third ribs adjacent to the site of prior radiation therapy, unchanged. No new aggressive appearing lytic or blastic lesions are noted elsewhere in the visualized portions of the axial or appendicular skeleton. Review of the MIP images confirms the above findings. IMPRESSION: 1. Study is positive for nonocclusive segmental and subsegmental sized pulmonary embolism to the right lower lobe. No findings of right heart strain noted at this time. 2. Increasing lymphadenopathy in the mediastinal and right hilar nodal stations concerning for potential metastatic  disease in this patient with history of lung cancer. Repeat PET-CT should be considered if clinically appropriate. 3. Increasing areas of airspace consolidation in the right lung, most severe in the right middle  and lower lobes concerning for probable pneumonia. 4. Diffuse bronchial wall thickening with moderate centrilobular and paraseptal emphysema; imaging findings suggestive of underlying COPD. 5. Aortic atherosclerosis, in addition to left main and left anterior descending coronary artery disease. Please note that although the presence of coronary artery calcium documents the presence of coronary artery disease, the severity of this disease and any potential stenosis cannot be assessed on this non-gated CT examination. Assessment for potential risk factor modification, dietary therapy or pharmacologic therapy may be warranted, if clinically indicated. 6. There is thickening and calcification of the aortic valve. Echocardiographic correlation for evaluation of potential valvular dysfunction may be warranted if clinically indicated. These results will be called to the ordering clinician or representative by the Radiologist Assistant, and communication documented in the PACS or [employer]. Aortic Atherosclerosis (ICD10-I70.0) and Emphysema (ICD10-J43.9). Electronically Signed   By: Trudie Reed M.D.   On: 02/25/2023 08:43   MR BRAIN WO CONTRAST  Result Date: 02/18/2023 CLINICAL DATA:  Initial evaluation for neuro deficit, stroke. EXAM: MRI HEAD WITHOUT CONTRAST TECHNIQUE: Multiplanar, multiecho pulse sequences of the brain and surrounding structures were obtained without intravenous contrast. COMPARISON:  Prior studies from earlier the same day. FINDINGS: Brain: Mild age-related cerebral atrophy. Patchy T2/FLAIR hyperintensity involving the supratentorial cerebral white matter, consistent with chronic small vessel ischemic disease, moderately advanced. Patchy and confluent restricted diffusion involving the right cerebral hemisphere, right ACA and MCA distributions, consistent with large evolving acute ischemic infarcts. Additional patchy small volume ischemic infarcts noted involving the contralateral left  cerebral hemisphere. Patchy involvement of the right basal ganglia and left thalamus. Scattered acute ischemic infarcts seen involving the cerebellar hemispheres bilaterally. No associated hemorrhage or mass effect. Gray-white matter differentiation otherwise maintained. No other areas of chronic cortical infarction. No acute or chronic intracranial blood products. No mass lesion, midline shift or mass effect. No hydrocephalus or extra-axial fluid collection. Pituitary gland suprasellar region within normal limits. Vascular: Loss of normal flow void within the right ICA to the siphon, consistent with previously identified occlusion. Major intracranial vascular flow voids are otherwise maintained. Skull and upper cervical spine: Craniocervical junction within normal limits. Bone marrow signal intensity normal. No scalp soft tissue abnormality. Sinuses/Orbits: Prior bilateral ocular lens replacement. Mild chronic right maxillary sinusitis. Trace right mastoid effusion, of doubtful significance. Other: None. IMPRESSION: 1. Large evolving right ACA and MCA distribution infarcts. Additional patchy smaller volume ischemic infarcts involving the contralateral left cerebral hemisphere, left thalamus, and bilateral cerebellar hemispheres. No associated hemorrhage or mass effect. 2. Loss of normal flow void within the right ICA to the siphon, consistent with previously identified occlusion. 3. Underlying age-related cerebral atrophy with moderate chronic small vessel ischemic disease. Electronically Signed   By: Rise Mu M.D.   On: 02/20/2023 21:42   CT ANGIO HEAD NECK W WO CM  Result Date: 02/18/2023 CLINICAL DATA:  Initial evaluation for neuro deficit, stroke. EXAM: CT ANGIOGRAPHY HEAD AND NECK WITH AND WITHOUT CONTRAST TECHNIQUE: Multidetector CT imaging of the head and neck was performed using the standard protocol during bolus administration of intravenous contrast. Multiplanar CT image reconstructions  and MIPs were obtained to evaluate the vascular anatomy. Carotid stenosis measurements (when applicable) are obtained utilizing NASCET criteria, using the distal internal carotid diameter as the denominator. RADIATION DOSE REDUCTION:  This exam was performed according to the departmental dose-optimization program which includes automated exposure control, adjustment of the mA and/or kV according to patient size and/or use of iterative reconstruction technique. CONTRAST:  75mL OMNIPAQUE IOHEXOL 350 MG/ML SOLN COMPARISON:  Prior CT from earlier the same day. FINDINGS: CTA NECK FINDINGS Aortic arch: Visualized aortic arch normal in caliber with normal branch pattern. Mild atheromatous change about the arch itself. No stenosis about the origin the great vessels. Right carotid system: Right common carotid artery patent to the bifurcation without significant stenosis. There is occlusion of the right ICA just distal to the bifurcation, likely acute (series 4, image 101). Right ICA remains occluded within the neck. Left carotid system: Left common and internal carotid arteries are patent without dissection. Mild atheromatous change about the left carotid artery system without hemodynamically significant greater than 50% stenosis. Vertebral arteries: Both vertebral arteries arise from subclavian arteries. No proximal subclavian artery stenosis. Left vertebral artery dominant. Vertebral arteries are patent without stenosis or dissection. Skeleton: No discrete or worrisome osseous lesions. Other neck: No other acute soft tissue abnormality within the neck. Upper chest: Severe emphysema. Irregular post treatment changes within the left upper lobe, similar to prior CT. Superimposed hazy and patchy opacities favored to reflect edema and/or atelectasis. Enlarged 1.7 cm right pretracheal node (series 4, image 177) 1.3 cm subcarinal node (series 4, image 196). 1.3 cm right hilar node ((series 4, image 192). Review of the MIP images  confirms the above findings CTA HEAD FINDINGS Anterior circulation: Scattered atheromatous change within the left carotid siphon without hemodynamically significant stenosis. Distal reconstitution of the right ICA via the ophthalmic artery. Atheromatous change within the right siphon without high-grade stenosis. Left A1 segment patent. Right A1 attenuated but patent proximally. Suspected filling defect involving the anterior communicating artery complex and proximal right A2 segment (series 6, images 106, 105). Right ACA is attenuated but patent distally. Left ACA widely patent. Right M1 segment attenuated but patent. No visible right MCA branch occlusion or high-grade stenosis. Left M1 segment patent without stenosis. 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 Distal left MCA branches are well perfused. Posterior circulation: Both V4 segments patent without stenosis. Both PICA patent. Basilar patent without stenosis. Superior cerebellar arteries patent bilaterally. Both PCAs primarily supplied via the basilar. Mild atheromatous irregularity about the PCAs bilaterally without high-grade stenosis. Venous sinuses: Patent allowing for timing the contrast bolus. Anatomic variants: As above. Large evolving right MCA/ACA distribution infarct noted. Review of the MIP images confirms the above findings IMPRESSION: 1. 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 Distal reconstitution via the ophthalmic artery. Right MCA and its distal branches are attenuated but remain patent. 2. Focal filling defects involving the anterior communicating artery complex and proximal right A2 segment, likely subocclusive thrombus. Right ACA is attenuated but patent distally. 3. Large evolving right MCA/ACA distribution infarct. 4. 6 mm left MCA bifurcation aneurysm. 5. Severe emphysema with irregular post treatment changes within the left upper lobe, similar to prior CT.  Superimposed hazy and patchy opacities favored to reflect edema and/or atelectasis. 6. Enlarged mediastinal and right hilar adenopathy as above. Aortic Atherosclerosis (ICD10-I70.0) and Emphysema (ICD10-J43.9). Critical Value/emergent results were called by telephone at the time of interpretation on 02/18/2023 at 9:15 pm to provider [facility] , who verbally acknowledged these results. Electronically Signed   By: Rise Mu M.D.   On: 02/01/2023 21:22   CT Head Wo Contrast  Result Date: 02/27/2023 CLINICAL DATA:  Altered mental status. Incoherent speech with left-sided weakness and right-sided gaze. EXAM: CT HEAD WITHOUT CONTRAST TECHNIQUE: Contiguous axial images were obtained from the base of the skull through the vertex without intravenous contrast. RADIATION DOSE REDUCTION: This exam was performed according to the departmental dose-optimization program which includes automated exposure control, adjustment of the mA and/or kV according to patient size and/or use of iterative reconstruction technique. COMPARISON:  CT head dated October 08, 2021. FINDINGS: Brain: Loss of the normal gray-white matter differentiation in the right MCA territory involving the frontal, parietal, and occipital lobes, concerning for acute infarct. There are also new areas of hypodensity in the left cerebellum concerning for age-indeterminate lacunar infarcts. No evidence of hemorrhage, hydrocephalus, extra-axial collection or mass lesion/mass effect. Stable mild chronic microvascular ischemic changes. Vascular: Atherosclerotic vascular calcification of the carotid siphons. No hyperdense vessel. Skull: Normal. Negative for fracture or focal lesion. Sinuses/Orbits: New mild mucosal thickening with small air-fluid level in the right maxillary sinus. The orbits are unremarkable. Other: None. IMPRESSION: 1. Findings concerning for large right MCA territory acute infarct. No hemorrhage or mass effect. 2. New areas of  hypodensity in the left cerebellum concerning for age-indeterminate lacunar infarcts. Electronically Signed   By: Obie Dredge M.D.   On: 02/17/2023 19:09   DG Chest Port 1 View  Result Date: 02/16/2023 CLINICAL DATA:  Sepsis.  History of lung cancer. EXAM: PORTABLE CHEST 1 VIEW COMPARISON:  November 12, 2019.  November 08, 2022. FINDINGS: The heart size and mediastinal contours are within normal limits. Stable radiation changes are seen involving both upper lobes. Emphysematous disease is again noted. No definite acute abnormality is noted. The visualized skeletal structures are unremarkable. IMPRESSION: Stable radiation changes involving both upper lobes. No definite acute abnormality is noted. Emphysema (ICD10-J43.9). Electronically Signed   By: Lupita Raider M.D.   On: 02/14/2023 18:41   US Venous Img Lower Bilateral (DVT)  Result Date: 02/14/2023 CLINICAL DATA:  Bilateral calf pain. EXAM: BILATERAL LOWER EXTREMITY VENOUS DOPPLER ULTRASOUND TECHNIQUE: Gray-scale sonography with graded compression, as well as color Doppler and duplex ultrasound were performed to evaluate the lower extremity deep venous systems from the level of the common femoral vein and including the common femoral, femoral, profunda femoral, popliteal and calf veins including the posterior tibial, peroneal and gastrocnemius veins when visible. The superficial great saphenous vein was also interrogated. Spectral Doppler was utilized to evaluate flow at rest and with distal augmentation maneuvers in the common femoral, femoral and popliteal veins. COMPARISON:  None Available. FINDINGS: RIGHT LOWER EXTREMITY Common Femoral Vein: No evidence of thrombus. Normal compressibility, respiratory phasicity and response to augmentation. Saphenofemoral Junction: No evidence of thrombus. Normal compressibility and flow on color Doppler imaging. Profunda Femoral Vein: No evidence of thrombus. Normal compressibility and flow on color Doppler imaging.  Femoral Vein: Occlusive thrombus present. Popliteal Vein: Nonocclusive thrombus in the proximal popliteal vein and essentially occlusive thrombus in the distal popliteal vein. Calf Veins: Thrombus in the visualized posterior tibial and peroneal veins. Superficial Great Saphenous Vein: No evidence of thrombus. Normal compressibility. Venous Reflux:  None. Other Findings: No evidence of superficial thrombophlebitis or abnormal fluid collection. LEFT LOWER EXTREMITY Common Femoral Vein: Nonocclusive thrombus present. Saphenofemoral Junction: Thrombus at the saphenofemoral junction. Profunda Femoral Vein: No evidence of thrombus. Normal compressibility and flow on color Doppler imaging. Femoral Vein: No evidence of thrombus. Normal compressibility, respiratory phasicity and response to augmentation. Popliteal Vein: No evidence of thrombus. Normal compressibility,  respiratory phasicity and response to augmentation. Calf Veins: The left posterior tibial vein is spared and 1 of the 2 demonstrates probable thrombus. The peroneal vein is normally patent. Superficial Great Saphenous Vein: Thrombus extends into the upper left GSV through the saphenofemoral junction. Venous Reflux:  None. Other Findings: No evidence of superficial thrombophlebitis or abnormal fluid collection. IMPRESSION: 1. Positive for DVT in the right lower extremity with occlusive thrombus in the femoral vein, nonocclusive thrombus in the proximal popliteal vein and essentially occlusive thrombus in the distal popliteal vein. Thrombus also present in the posterior tibial and peroneal veins of the right calf. 2. Positive for DVT in the left lower extremity with nonocclusive thrombus in the common femoral vein and thrombus extending into the upper left GSV through the saphenofemoral junction. Thrombus also present in 1 of 2 left posterior tibial veins. Electronically Signed   By: Irish Lack M.D.   On: 02/14/2023 15:49    Microbiology: Recent Results  (from the past 240 hour(s))  Blood Culture (routine x 2)     Status: None   Collection Time: 02/19/2023  6:14 PM   Specimen: BLOOD  Result Value Ref Range Status   Specimen Description BLOOD BLOOD LEFT HAND  Final   Special Requests   Final    BOTTLES DRAWN AEROBIC AND ANAEROBIC Blood Culture adequate volume   Culture   Final    NO GROWTH 5 DAYS Performed at [facility], [address] [place], [place] [address]    Report Status 03/01/2023 FINAL  Final  Blood Culture (routine x 2)     Status: Abnormal   Collection Time: 02/01/2023  6:14 PM   Specimen: BLOOD  Result Value Ref Range Status   Specimen Description   Final    BLOOD BLOOD RIGHT HAND Performed at [facility], [address] [place], [place] [address]    Special Requests   Final    BOTTLES DRAWN AEROBIC AND ANAEROBIC BLOOD RIGHT HAND Performed at [facility], [address] [place], [place] [address]    Culture  Setup Time   Final    GRAM POSITIVE RODS ANAEROBIC BOTTLE ONLY CRITICAL RESULT CALLED TO, READ BACK BY AND VERIFIED WITH: NATHAN BELEUE @0337  ON 02/25/23 SK[facility] AEROBIC BOTTLE ONLY GRAM POSITIVE COCCI CRITICAL RESULT CALLED TO, READ BACK BY AND VERIFIED WITH: [facility] MADUEME AT 1300 02/25/23 JG Performed at [facility], [address] [place], [place] [address]    Culture (A)  Final    BACILLUS SPECIES STAPHYLOCOCCUS HOMINIS THE SIGNIFICANCE OF ISOLATING THIS ORGANISM FROM A SINGLE SET OF BLOOD CULTURES WHEN MULTIPLE SETS ARE DRAWN IS UNCERTAIN. PLEASE NOTIFY THE MICROBIOLOGY DEPARTMENT WITHIN ONE WEEK IF SPECIATION AND SENSITIVITIES ARE REQUIRED. Standardized susceptibility testing for this organism is not available. FOR BACILLUS SPECIES    Report Status 02/27/2023 FINAL  Final  Blood Culture ID Panel (Reflexed)     Status: Abnormal   Collection Time: 02/09/2023  6:14 PM  Result Value Ref Range Status   Enterococcus faecalis NOT DETECTED NOT DETECTED Final   Enterococcus  Faecium NOT DETECTED NOT DETECTED Final   Listeria monocytogenes NOT DETECTED NOT DETECTED Final   Staphylococcus species DETECTED (A) NOT DETECTED Final    Comment: CRITICAL RESULT CALLED TO, READ BACK BY AND VERIFIED WITH:  ELVIRA MADUEME AT 1300 02/25/23 JG    Staphylococcus aureus (BCID) NOT DETECTED NOT DETECTED Final   Staphylococcus epidermidis NOT DETECTED NOT DETECTED Final   Staphylococcus lugdunensis NOT  DETECTED NOT DETECTED Final   Streptococcus species NOT DETECTED NOT DETECTED Final   Streptococcus agalactiae NOT DETECTED NOT DETECTED Final   Streptococcus pneumoniae NOT DETECTED NOT DETECTED Final   Streptococcus pyogenes NOT DETECTED NOT DETECTED Final   A.calcoaceticus-baumannii NOT DETECTED NOT DETECTED Final   Bacteroides fragilis NOT DETECTED NOT DETECTED Final   Enterobacterales NOT DETECTED NOT DETECTED Final   Enterobacter cloacae complex NOT DETECTED NOT DETECTED Final   Escherichia coli NOT DETECTED NOT DETECTED Final   Klebsiella aerogenes NOT DETECTED NOT DETECTED Final   Klebsiella oxytoca NOT DETECTED NOT DETECTED Final   Klebsiella pneumoniae NOT DETECTED NOT DETECTED Final   Proteus species NOT DETECTED NOT DETECTED Final   Salmonella species NOT DETECTED NOT DETECTED Final   Serratia marcescens NOT DETECTED NOT DETECTED Final   Haemophilus influenzae NOT DETECTED NOT DETECTED Final   Neisseria meningitidis NOT DETECTED NOT DETECTED Final   Pseudomonas aeruginosa NOT DETECTED NOT DETECTED Final   Stenotrophomonas maltophilia NOT DETECTED NOT DETECTED Final   Candida albicans NOT DETECTED NOT DETECTED Final   Candida auris NOT DETECTED NOT DETECTED Final   Candida glabrata NOT DETECTED NOT DETECTED Final   Candida krusei NOT DETECTED NOT DETECTED Final   Candida parapsilosis NOT DETECTED NOT DETECTED Final   Candida tropicalis NOT DETECTED NOT DETECTED Final   Cryptococcus neoformans/gattii NOT DETECTED NOT DETECTED Final    Comment: Performed at  [facility], [address] [place], [place] [address]  SARS Coronavirus 2 by RT PCR (hospital order, performed in [facility] hospital lab) *cepheid single result test* Anterior Nasal Swab     Status: None   Collection Time: 02/12/2023  8:57 PM   Specimen: Anterior Nasal Swab  Result Value Ref Range Status   SARS Coronavirus 2 by RT PCR NEGATIVE NEGATIVE Final    Comment: (NOTE) SARS-CoV-2 target nucleic acids are NOT DETECTED.  The SARS-CoV-2 RNA is generally detectable in upper and lower respiratory specimens during the acute phase of infection. The lowest concentration of SARS-CoV-2 viral copies this assay can detect is 250 copies / mL. A negative result does not preclude SARS-CoV-2 infection and should not be used as the sole basis for treatment or other patient management decisions.  A negative result may occur with improper specimen collection / handling, submission of specimen other than nasopharyngeal swab, presence of viral mutation(s) within the areas targeted by this assay, and inadequate number of viral copies (<250 copies / mL). A negative result must be combined with clinical observations, patient history, and epidemiological information.  Fact Sheet for Patients:   RoadLapTop.co.za  Fact Sheet for Healthcare Providers: http://kim-miller.com/  This test is not yet approved or  cleared by the [place] FDA and has been authorized for detection and/or diagnosis of SARS-CoV-2 by FDA under an Emergency Use Authorization (EUA).  This EUA will remain in effect (meaning this test can be used) for the duration of the COVID-19 declaration under Section 564(b)(1) of the Act, 21 U.S.C. section 360bbb-3(b)(1), unless the authorization is terminated or revoked sooner.  Performed at [facility], [address] [place], [place] [address]   Culture, blood (Routine X 2) w Reflex to ID Panel     Status: None    Collection Time: 02/25/23  2:02 PM   Specimen: BLOOD  Result Value Ref Range Status   Specimen Description BLOOD LEFT ANTECUBITAL  Final   Special Requests   Final    BOTTLES DRAWN AEROBIC AND ANAEROBIC Blood Culture  results may not be optimal due to an excessive volume of blood received in culture bottles   Culture   Final    NO GROWTH 5 DAYS Performed at [facility], [address] [place], [place] [address]    Report Status 03/02/2023 FINAL  Final  Culture, blood (Routine X 2) w Reflex to ID Panel     Status: None   Collection Time: 02/25/23  2:57 PM   Specimen: BLOOD LEFT HAND  Result Value Ref Range Status   Specimen Description BLOOD LEFT HAND  Final   Special Requests   Final    BOTTLES DRAWN AEROBIC AND ANAEROBIC Blood Culture adequate volume   Culture   Final    NO GROWTH 5 DAYS Performed at [facility], [address], [place], [place] [address]    Report Status 03/02/2023 FINAL  Final    Time spent: 15 minutes  Signed: [name], MD 03/04/2023

## 2023-03-31 NOTE — Progress Notes (Signed)
PROGRESS NOTE    Steve Gregory  ZOX:[id number] DOB: 06/08/1956 DOA: 02/08/2023 PCP: Lauro Regulus, MD  124A/[id number]-AA  LOS: 7 days   Brief hospital course:   Assessment & Plan: Steve Gregory is a 67 y.o. Caucasian male with medical history significant for anxiety, depression, GERD, essential hypertension, emphysema, peripheral vascular disease, Barrett's esophagus, anxiety and osteoarthritis, who presented to the emergency room with acute onset of left-sided weakness with expressive dysphasia of questionable onset.  The patient's brother stated that on Tuesday he told him he is eyes were crossed and he had double vision.  He was recently diagnosed with left lower extremity DVT and placed on Eliquis.    Noncontrasted head CT scan showed large right MCA territory acute infarction with no hemorrhage or mass effect. 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 Focal filling defects involving the anterior communicating artery complex and proximal right A2 segment, likely subocclusive thrombus. Large evolving right MCA/ACA distribution infarct. MRI brain confirmed the findings. CTA chest showed - segmental and subsegmental PE to the right lower lobe, increasing mediastinal lymphadenopathy and right hilar nodal stations concerning for potential metastatic disease, increasing areas of airspace consolidation right lung most severe in right middle and lower lobes concerning for pneumonia, diffuse bronchial wall thickening, aortic atherosclerosis and calcifications of attic valve.   Patient has severe respiratory distress requiring 6-10L HFNC oxygen. Unable to swallow, unable to clear secretions, high risk for aspiration. I discussed poor prognosis with family. Brother agreed to give only small doses of opiates for comfort but wants to continue current care for 2 more days. I did say that he may not improve, but brother is willing to take a decision at that  time.  7/4: Patient is actively dying.  Family meeting held at bedside.  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   Sepsis due to  Right middle and lower lobe Aspiration pneumonia  CTA chest reveals PE, right sided pneumonia.  Due to aspiration.  1 Blood culture positive for Staph, likely contaminant    Acute hypoxic respiratory failure In the setting of aspiration pneumonia, PE, stroke.   Right-sided pulmonary embolism Hx of DVT GERD without esophagitis History of lung cancer status post SBRT Chronic obstructive pulmonary disease (COPD) (HCC)   Full comfort care for now.  Patient is actively dying.  Not stable enough to be transported.   DVT prophylaxis: Comfort care Code Status: DNR  Family Communication: brother and other family updated at bedside today Dispo:   The patient is from: home Anticipated d/c is to: undetermined Anticipated d/c date is: undetermined   Subjective and Interval History:  Pt obtunded during rounding.  Actively dying   Objective: Vitals:   03/23/2023 0436 03/25/2023 0530 03/07/2023 0844 03/20/2023 0857  BP: (!) 151/92 (!) 143/82 (!) 147/84   Pulse: (!) 117 (!) 112 (!) 110   Resp: 20 16    Temp: 98.6 F (37 C) 98.9 F (37.2 C)    TempSrc:  Oral    SpO2: 93% 92%  91%  Weight:      Height:        Intake/Output Summary (Last 24 hours) at 03/02/2023 1404 Last data filed at 03/26/2023 1103 Gross per 24 hour  Intake 2354.71 ml  Output --  Net 2354.71 ml    Filed Weights   02/09/2023 1823  Weight: 71.9 kg    Examination:   Constitutional: obtunded CV: No cyanosis.   RESP: Cheyne-Stokes respiration and gasping SKIN: warm, dry   Data Reviewed: I have personally reviewed labs and imaging studies  Time spent: 25 minutes  Mani Celestin Sherryll Burger, MD [facility] If 7PM-7AM, please contact night-coverage 03/30/2023, 2:04 PM

## 2023-03-31 NOTE — Progress Notes (Signed)
OT Cancellation Note  Patient Details Name: Steve Gregory MRN: [id number] DOB: 1955/09/02   Cancelled Treatment:    Reason Eval/Treat Not Completed: Other (comment) (per chart pt has transitioned to comfort care, OT will sign off at this time)  Oleta Mouse, OTD OTR/L  03/04/2023, 2:16 PM

## 2023-03-31 NOTE — Progress Notes (Signed)
SLP Cancellation Note  Patient Details Name: Steve Gregory MRN: [id number] DOB: Jan 26, 1956   Cancelled treatment:       Reason Eval/Treat Not Completed: Medical issues which prohibited therapy;Patient not medically ready (chart reviewed; consulted NSG, MD)  Reviewed chart and MD notes re: pt's status. He continues to present w/ significant respiratory decline w/ need for increased O2 support: "Change from HFNC to NRB goal sats 88-92% - wean as tolerated; Additional 0.4 robinul now". Per notes last night, pt is having "audible airway secretions without clearing"; "weak cough and gag" w/ "mild increased WOB".    Oral intake is NOT recommended at this time d/t PROFOUND risk for aspiration and further pulmonary decline. Recommendations given to NSG for frequent oral care for hygiene and stimulation of swallowing and comfort as tolerates.   Pt is not appropriate for any cognitive-communication assessment at this time in setting of medical status/decline. Any such exam would be recommended when pt is medically stable and can comfortably/readily participate.   Noted Palliative Care and MD f/u w/ Family/pt re: GOC. MD can reconsult ST services if medical status improves to warrant skilled services, oral intake. mD/NSG updated/agreed.       Jerilynn Som, MS, CCC-SLP Speech Language Pathologist Rehab Services; [facility] [phone] (ascom) [name] 03/15/2023, 9:00 AM

## 2023-03-31 NOTE — Progress Notes (Signed)
ANTICOAGULATION CONSULT NOTE  Pharmacy Consult for Heparin Indication: pulmonary embolus and stroke  Allergies  Allergen Reactions   Chantix [Varenicline] Swelling        Lipitor [Atorvastatin] Swelling   Mobic [Meloxicam] Other (See Comments)    Myalgia   Statins Other (See Comments)    Myalgia    Patient Measurements: Height: 5\' 9"  (175.3 cm) Weight: 71.9 kg (158 lb 9.6 oz) IBW/kg (Calculated) : 70.7 Heparin Dosing Weight: 71.9 kg  Vital Signs: Temp: 98.9 F (37.2 C) (07/04 0530) Temp Source: Oral (07/04 0530) BP: 143/82 (07/04 0530) Pulse Rate: 112 (07/04 0530)  Labs: Recent Labs    02/28/23 0730 03/01/23 0705 03/02/23 0549 03/07/2023 0647  HGB 11.2*  --   --  10.4*  HCT 35.7*  --   --  34.8*  PLT 206  --   --  252  APTT 67*  --   --   --   HEPARINUNFRC 0.48 0.61 0.48 0.52  CREATININE  --   --   --  1.38*    Estimated Creatinine Clearance: 52.7 mL/min (A) (by C-G formula based on SCr of 1.38 mg/dL (H)).  Medical History: Past Medical History:  Diagnosis Date   Allergy    Anxiety    Arthritis    Barrett's esophagus    Bronchitis, chronic (HCC)    Cancer (HCC)    LUNG   COPD (chronic obstructive pulmonary disease) (HCC)    Cough    CHRONIC   Depression    Dizziness    DVT (deep venous thrombosis) (HCC)    Dysphagia    Essential hypertension 06/28/2017   GERD (gastroesophageal reflux disease)    H/O emphysema (HCC)    Headache    History of depression 06/05/2014   Last Assessment & Plan:  Mood is doing well on medications.    History of hiatal hernia    Hoarseness of voice    partial paralyzed vocal cord   HOH (hard of hearing)    Hypertension    Lung mass    Maxillary fracture (HCC)    LEFT SIDE   Orthopnea    Oxygen decrease    H/O HOME USE, NOT NOW   Personal history of colonic polyps    Pneumonia    PSEUDOMONAL PNEUMONIA IN THE PAST   PVD (peripheral vascular disease) (HCC) 06/05/2014   Overview:  Smoker with poor pulses  Last  Assessment & Plan:  No leg pain is noted of late.    Renal failure    PRERENAL   Seizures (HCC)    sounds like it could be vagal at times, coughing can cause him to fade out and see white lights. had last sz 3 months ago when not taking meds   Shortness of breath dyspnea    uses many different inhalers   Squamous cell carcinoma of skin 01/07/2020   right mid volar forearm, mid back spinal   Varicose veins of both lower extremities with pain 06/28/2017   Wheezing    Assessment: Steve Gregory is a 67 y.o. male presenting with acute onset of left-sided weakness with expressive dysphasia of questionable onset. PMH significant for anxiety, depression, GERD, HTN, emphysema, PVD, Barrett's esophagus, anxiety, OA. Patient was on apixaban 5 mg BID PTA with last dose thought to be earlier this week. He was recently diagnosed with left lower extremity DVT and placed on Eliquis. 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 He was on anticoagulation at home. Per neurology, given the large size of the stroke, anticoagulation puts him at risk of hemorrhagic conversion but if that has to be used for PE, and recommended using heparin drip stroke protocol with lower HL/aPTT goals. Pharmacy has been consulted to initiate and manage heparin infusion.   Baseline Labs: aPTT 41, PT 18.4, INR 1.5, Hgb 9.9, Hct 31.6, Plt 122   Goal of Therapy:  Heparin level 03.-0.5 units/ml aPTT 66-85 seconds Monitor platelets by anticoagulation protocol: Yes   Date Time aPTT/HL Rate/Comment  6/28 2001 48/0.64 1200/aPTT subtherapeutic 6/29 0104 71/0.67 1350/aPTT therapeutic x 1 6/30 0458 75/0.42 1350/aPTT therapeutic x 2 07/01 0730 67/0.48  Therapeutic, Continue 1350 un/hr 07/02 0705 --/ 0.61  Therapeutic 7/3 0549 --/0.48  Therapeutic 7/4 0647 --/0.52  Therapeutic  Plan:  No boluses per MD Continue heparin infusion at  1350 units/hr 7/4 0647  HL=0.52   Therapeutic- at upper end of goal Will continue to monitor HL Hgb and plt stable Continue to monitor H&H and platelets daily while on heparin infusion    Steve Gregory PharmD Clinical Pharmacist 03/04/2023

## 2023-03-31 NOTE — Progress Notes (Signed)
       CROSS COVER NOTE  NAME: Steve Gregory MRN: [id number] DOB : 1956-07-29    Concern as stated by nurse / staff   Decreased sats 83-84%     Pertinent findings on chart review: Patient with large left/cerebellar infarct . History of lung cancer treated with rtdiation leading to fibrosis with underlying COPD. Prior to presenting to ED with Stroke, patient was recently put on eliquis for DVT. CTA chest also showed non occlusive PE and enlarged lymph nodes concerning for return of malignancy  Discussion with his brother 6/30 DNR code status determined. Per daily note yesterday his brother wants to continue treatment for 2 more days and then decide on comfort care if no improvement  Assessment and  Interventions   Assessment: Audible airway secretions without clearing with oral suction. Poor weak cough and gag. Mild increase in work of breathing RT at bedside for NT suctioning. Unable to obtain anything segnificant Plan: Change from HFNC to NRB goal sats 88-92% - wean as tolerated Additional 0.4 robinul now Notify provider if no improvement in work of breathing or has mental status decline      [name] NP [facility] Cross Cover 7pm-7am - check amion for availability Pager [phone]

## 2023-03-31 DEATH — deceased
